# Patient Record
Sex: Female | Born: 1970 | Race: Black or African American | Hispanic: No | Marital: Married | Smoking: Never smoker
Health system: Southern US, Community
[De-identification: ages and names within clinical notes are randomized; demographics above are authoritative.]

## PROBLEM LIST (undated history)

## (undated) DIAGNOSIS — D259 Leiomyoma of uterus, unspecified: Secondary | ICD-10-CM

## (undated) DIAGNOSIS — F419 Anxiety disorder, unspecified: Secondary | ICD-10-CM

## (undated) DIAGNOSIS — F32A Depression, unspecified: Secondary | ICD-10-CM

## (undated) DIAGNOSIS — M7551 Bursitis of right shoulder: Secondary | ICD-10-CM

## (undated) DIAGNOSIS — G43909 Migraine, unspecified, not intractable, without status migrainosus: Secondary | ICD-10-CM

## (undated) DIAGNOSIS — I10 Essential (primary) hypertension: Secondary | ICD-10-CM

## (undated) DIAGNOSIS — R7989 Other specified abnormal findings of blood chemistry: Secondary | ICD-10-CM

## (undated) DIAGNOSIS — F329 Major depressive disorder, single episode, unspecified: Secondary | ICD-10-CM

## (undated) DIAGNOSIS — M539 Dorsopathy, unspecified: Secondary | ICD-10-CM

## (undated) DIAGNOSIS — R7309 Other abnormal glucose: Secondary | ICD-10-CM

## (undated) HISTORY — DX: Bursitis of right shoulder: M75.51

## (undated) HISTORY — DX: Essential (primary) hypertension: I10

## (undated) HISTORY — DX: Dorsopathy, unspecified: M53.9

## (undated) HISTORY — DX: Leiomyoma of uterus, unspecified: D25.9

## (undated) HISTORY — DX: Other specified abnormal findings of blood chemistry: R79.89

## (undated) HISTORY — DX: Depression, unspecified: F32.A

## (undated) HISTORY — DX: Anxiety disorder, unspecified: F41.9

## (undated) HISTORY — DX: Migraine, unspecified, not intractable, without status migrainosus: G43.909

## (undated) HISTORY — DX: Major depressive disorder, single episode, unspecified: F32.9

## (undated) HISTORY — DX: Other abnormal glucose: R73.09

---

## 2000-01-18 ENCOUNTER — Encounter: Payer: Self-pay | Admitting: Obstetrics and Gynecology

## 2000-01-18 ENCOUNTER — Ambulatory Visit (HOSPITAL_COMMUNITY): Admission: RE | Admit: 2000-01-18 | Discharge: 2000-01-18 | Payer: Self-pay | Admitting: Obstetrics and Gynecology

## 2000-04-25 ENCOUNTER — Other Ambulatory Visit: Admission: RE | Admit: 2000-04-25 | Discharge: 2000-04-25 | Payer: Self-pay | Admitting: Obstetrics and Gynecology

## 2000-04-25 ENCOUNTER — Encounter (INDEPENDENT_AMBULATORY_CARE_PROVIDER_SITE_OTHER): Payer: Self-pay | Admitting: Specialist

## 2000-05-13 HISTORY — PX: TUBAL LIGATION: SHX77

## 2001-01-13 ENCOUNTER — Ambulatory Visit (HOSPITAL_COMMUNITY): Admission: RE | Admit: 2001-01-13 | Discharge: 2001-01-13 | Payer: Self-pay | Admitting: Obstetrics and Gynecology

## 2001-01-13 ENCOUNTER — Encounter: Payer: Self-pay | Admitting: Obstetrics and Gynecology

## 2001-01-22 ENCOUNTER — Encounter: Payer: Self-pay | Admitting: Obstetrics and Gynecology

## 2001-01-22 ENCOUNTER — Ambulatory Visit (HOSPITAL_COMMUNITY): Admission: RE | Admit: 2001-01-22 | Discharge: 2001-01-22 | Payer: Self-pay | Admitting: Obstetrics and Gynecology

## 2001-02-16 ENCOUNTER — Other Ambulatory Visit: Admission: RE | Admit: 2001-02-16 | Discharge: 2001-02-16 | Payer: Self-pay | Admitting: Obstetrics and Gynecology

## 2001-03-04 ENCOUNTER — Encounter (INDEPENDENT_AMBULATORY_CARE_PROVIDER_SITE_OTHER): Payer: Self-pay

## 2001-03-04 ENCOUNTER — Inpatient Hospital Stay (HOSPITAL_COMMUNITY): Admission: RE | Admit: 2001-03-04 | Discharge: 2001-03-06 | Payer: Self-pay | Admitting: Obstetrics and Gynecology

## 2001-11-06 ENCOUNTER — Other Ambulatory Visit: Admission: RE | Admit: 2001-11-06 | Discharge: 2001-11-06 | Payer: Self-pay | Admitting: Obstetrics and Gynecology

## 2002-03-22 ENCOUNTER — Other Ambulatory Visit: Admission: RE | Admit: 2002-03-22 | Discharge: 2002-03-22 | Payer: Self-pay | Admitting: Obstetrics and Gynecology

## 2002-05-24 ENCOUNTER — Ambulatory Visit (HOSPITAL_COMMUNITY): Admission: RE | Admit: 2002-05-24 | Discharge: 2002-05-24 | Payer: Self-pay | Admitting: Obstetrics and Gynecology

## 2002-05-24 ENCOUNTER — Encounter (INDEPENDENT_AMBULATORY_CARE_PROVIDER_SITE_OTHER): Payer: Self-pay | Admitting: Specialist

## 2002-10-29 ENCOUNTER — Other Ambulatory Visit: Admission: RE | Admit: 2002-10-29 | Discharge: 2002-10-29 | Payer: Self-pay | Admitting: Obstetrics and Gynecology

## 2003-07-27 ENCOUNTER — Other Ambulatory Visit: Admission: RE | Admit: 2003-07-27 | Discharge: 2003-07-27 | Payer: Self-pay | Admitting: Obstetrics and Gynecology

## 2003-09-12 ENCOUNTER — Emergency Department (HOSPITAL_COMMUNITY): Admission: EM | Admit: 2003-09-12 | Discharge: 2003-09-12 | Payer: Self-pay

## 2004-05-13 HISTORY — PX: MYOMECTOMY: SHX85

## 2004-08-09 ENCOUNTER — Other Ambulatory Visit: Admission: RE | Admit: 2004-08-09 | Discharge: 2004-08-09 | Payer: Self-pay | Admitting: Obstetrics and Gynecology

## 2004-09-27 ENCOUNTER — Ambulatory Visit (HOSPITAL_COMMUNITY): Admission: RE | Admit: 2004-09-27 | Discharge: 2004-09-27 | Payer: Self-pay | Admitting: Family Medicine

## 2005-08-07 ENCOUNTER — Encounter (INDEPENDENT_AMBULATORY_CARE_PROVIDER_SITE_OTHER): Payer: Self-pay | Admitting: Specialist

## 2005-08-07 ENCOUNTER — Inpatient Hospital Stay (HOSPITAL_COMMUNITY): Admission: RE | Admit: 2005-08-07 | Discharge: 2005-08-10 | Payer: Self-pay | Admitting: Obstetrics and Gynecology

## 2005-09-16 ENCOUNTER — Ambulatory Visit (HOSPITAL_COMMUNITY): Admission: RE | Admit: 2005-09-16 | Discharge: 2005-09-16 | Payer: Self-pay | Admitting: Urology

## 2008-02-09 ENCOUNTER — Encounter: Admission: RE | Admit: 2008-02-09 | Discharge: 2008-02-09 | Payer: Self-pay | Admitting: Obstetrics and Gynecology

## 2008-05-13 HISTORY — PX: ABDOMINAL HYSTERECTOMY: SHX81

## 2009-04-19 ENCOUNTER — Encounter: Admission: RE | Admit: 2009-04-19 | Discharge: 2009-04-19 | Payer: Self-pay | Admitting: Obstetrics and Gynecology

## 2009-06-30 ENCOUNTER — Ambulatory Visit (HOSPITAL_COMMUNITY): Admission: RE | Admit: 2009-06-30 | Discharge: 2009-06-30 | Payer: Self-pay | Admitting: Obstetrics and Gynecology

## 2010-08-01 LAB — CBC
HCT: 41.3 % (ref 36.0–46.0)
Hemoglobin: 13.9 g/dL (ref 12.0–15.0)
MCHC: 33.6 g/dL (ref 30.0–36.0)
MCV: 93.7 fL (ref 78.0–100.0)
Platelets: 213 10*3/uL (ref 150–400)
RBC: 4.41 MIL/uL (ref 3.87–5.11)
RDW: 12.7 % (ref 11.5–15.5)
WBC: 8 10*3/uL (ref 4.0–10.5)

## 2010-08-01 LAB — COMPREHENSIVE METABOLIC PANEL
ALT: 36 U/L — ABNORMAL HIGH (ref 0–35)
AST: 31 U/L (ref 0–37)
Albumin: 4 g/dL (ref 3.5–5.2)
Alkaline Phosphatase: 78 U/L (ref 39–117)
BUN: 7 mg/dL (ref 6–23)
CO2: 25 mEq/L (ref 19–32)
Calcium: 9.2 mg/dL (ref 8.4–10.5)
Chloride: 108 mEq/L (ref 96–112)
Creatinine, Ser: 0.76 mg/dL (ref 0.4–1.2)
GFR calc Af Amer: >60 mL/min (ref >60)
GFR calc non Af Amer: >60 mL/min (ref >60)
Glucose, Bld: 96 mg/dL (ref 70–99)
Potassium: 4.2 mEq/L (ref 3.5–5.1)
Sodium: 140 mEq/L (ref 135–145)
Total Bilirubin: 1.6 mg/dL — ABNORMAL HIGH (ref 0.3–1.2)
Total Protein: 7.8 g/dL (ref 6.0–8.3)

## 2010-09-28 NOTE — H&P (Signed)
[facility] [place]  Patient:    Carrie Gordon, Carrie Gordon. Visit Number: [id number] MRN: [id number]          Service Type: Attending:  Debbe Bales A. Edward Jolly, M.D. Dictated by:   Devoria Albe Edward Jolly, M.D. Adm. Date:  03/04/01                           History and Physical  CHIEF COMPLAINT:              Dysmenorrhea and menorrhagia.  HISTORY OF PRESENT ILLNESS:   The patient is a 40 year old gravida 1, para 1 African-American female who has a two year history of symptomatic uterine fibroids unresponsive to medical therapy.  The patient reports symptoms of significant dysmenorrhea and menorrhagia which have not ameliorated with use of oral contraceptive therapy and nonsteroidal anti-inflammatories.  An MRI on January 13, 2001 showed the uterus to be diffusely involved with multiple fibroids and evidence of cystic degeneration.  There is a large pedunculated fibroid along the posterior uterine fundus measuring 5.5 cm in its largest diameter.  There are also several other broad based subserosal and intramural fibroids, the largest subserosal fibroid of which measures up to 4 cm.  There is also a small submucosal fibroid which is less than 1 cm in diameter in the anterior uterine body.  The ovaries are noted to be normal.  A hemoglobin on December 23, 2000 measured 12.4.  The patient desires surgical treatment of the uterine fibroids and she declines hysterectomy.  PAST OBSTETRICAL HISTORY:     Remarkable for a prior spontaneous vaginal delivery 12 years ago.  PAST GYNECOLOGICAL HISTORY:   The patient does have a history of abnormal Pap smears with low grade dysplasia noted in November 2001.  Colposcopy on April 15, 2001 was negative.  A followup Pap smear on February 16, 2001 was within normal limits.  The patient denies a history of sexually transmitted infection.  She has had no prior pelvic surgery.  PAST MEDICAL HISTORY:         Negative.  PAST SURGICAL HISTORY:         Negative.  MEDICATIONS:                  1. Ortho Tri-Cyclen.                               2. Ponstel 250 mg p.o. q.i.d. p.r.n.  ALLERGIES:                    No known drug allergies.  SOCIAL HISTORY:               The patient is a widow.  She denies the use of tobacco, alcohol, IV drugs, or cocaine.  FAMILY HISTORY:               Noncontributory.  REVIEW OF SYSTEMS:            Please refer to history of present illness.  PHYSICAL EXAMINATION  VITAL SIGNS:                  Blood pressure 130/70.  GENERAL:                      She is a young, healthy appearing African-American female in no acute distress.  HEENT:  Normocephalic, atraumatic.  NECK:                         Negative for adenopathy or thyromegaly.  LUNGS:                        Clear to auscultation bilaterally.  HEART:                        S1, S2 with a regular rate and rhythm and no murmur, rub, or gallop.  BREASTS:                      No palpable masses, skin retractions, nipple discharge, or axillary adenopathy.  ABDOMEN:                      Pelvic mass which palpates to be approximately 14 weeks size.  It is nontender.  There is no evidence of hepatosplenomegaly or organomegaly.  PELVIC:                       Normal external genitalia.  The cervix and vagina demonstrate no lesions.  The uterus is noted to be globular and 12-14 weeks size, nontender, and mobile.  No adnexal masses nor tenderness were appreciated.  ASSESSMENT:                   The patient is a 40 year old gravida 1, para 1-0-0-1 African-American female with symptomatic uterine leiomyomata who wishes for surgical evaluation and treatment of the fibroids.  The patient wishes to avoid hysterectomy.  PLAN:                         The patient will undergo diagnostic hysteroscopy with possible hysteroscopic myomectomy and abdominal myomectomy on March 04, 2001 at the [facility].  Risks, benefits, and  alternatives have been discussed with the patient who wishes to proceed.  The patient understands that with myomectomy there may be a small risk of hysterectomy during the procedure.  The patient will be treated with IV cefazolin for antibiotic prophylaxis and she will receive ted hose and PAF stockings for DVT prophylaxis. Dictated by:   Devoria Albe Edward Jolly, M.D. Attending:  Devoria Albe. Edward Jolly, M.D. DD:  03/03/01 TD:  03/03/01 Job: [id number]

## 2010-09-28 NOTE — Consult Note (Signed)
NAMEVIENNE, CORCORAN NO.:  [id number]   MEDICAL RECORD NO.:  [id number]          PATIENT TYPE:  INP   LOCATION:  9313                          FACILITY:  [facility]   PHYSICIAN:  Heloise Purpura, MD      DATE OF BIRTH:  10/29/1970   DATE OF CONSULTATION:  08/08/2005  DATE OF DISCHARGE:                                   CONSULTATION   REFERRING PHYSICIAN:  Randye Lobo, M.D.   REASON FOR CONSULTATION:  Right ureteral obstruction.   HISTORY OF PRESENT ILLNESS:  Ms. Carrie Gordon is a 40 year old female who is  postop day #1, status post a hysterectomy for fibroid disease.  She was  noted to have an increasing serum creatinine from 0.8 up to 1.2 on postop  day #1.  An IVP was performed which demonstrated a significantly delayed  nephrogram on the right side consistent with likely right ureteral  obstruction.  Subsequent delayed films did indicate findings consistent with  a high-grade ureteral obstruction down to the level of the ureteral vesicle  junction.   PAST MEDICAL HISTORY:  1.  Anxiety.  2.  Depression.   PAST SURGICAL HISTORY:  1.  Myomectomy.  2.  Exploratory laparoscopy with lysis of adhesions and bilateral tubal      ligation.   MEDICATIONS:  Xanax 0.5 mg p.r.n.   ALLERGIES:  No known drug allergies.   SOCIAL HISTORY:  The patient denies tobacco use.   FAMILY HISTORY:  No GU malignancy.   PHYSICAL EXAMINATION:  VITAL SIGNS:  The patient is currently afebrile with  stable vital signs.  HEENT:  Normocephalic, atraumatic, oropharynx clear.  CARDIAC:  Regular rate and rhythm without obvious murmurs.  LUNGS:  Clear bilaterally.  ABDOMEN:  Soft, nontender, nondistended.  The patient has dressing over her  Pfannenstiel incision.  BACK:  No CVA tenderness, although the patient has recently been  administered intravenous pain medication.  GENITALIA:  Normal female genitalia.  EXTREMITIES:  No edema.  NEUROLOGIC:  Grossly intact.  SKIN:  Warm and dry.   REVIEW OF SYSTEMS:  All systems were reviewed and were negative except as in  the history of present illness.   IMPRESSION:  Right ureteral obstruction, status post abdominal hysterectomy.   RECOMMENDATIONS:  I recommend that Ms. Carrie Gordon undergo a cystoscopy,  retrograde pyelography with possible ureteral stent placement.  I have  discussed the risks and benefits of this procedure with the patient and her  family.  I told them that if we cannot place a stent that she may require  percutaneous nephrostomy tube tomorrow.  She will be taken to the operating  room at [facility] for the previously mentioned procedures this afternoon.           ______________________________  Heloise Purpura, MD  Electronically Signed     LB/MEDQ  D:  08/08/2005  T:  08/09/2005  Job:  [id number]

## 2010-09-28 NOTE — Op Note (Signed)
Carrie Gordon, Carrie Gordon              ACCOUNT NO.:  [id number]   MEDICAL RECORD NO.:  [id number]          PATIENT TYPE:  INP   LOCATION:  9313                          FACILITY:  [facility]   PHYSICIAN:  Randye Lobo, M.D.   DATE OF BIRTH:  [birth year]   DATE OF PROCEDURE:  DATE OF DISCHARGE:                                 OPERATIVE REPORT   PREOPERATIVE DIAGNOSIS:  Symptomatic uterine fibroids.   POSTOPERATIVE DIAGNOSIS:  Symptomatic uterine fibroids.   SURGEON:  Conley Simmonds, M.D.   ASSISTANT:  Annamaria Helling M.D.   PROCEDURE:  Total abdominal hysterectomy.   IV FLUIDS:  3150 mL Ringer's lactate.   ESTIMATED BLOOD LOSS:  500 mL   URINE OUTPUT:  150 mL.   COMPLICATIONS:  None.   INDICATIONS FOR PROCEDURE:  The patient is a 40 year old para 1 African-  American female status post abdominal myomectomy in 2002 and status post  bilateral tubal ligation with lysis of adhesions and LEEP procedure for an  unsatisfactory colposcopy for low grade dysplasia in 2004, who now presents  with heavy menses and painful periods which have not responded to oral  contraceptive therapy and nonsteroidal anti-inflammatory medication.  On  ultrasound, the patient has multiple fibroids, one of which is partially  submucosal. The ovaries were normal.  The patient declines any future  childbearing and desires to have definitive surgical treatment for her  fibroids.  A plan is made to proceed with a total abdominal hysterectomy  after risks, benefits, and alternatives are discussed with her.   FINDINGS:  Laparotomy demonstrates a 12 weeks' size uterus with intramural  and subserosal fibroids.  The fallopian tubes are consistent with tubal  ligation.  The ovaries were adherent to the uterine fundus bilaterally but  more so on the patient's left-hand side.  There was no evidence of any  endometriosis in the pelvis.   SPECIMEN:  The uterus was sent to pathology.  The uterine fundus and the  cervix were  removed separately and sent together.   PROCEDURE:  The patient was reidentified in the preoperative hold area.  She  received Ancef 1 gram IV for antibiotic prophylaxis and she received both  PAS stockings and TED hose for DVT prophylaxis.   In the operating room, general endotracheal anesthesia was induced.  The  abdomen and vagina were sterilely prepped and a Foley catheter was placed in  the bladder.  She was then sterilely draped.   A Pfannenstiel incision was created sharply with the scalpel.  This was  carried down to the fascia using a scalpel and monopolar cautery.  The  fascia was then incised in the midline and the incision was extended  bilaterally with the Mayo scissors.  The rectus muscles were dissected off  of the overlying fascia superiorly and inferiorly and monopolar cautery was  used to create hemostasis during this dissection.  Hemostat clamps, elevated  the parietoperitoneum which was entered sharply with the Metzenbaum  scissors.  The incision was then extended cranially and caudally using the  same.   A self-retaining retractor was placed inside the peritoneal cavity  and the  bowel was packed into the upper abdomen.  Long Kelly clamps were placed  across the adnexal structures and the round ligaments bilaterally.  The  right round ligament was secured with a transfixing suture of 0 Vicryl.  The  ligament was then divided using monopolar cautery.  The broad ligament was  opened anteriorly and posteriorly.  A window was created through the  posterior leaf of the broad ligament and a Heaney clamp was placed across  the utero-ovarian ligament.  The pedicle was sharply divided and it was then  secured with a free tie of 0 Vicryl followed by suture ligature of the same.  The same procedure that was performed on the right round, broad, and  uterovarian ligaments was then repeated on the left side.  The bladder flap  was then sharply created with the Metzenbaum  scissors.  The uterine arteries  were clamped with Heaney clamps, sharply divided, and suture ligated with 0  Vicryl bilaterally.  The uterine fundus was amputated from the surrounding  cervix in order to improve visualization in the pelvis.   The inferior aspects of the cardinal ligaments were then clamped, sharply  divided, and suture ligated with 0 Vicryl.  The uterosacral ligaments were  then grasped with curved Heaney clamps, sharply divided, and again suture  ligated with 0 Vicryl.  The vagina was entered at this time and the cervix  was circumscribed from the surrounding vagina.  The specimens were then sent  to pathology.  The vaginal cuff was then grasped with Kocher clamps and  angle sutures were placed using 0 Vicryl.  The vagina was then closed with a  running locked suture of 0 Vicryl.   The pelvis was then irrigated and suctioned at this time.  There was some  bleeding noted along the vaginal apices bilaterally.  On the left-hand side  there were a figure-of-eight sutures of 3-0 Vicryl which were placed which  did not create good hemostasis and finally a figure-of-eight suture of 0  Vicryl was placed to create good the hemostasis.  A figure-of-eight suture  of 0 Vicryl was similarly placed close to the right angle and again inside  the uterine artery pedicle.  An additional figure-of-eight suture was placed  in the midline of the vagina which was oozing as well.  At termination of  placement of the sutures hemostasis was noted to be good.  The left ovary  was then tacked to the left round ligament using a figure-of-eight suture of  2-0 Vicryl.  There was a small amount of bleeding noted along the  mesosalpinx which responded to a figure-of-eight suture of the 2-0 Vicryl.   The pelvis was irrigated one final time and reexamined.  There was some  bleeding noted along the right mesosalpinx and this too was treated with a figure-of-eight suture of 2-0 Vicryl.  Hemostasis in the  pelvis was then  noted to be good.  The abdominal retractor and the lap pads were removed  from the abdomen at this time and the abdomen was closed.   The peritoneum was closed with a running suture of 2-0 Vicryl.  The rectus  muscles were reapproximated with figure-of-eight sutures of 0 Vicryl.  The  fascia was closed with a running suture of 0 Vicryl.  The subcutaneous  tissue was undermined so that there would not be any puckering along the  patient's Pfannenstiel incision.  The subcutaneous layer was then irrigated  and suctioned and made hemostatic  with monopolar cautery.  The skin was  closed with staples.   A pressure dressing was placed over the patient's incision.  This concluded  her surgery.  All needle, instrument, sponge counts were correct.  The  patient is awakened and extubated and escorted to the recovery room in  stable and awake condition.      Randye Lobo, M.D.  Electronically Signed     BES/MEDQ  D:  08/07/2005  T:  08/09/2005  Job:  [id number]

## 2010-09-28 NOTE — Discharge Summary (Signed)
NAMERANDOLPH, SANDOVAL              ACCOUNT NO.:  [id number]   MEDICAL RECORD NO.:  [id number]          PATIENT TYPE:  INP   LOCATION:  9313                          FACILITY:  [facility]   PHYSICIAN:  Randye Lobo, M.D.   DATE OF BIRTH:  Apr 05, 1971   DATE OF ADMISSION:  08/07/2005  DATE OF DISCHARGE:  08/10/2005                                 DISCHARGE SUMMARY   ADMISSION DIAGNOSES:  Symptomatic uterine leiomyomata.   DISCHARGE DIAGNOSES:  1.  Symptomatic uterine leiomyomata.  2.  Status post total abdominal hysterectomy.  3.  Right ureteral obstruction.  4.  Status post cystoscopy with right ureteral stent placement and right      retrograde pyelogram.   PROCEDURES:  1.  The patient underwent a total abdominal hysterectomy at the [facility] [place] on August 07, 2005, under the direction of Dr.      Conley Simmonds with the assistance of Dr. Annamaria Helling.  The patient's      surgery was complicated by postoperative diagnosis of right ureteral      obstruction.  2.  The patient underwent a cystoscopy with right ureteral stent placement      and right retrograde pyelogram on August 08, 2005, under the direction of      Dr. Crecencio Mc at [facility].   HISTORY OF PRESENT ILLNESS:  The patient is a 40 year old, P74, African-  American female, status post abdominal myomectomy in 2002, and status post  bilateral tubal ligation with lysis of adhesions and LEEP procedure for an  unsatisfactory colposcopy with low-grade dysplasia in 2004, who presented  with heavy menstruation and painful periods which was not responsive to oral  contraceptive therapy and nonsteroidal anti-inflammatory medicines.  The  patient on ultrasound had multiple fibroids, one of which was partially  submucosal.  The patient wished for definitive surgical treatment of her  fibroids and she declined future childbearing.  A plan was made for  admission for total abdominal hysterectomy after risks,  benefits and  alternatives were discussed with her.   HOSPITAL COURSE:  The patient was admitted on August 07, 2005, at which time  she underwent a total abdominal hysterectomy under the direction of Dr.  Conley Simmonds with the assistance of Dr. Annamaria Helling.  Estimated blood loss  from surgery was 500 mL.  The uterus was noted to be approximately 12 weeks  size and with intramural and subserosal fibroids.  The fallopian tubes were  consistent with bilateral tubal ligation and the ovaries were adherent to  the uterine fundus bilaterally, but more so on the patient's left side.  There was no evidence of any endometriosis and pelvis.   Postoperatively, the patient did well overnight following her surgery.  She  was comfortable with a morphine PCA.  She had stable vital signs and was  producing good amounts of clear yellow urine.  A basic metabolic profile was  ordered for postop day #1.  Her creatinine measured 1.2.  Her preoperative  creatinine was found to be 0.8.  Although the patient was essentially  asymptomatic, there was some concern over the rise in her creatinine and an  IVP was ordered to rule out any ureteral obstruction due to intraoperative  bleeding that the patient had along the vaginal cuff requiring some suturing  near the apices.  The patient underwent her IVP study and she was noted to  have a right ureteral obstruction.  Urologic consultation was performed with  Dr. Crecencio Mc and the patient was transferred to [facility] where  she underwent a cystoscopy with a right retrograde pyelogram and a right  ureteral stent placement.  The obstruction was relieved rapidly and easily  suggesting a possible kinking of the right ureter.   Following the patient's stent placement, she had a low-grade temperature to  100.8.  This was attributed to atelectasis which was noted in her left lower  lobe.  The patient had not done any significant ambulation at this point and  she  was encouraged to begin walking and to use her incentive spirometer.  Her fever did defervesce.  The patient had no other fevers throughout her  hospitalization.  The patient did receive Ancef prophylaxis for both her  hysterectomy and for her cystoscopy and stent placement..  The patient's  incision remained clean, dry and intact throughout the hospitalization.  Her  white blood cell count at discharge was noted to be 7.7.   The patient's diet was slowly advanced to normal.  She was converted over to  Percocet and ibuprofen which she is tolerating well at the time of her  discharge. The patient was tolerating a regular diet at the time of her  discharge.  The patient is voiding without difficulty after Foley catheter  was removed.   The patient's discharge hemoglobin is 9.6 and she is tolerating this well.   Her final pathology report is pending at the time for discharge.   DISPOSITION:  Discharged to home.   DISCHARGE MEDICATIONS:  1.  Percocet 5 mg/325 mg one to two p.o. q.4-6 hours p.r.n. pain.  2.  Ibuprofen 600 mg p.o. q.6h. p.r.n. pain.  3.  Multivitamin with iron one p.o. q.d.  4.  The patient has also received a prescription for ciprofloxacin, per Dr.      Laverle Patter, which she is to begin taking 1 day prior to her return      appointment with him in 6 weeks.   DIET:  The patient will follow a regular diet.   ACTIVITY:  The patient will have decreased activity.   FOLLOW UP:  The patient will follow up in the office in 10 days.   SPECIAL INSTRUCTIONS:  The patient will call if she experiences fever  greater than or equal to 100.5 degrees Fahrenheit, nausea and vomiting,  increased pain, increased bleeding, right back pain, pain or bleeding or  difficulty with urination or any other concern.      Randye Lobo, M.D.  Electronically Signed     BES/MEDQ  D:  08/10/2005  T:  08/10/2005  Job:  [id number]

## 2010-09-28 NOTE — Op Note (Signed)
[facility]  Patient:    Carrie Gordon, Carrie Gordon Visit Number: [id number] MRN: [id number]          Service Type: GYN Location: [id number] Attending Physician:  Melony Overly Dictated by:   Devoria Albe Edward Jolly, M.D. Proc. Date: 03/04/01 Admit Date:  03/04/2001                             Operative Report  PREOPERATIVE DIAGNOSES:       Symptomatic uterine leiomyomata.  POSTOPERATIVE DIAGNOSES:      Symptomatic uterine leiomyomata.  PROCEDURE:                    Diagnostic hysteroscopy, abdominal myomectomy.  SURGEON:                      Brook A. Edward Jolly, M.D.  ASSISTANT:                    Gerrit Friends. Aldona Bar, M.D.  ANESTHESIA:                   General endotracheal.  INTRAVENOUS FLUIDS:           2400 cc Ringers lactate.  ESTIMATED BLOOD LOSS:         400 cc.  URINE OUTPUT:                 800 cc.  COMPLICATIONS:                None.  INDICATIONS FOR PROCEDURE:    The patient was a 40 year old gravida 1, para 1 African-American female with a two year history of dysmenorrhea and menorrhagia and known uterine fibroids.  An MRI performed on January 13, 2001 showed the uterus to be diffusely involved with multiple fibroids along with evidence of cystic degeneration.  There were pedunculated, subserosal, myometrial, as well as one small submucosal fibroid which was less than 1 cm in diameter.  The patient did not respond to medical therapy and wished for surgical evaluation and treatment of the fibroids.  The patient agreed to proceed with her surgery after the risks, benefits, and alternatives were discussed with her.  FINDINGS:                     Examination under anesthesia revealed a 12-14 week sized globular uterus.  No adnexal masses were appreciated.  Hysteroscopy demonstrated no evidence of any intracavitary fibroids or polyps.  Laparotomy documented multiple uterine fibroids.  There was a left superior fundal 5 cm pedunculated and calcified  fibroid.  There was a left superior fundal 3 cm subserosal fibroid.  There were multiple fundal subserosal fibroids which measured 0.75 cm in diameter.  There were multiple fundal myometrial fibroids which measured 0.75-3 cm in diameter.  The fallopian tubes and ovaries were noted to be normal.  SPECIMEN:                     18 fibroids were sent to pathology together.  PROCEDURE:                    With an IV in place the patient was taken to the operating room after she was properly identified.  She received cefazolin 1 g intravenously for antibiotic prophylaxis and she wore both thigh high ted hose and PAF stockings for DVT prophylaxis.  The patient was placed in the supine position on the operating room table and general endotracheal anesthesia was induced.  She was then placed in the dorsal lithotomy position and the abdomen and vagina were sterilely prepped and draped.  A Foley catheter was sterilely placed inside the bladder.  An examination under anesthesia was performed.  The speculum was then placed inside the vagina.  The single tooth tenaculum was placed on the anterior cervical lip.  The uterus was sounded and the cervix was then dilated with Shawnie Pons dilators up to a #19.  The diagnostic hysteroscopy was performed under continuous fusion of 3% Sorbitol and the findings are as noted above.  The fluid deficit from the hysteroscopy was 10 cc total.  All of the instruments had been removed from the vagina and the abdominal myomectomies were performed.  A Pfannenstiel incision was created sharply with a scalpel.  This was carried down to the fascia using monopolar cautery.  The fascia was then scored with the scalpel.  The incision was carried out bilaterally using Mayo scissors. The rectus muscles were dissected off of the fascia and the parietoperitoneum was exposed.  It was grasped with two snap clamps and entered sharply with the Metzenbaum scissors.  The incision was  extended cranially and caudally.  The uterus was elevated through the abdominal incision.  The findings are as noted above.  The procedure began with injection of vasopressin 10 units and 50 cc of normal saline.  The serosa and subserosal tissue were injected at this time in a vertical fashion along the level of the uterine fundus and posteriorly.  Monopolar cautery was then used to create a vertical incision which extended from the top of the fundus posteriorly on the fundus.  This was carried down to the level of the large myometrial fibroid which was grasped with a towel clip and then dissected sharply with Metzenbaum scissors. Several other small myometrial fibroids were removed through this incision with a similar technique.  It was possible to grasp the left fundal subserosal fibroid through this incision as well and the myoma was similarly dissected and removed.  The pedunculated fibroid was then grasped at its base with a Kelly clamp and was sharply excised with scissors.  A transfixing suture of 0 Vicryl was placed at the base of this fibroid for good hemostasis.  The anterior fundus was then injected with, again, diluted vasopressin and a small vertical incision was created along the anterior fundus for removal of additional fibroids in this region using a similar technique described.  The small subserosal fibroids which were scattered across the uterine fundus were next excised by either sharp dissection or by using monopolar cautery.  There was a suggestion of a fibroid appreciated in the mid portion of the myometrium in between the fallopian tubes bilaterally.  This was small measuring approximately 0.75 cm and there was concern over injury to fallopian tubes for its removal.  It was therefore left alone.  At this time the uterine incisions were all closed.  There was evidence of possible entry into the endometrial cavity through the posterior fundal incision.  This was  closed with a running suture of 3-0 Vicryl.  The remainder of the myometrium of all of the incisions were then closed with  figure-of-eight sutures of 0 Vicryl in layers.  The serosal incisions were closed with subcuticular sutures of 3-0 Vicryl.  As there was oozing along several of the uterine incisions, figure-of-eight sutures of 0 Vicryl were placed  which provided good hemostasis.  The peritoneal cavity was then irrigated and suctioned.  Hemostasis at this time was noted to be adequate; however, it was difficult to create a completely dry surgical field for adequate placement of intercede.  Therefore, intercede was ultimately not placed.  The abdomen was closed.  A running suture of 3-0 Vicryl was placed to close the peritoneum.  The rectus muscles were brought together in the midline with a figure-of-eight suture of 0 Vicryl.  The fascia was closed with a running suture of 0 Vicryl and the skin was closed with a subcuticular suture of 3-0 Vicryl followed by Steri-Strips and benzoin.  A pressure dressing was placed over this.  The patient was then cleansed of any remaining Betadine.  She was taken out of the dorsal lithotomy position, awakened, and escorted to recovery in stable and awake condition.  All needle, instrument, and lap counts were correct. Dictated by:   Devoria Albe Edward Jolly, M.D. Attending Physician:  Melony Overly DD:  03/04/01 TD:  03/05/01 Job: [id number]

## 2010-09-28 NOTE — H&P (Signed)
Carrie Gordon, Carrie Gordon              ACCOUNT NO.:  [id number]   MEDICAL RECORD NO.:  [id number]          PATIENT TYPE:  INP   LOCATION:  NA                            FACILITY:  [facility]   PHYSICIAN:  Randye Lobo, M.D.   DATE OF BIRTH:  Jan 15, 1971   DATE OF ADMISSION:  DATE OF DISCHARGE:                                HISTORY & PHYSICAL   DATE OF PREOPERATIVE HISTORY AND PHYSICAL EXAMINATION:  July 22, 2005.   CHIEF COMPLAINT:  Heavy and painful menstrual periods.   HISTORY OF PRESENT ILLNESS:  The patient is a 40 year old, para 1, African  American female, status post bilateral tubal ligation in 2004, and status  post abdominal myomectomy, in 2002, who presents with heavy and painful  menstrual cycles.  The patient requires both the use of a pad and tampon to  avoid staining through her clothing due to the heaviness of her cycles.  The  patient reports that she does miss work due to her periods at least one day  per month.  She can use an entire bottle of Advil with each menstrual cycle  to control cramping.  The patient has been placed on oral contraceptive  pills and she continues to have irregular bleeding.  Her last hemoglobin was  11.3, on June 06, 2005, and she has been treated with a multivitamin with  iron.  Most recent ultrasound, on July 03, 2005, documented multiple  uterine fibroids ranging in size from 1.09 to 3.9-cm.  There was one fibroid  that was noted to be partially submucosal.  The endometrial stripe was 1.70-  cm.  The ovaries were normal.  At this point, the patient wishes for a  definitive surgical treatment of her fibroids and she declines any future  childbearing.   PAST OBSTETRIC/GYNECOLOGIC HISTORY:  1.  Status post vaginal delivery x1.  2.  She is status post abdominal myomectomy in 2002.  3.  Status post open laparoscopy with lysis of adhesions, bilateral tubal      ligation, and a LEEP procedure in 2004.  4.  History of low grade dysplasia and  underwent an unsatisfactory      colposcopy which prompted the LEEP procedure.  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  5.  Her last Pap smear was performed, on August 09, 2004, and showed atypical      cells of undetermined significance, and she had negative high risk HPV      testing.  6.  The patient also has a history of [employer] syndrome which was      diagnosed at the time of her laparoscopy in 2004.   PAST MEDICAL HISTORY:  The patient suffers from anxiety and depression and  takes Xanax on a p.r.n. basis.   PAST SURGICAL HISTORY:  1.  Status post abdominal myomectomy, 2002.  2.  Status post open laparoscopy with lysis of adhesions, bilateral tubal      ligation, and LEEP procedure in 2004.   MEDICATIONS:  Xanax 0.5 mg p.r.n.   ALLERGIES:  No known  drug allergies.   SOCIAL HISTORY:  The patient is a widow.  She lost her husband to suicide.  The patient is currently in a stable and longstanding relationship.  She  works for [employer].  She denies the use of tobacco.   FAMILY HISTORY:  Noncontributory.   PHYSICAL EXAMINATION:  VITAL SIGNS:  Height 5 feet 4 inches, weight 152  pounds.  HEENT:  Normocephalic atraumatic.  LUNGS:  Clear to auscultation bilaterally.  HEART:  S1 S2 with a regular rate and rhythm.  No evidence of a murmur, rub,  or gallop.  ABDOMEN:  There is a well healed Pfannenstiel incision without evidence of  herniation.  The abdomen is soft and nontender without evidence of  hepatosplenomegaly or organomegaly.  PELVIC:  Documents a globular and enlarged uterus which is tender to  palpation.  Uterine size is approximately seven weeks' size.  No adnexal  masses are appreciated.   IMPRESSION:  The patient is a 40 year old para 1 female status post  bilateral tubal ligation and status post abdominal myomectomy who has  symptomatic uterine fibroids.   PLAN:  The patient will undergo a total abdominal hysterectomy at the   [facility] [place] on August 07, 2005.  Risks, benefits, and  alternatives have been discussed with the patient who wishes to proceed.      Randye Lobo, M.D.  Electronically Signed     BES/MEDQ  D:  08/06/2005  T:  08/06/2005  Job:  [id number]

## 2010-09-28 NOTE — Op Note (Signed)
Carrie Gordon, Carrie Gordon              ACCOUNT NO.:  [id number]   MEDICAL RECORD NO.:  [id number]          PATIENT TYPE:  AMB   LOCATION:  DAY                          FACILITY:  [facility]   PHYSICIAN:  Heloise Purpura, MD      DATE OF BIRTH:  12/24/1970   DATE OF PROCEDURE:  08/08/2005  DATE OF DISCHARGE:  08/08/2005                                 OPERATIVE REPORT   PREOPERATIVE DIAGNOSIS:  Right ureteral obstruction.   POSTOPERATIVE DIAGNOSIS:  Right ureteral obstruction.   PROCEDURE:  1.  Cystoscopy.  2.  Right retrograde pyelography.  3.  Right ureteral stent placement (6 x 26).   SURGEON:  Leeroy Bock, M.D.   ANESTHESIA:  General.   COMPLICATIONS:  None.   RADIOLOGICAL FINDINGS:  A retrograde pyelogram was performed.  This  demonstrates a dilated ureter down to the level of the ureterovesical  junction.  However, no extravasation or fixed filling defects were  identified.   INDICATIONS:  Ms. [name] is a 40 year old female who is postoperative day #1  after a hysterectomy.  On postoperative day #1, her creatinine was noted to  be increased from the 0.8 to 1.2.  An IVP was performed to assess for ureteral obstruction.  There is noted to  be a delayed nephrogram on the right side.  Subsequent films demonstrated  findings consistent with a high-grade obstruction on the right side down to  the level of the ureterovesical junction.  A urology consultation was  therefore obtained.  After discussion with the patient, it was decided to  proceed with the above procedures both for diagnostic and potential  therapeutic purposes.  Potential risks and benefits of this procedure were  discussed with the patient and she consented.   DESCRIPTION OF PROCEDURE:  The patient was taken to the operating room and a  general anesthetic was administered.  She was placed in the dorsal lithotomy  position, prepped and draped in the usual sterile fashion.  Preoperative  antibiotics were administered.   Next, cystourethroscopy was performed.  This  demonstrated a normal urethra.  Examination of the bladder demonstrated the  ureteral orifices to be in the normal anatomic position.  The right ureteral  orifice did appear somewhat edematous.  The remainder of the bladder other  than a little erythema likely secondary to the patient's indwelling Foley  catheter did not demonstrate any mucosal abnormalities.  A 0.38 guidewire  was then inserted into the right ureteral orifice and a 6-French ureteral  catheter  was advanced over the wire into the distal right ureter.  The wire  was then removed, and a retrograde pyelogram was performed.  Findings  demonstrated a dilated ureter down to the level of the ureterovesical  junction but no fixed filling defects or extravasation.  A 0.038 guidewire  was then inserted up in the renal pelvis under fluoroscopic guidance which  passed without difficulty.  A 6 x 26 double-J ureteral stent was then  advanced over the wire and was appropriately positioned under fluoroscopic  and cystoscopic guidance.  The wire was then removed with a good curl noted  in the  renal pelvis as well as the bladder.  A 16-French Foley catheter was then  inserted into the bladder.  The patient appeared to tolerate procedure well  without complications.  She was able to be extubated and transferred to the  recovery in satisfactory condition.           ______________________________  Heloise Purpura, MD  Electronically Signed     LB/MEDQ  D:  08/08/2005  T:  08/10/2005  Job:  [id number]

## 2010-09-28 NOTE — Op Note (Signed)
NAME:  Carrie Gordon, Carrie Gordon                        ACCOUNT NO.:  [id number]   MEDICAL RECORD NO.:  [id number]                   PATIENT TYPE:  AMB   LOCATION:  SDC                                  FACILITY:  [facility]   PHYSICIAN:  Randye Lobo, M.D.                DATE OF BIRTH:  05/07/1971   DATE OF PROCEDURE:  05/24/2002  DATE OF DISCHARGE:                                 OPERATIVE REPORT   PREOPERATIVE DIAGNOSES:  1. Desire for permanent sterilization.  2. Abnormal Pap smear with unsatisfactory colposcopy.   POSTOPERATIVE DIAGNOSES:  1. Desire for permanent sterilization.  2. Abnormal Pap smear, unsatisfactory colposcopy.  3. Pelvic adhesions.  4. Fitz-Hugh-Curtis syndrome.   PROCEDURE:  Open laparoscopy with lysis of adhesions and bilateral tubal  ligation via bipolar cautery, LEEP procedure.   SURGEON:  Randye Lobo, M.D.   ANESTHESIA:  General endotracheal, local cervical injection.   IV FLUID TOTAL:  2,100 cc Ringer's lactate.   ESTIMATED BLOOD LOSS:  Minimal.   URINE OUTPUT:  150 cc.   COMPLICATIONS:  None.   INDICATIONS FOR PROCEDURE:  The patient is a 40 year old gravida 1, para 1,  African-American female, who presented to the office requesting permanent  sterilization.  The patient was unsatisfied with reversible contraception  and chose to proceed with a tubal ligation.  On the patient's most recent  Pap smear, she was noted to have low-grade SIL.  She did undergo a  colposcopy, which was unsatisfactory.  Biopsies of the exocervix and the  endocervix were negative.   The patient's past gynecologic history was remarkable for an abdominal  myomectomy in 2002.  The patient wished to proceed with surgical treatment  of the abnormal Pap smear and with permanent sterilization after the risks,  benefits, and alternatives were discussed with her.  The patient was  counseled about a failure rate of the procedure in approximately 1 in 250 to  1 in 300, which may  result in either an intrauterine or ectopic pregnancy.   FINDINGS:  At the time of the LEEP procedure, there were no gross lesions  appreciated on the cervix.  Laparoscopy demonstrated adhesions between the anterior uterine fundus and  the bladder, between the posterior fundus and the omentum.  There were also  adhesions of the left fallopian tube to the left ovary and the right  fallopian tube to the broad ligament on that same side.  There was a 1 cm  right cornual posterior subserosal fibroid.  There were no other visible  fibroids.  The ovaries had a normal appearance bilaterally.   In the upper abdomen, there was evidence of Fitz-Hugh-Curtis syndrome as  adhesions between the liver and the anterior abdominal wall.   SPECIMEN:  The LEEP cone specimen was sent to pathology.   DESCRIPTION OF PROCEDURE:  With an IV in place, the patient was taken to the  operating room after  she was properly identified.  The patient did receive  Ancef 1 gm intravenously for antibiotic prophylaxis.  In the operating room,  general endotracheal anesthesia was induced, and the patient was then placed  in the dorsal lithotomy position. The abdomen was sterilely prepped, and a  Foley catheter was sterilely placed inside the urinary bladder.  The patient  was sterilely draped.   A speculum was placed inside the vagina, and acetic acid was applied to the  cervix.  There were no gross lesions appreciated at this time.  Lugol's was  then applied to the cervix.  A small transformation zone was appreciated.  The cervix was then circumferentially injected with 1% lidocaine with  1:200,000 of epinephrine.  The exocervix was removed as two specimens.  The  first specimen included the majority of the  exocervix.  The second specimen  was the specimen from 3 o'clock to 6 o'clock, which had not been adequately  removed with the first pass of the loop.  The specimens were marked on a  cork board.  A cowboy hat was  performed with a square-shaped loop, such that  the endocervical specimen was removed.  This was sent to pathology with the  other specimen.  A cautery ball was then used to coagulate the base of the  surgical site and along the edges.  Hemostasis was noted to be excellent.  The endocervix was not coagulated.  These instruments were then removed from  the vagina, and the vagina and cervix were then sterilely prepped with  Betadine, and a Hulka tenaculum was placed inside the uterus.   The laparoscopy was performed next.  Allis clamps were used to elevate the  infraumbilical skin, which was incised with a scalpel.  This was carried  down to the fascia using blunt dissection with a Kelly clamp.  The fascia  was then grasped with two Kocher clamps, and a transverse incision was  created along the abdominal wall fascia. The parietal peritoneum was then  grasped and elevated with two snap clamps and was entered sharply.  A suture  of 0 Vicryl was then used to anchor the fascia to the Hasson cannula, which  was placed inside the peritoneal cavity.   The patient was placed in Trendelenburg after a pneumoperitoneum was  achieved with CO2 gas.  An inspection of the pelvic and abdominal organs was  performed, and the findings were as noted above.  A 5 mm left lower quadrant  incision was created just lateral to the patient's previous Pfannenstiel  incision.  A 5 mm trocar was placed under direct visualization of the  laparoscope.  A blunt-tipped probe was then placed for further evaluation of  the patient's anatomy.  The adhesions between the uterine fundus and the  bladder and the posterior uterine fundus and the omentum were then sharply  lysed after coagulation with bipolar cautery.  This provided 100% mobility  of the uterus.  The left fallopian tube was then identified and followed all the way to its fimbriated end.  The isthmic portion of the left fallopian  tube was then grasped and was  coagulated using bipolar cautery.  A 2-3 cm  segment of the fallopian tube was sequentially clamped and cauterized with  the bipolar cautery.  Attention was then turned to the right fallopian tube  which was also grabbed along its isthmic portion with the Kleppinger  forceps.  Again, bipolar cautery was applied, and a 2 cm length of the  fallopian  tube on this side was then sequentially cauterized for excellent  blanching of the tissue.   The pneumoperitoneum was partially released, and all of the operative sites  were re-examined.  Hemostasis was excellent.  The left lower quadrant trocar  was removed under visualization of the laparoscope.  The pneumoperitoneum  was then released, and the Hasson cannula and the 10 mm laparoscope were  removed simultaneously.   The left lower quadrant incision was closed with subcuticular sutures of 3-0  plain.  The umbilical incision was closed with additional figure-of-eight  sutures of 0 Vicryl for excellent closure of the fascia.  There was some  puckering noted of the skin in this region, and the subcutaneous tissue was  released from the overlying skin and then cauterized at the base for  excellent hemostasis.  The skin was then closed with a subcuticular suture  again of 3-0 plain.  Steri-Strips were placed, followed by bandages.  The  patient was cleansed of any remaining Betadine.  The Foley catheter and the  Hulka tenaculum were removed as well.   The patient was awakened and escorted to the recovery room in a stable and  awake condition.  There were no complications to the procedure.  All needle,  instrument, and sponge counts were correct.                                               Randye Lobo, M.D.    BES/MEDQ  D:  05/24/2002  T:  05/24/2002  Job:  [id number]

## 2010-09-28 NOTE — Discharge Summary (Signed)
[facility]  Patient:    Carrie Gordon, Carrie Gordon Visit Number: [id number] MRN: [id number]          Service Type: GYN Location: [id number] Attending Physician:  Melony Overly Dictated by:   Devoria Albe Edward Jolly, M.D. Admit Date:  03/04/2001 Discharge Date: 03/06/2001                             Discharge Summary  ADMISSION DIAGNOSIS:          Symptomatic uterine leiomyomata.  DISCHARGE DIAGNOSES:          1. Symptomatic uterine leiomyomata.                               2. Status post diagnostic hysteroscopy and                                  abdominal myomectomy.  SIGNIFICANT OPERATIONS AND PROCEDURES:               The patient underwent a diagnostic hysteroscopy with abdominal myomectomies on March 04, 2001 under the direction of Dr. Conley Simmonds and with the assistance of Dr. Annamaria Helling at the [facility].  PERTINENT HISTORY AND PHYSICAL EXAMINATION:         The patient was a 40 year old, gravida 1, para 1, African-American female with a two-year history of symptomatic uterine leiomyomata which had been unresponsive to oral contraceptive therapy and nonsteroidal anti-inflammatories. An MRI preoperatively documented multiple uterine leiomyomata including pedunculated, subserosal, intramural, and a submucosal fibroid (less than 1 cm in diameter). The patient wished for surgical evaluation and treatment of the fibroids. She declined hysterectomy.  PHYSICAL EXAMINATION:         Physical examination did document, on pelvic exam, a 12 to 15-week globular, nontender mobile uterus. No adnexal masses were appreciated.  HOSPITAL COURSE:              The patient was admitted on March 04, 2001 at which time she underwent a diagnostic hysteroscopy with abdominal myomectomies. The hysteroscopy documented no intracavitary fibroids or polyps. Operative findings documented a large pedunculated fibroid in addition to multiple subserosal and intramural  fibroids. The estimated blood loss from surgery was 400 cc, and there were no complications.  Postoperatively, the patient had an uneventful recovery. In the first 24 hours postoperatively, the patient was noted to have a low grade temperature to 100.1. This did defervesce at the time of her discharge. The patient was treated with Ancef 1 g intravenously q.8h. during her postoperative course. The patients postoperative day #1 CBC (hemoglobin) was 9.3.  The patient initially was treated with a morphine PCA which was converted to oral Percocet and Motrin which adequately controlled her pain.  The patients diet was slowly advanced to regular with flatus, and she will take a regular diet for breakfast just prior to her discharge.  The patient was able to ambulate independently. She did receive TED hose and PAS compression stockings for DVT prophylaxis while she was hospitalized.  FINAL PATHOLOGY:              The final pathology report is pending at the patients discharge. Her postoperative day #1 hematocrit was 27.9%, she was tolerating this well.  Her incision remained clean, dry, and intact. The patient was found  to be in good condition and ready for discharge on postoperative day #2.  DISCHARGE INSTRUCTIONS:       1. Discharge to home.                               2. Regular diet.                               3. Iron sulfate 325 mg p.o. b.i.d.                               4, The patient received a prescription for                                  both Percocet one to two p.o. q.4-6h. prn                                  and for Motrin 600 mg p.o. q.h. p.r.n.                               5. The patient will have decreased activity for                                  six weeks.                               6. The patient will follow up in four weeks                                  time                               7. The patient understands the surgical findings                                   and procedure, and she understands the need                                  for future cesarean section based on the                                  nature of the myomectomy.                               8. The patient will call if she experiences                                  fever, increased pain or bleeding, nausea and  vomiting, drainage or redness from her                                  incision or any other concern.   Dictated by: Devoria Albe Edward Jolly, M.D. Attending Physician:  Melony Overly DD:  03/06/01 TD:  03/07/01 Job: [id number]

## 2012-07-19 ENCOUNTER — Encounter (HOSPITAL_COMMUNITY): Payer: Self-pay | Admitting: Emergency Medicine

## 2012-07-19 ENCOUNTER — Emergency Department (HOSPITAL_COMMUNITY)
Admission: EM | Admit: 2012-07-19 | Discharge: 2012-07-19 | Disposition: A | Discharge status: Home / Self Care | Payer: BC Managed Care – PPO | Attending: Emergency Medicine | Admitting: Emergency Medicine

## 2012-07-19 DIAGNOSIS — L0390 Cellulitis, unspecified: Secondary | ICD-10-CM

## 2012-07-19 DIAGNOSIS — L02419 Cutaneous abscess of limb, unspecified: Secondary | ICD-10-CM | POA: Insufficient documentation

## 2012-07-19 DIAGNOSIS — L0291 Cutaneous abscess, unspecified: Secondary | ICD-10-CM

## 2012-07-19 MED ORDER — OXYCODONE-ACETAMINOPHEN 5-325 MG PO TABS
1.0000 | ORAL_TABLET | Freq: Once | ORAL | Status: AC
  Administered 2012-07-19: 1 via ORAL
  Filled 2012-07-19: qty 1

## 2012-07-19 MED ORDER — SULFAMETHOXAZOLE-TRIMETHOPRIM 800-160 MG PO TABS: 1.0000 | ORAL_TABLET | Freq: Two times a day (BID) | ORAL | Status: DC

## 2012-07-19 MED ORDER — LORAZEPAM 2 MG/ML IJ SOLN
1.0000 mg | Freq: Once | INTRAMUSCULAR | Status: AC
  Administered 2012-07-19: 1 mg via INTRAMUSCULAR
  Filled 2012-07-19: qty 1

## 2012-07-19 NOTE — ED Provider Notes (Signed)
Medical screening examination/treatment/procedure(s) were performed by non-physician practitioner and as supervising physician I was immediately available for consultation/collaboration.  Toy Baker, MD 07/19/12 [phone]

## 2012-07-19 NOTE — ED Notes (Signed)
Pt reports she first noted leg swelling Monday at work, thought it was an ingrown hair and did pick at it. Site now a pustule and tender to touch.

## 2012-07-19 NOTE — ED Provider Notes (Signed)
INCISION AND DRAINAGE Performed by: Junious Silk Consent: Verbal consent obtained. Risks and benefits: risks, benefits and alternatives were discussed Type: abscess  Body area: left lower leg  Anesthesia: local infiltration  Incision was made with a scalpel.  Local anesthetic: lidocaine 2%   Anesthetic total: 4 ml  Complexity: complex Blunt dissection to break up loculations  Drainage: purulent  Drainage amount: 3 mL  Packing material: none  Patient tolerance: Patient tolerated the procedure well with no immediate complications.     Mora Bellman, PA-C 07/19/12 1354

## 2012-08-03 NOTE — ED Provider Notes (Signed)
History     CSN: [id number]  Arrival date & time 07/19/12  1225   First MD Initiated Contact with Patient 07/19/12 1259      Chief Complaint  Patient presents with  . Abscess    (Consider location/radiation/quality/duration/timing/severity/associated sxs/prior treatment) HPI  Carrie Gordon is a 42 y.o. female complaining of left leg swelling worsening over the course of 6 days. Patient thought was an ingrown hair and she picked at it is now draining purulent discharge and is rated at moderately tender. Patient denies fever, nausea vomiting.  History reviewed. No pertinent past medical history.  History reviewed. No pertinent past surgical history.  No family history on file.  History  Substance Use Topics  . Smoking status: Never Smoker   . Smokeless tobacco: Never Used  . Alcohol Use: No    OB History   Grav Para Term Preterm Abortions TAB SAB Ect Mult Living                  Review of Systems  Constitutional: Negative for fever.  Respiratory: Negative for shortness of breath.   Cardiovascular: Negative for chest pain.  Gastrointestinal: Negative for nausea, vomiting, abdominal pain and diarrhea.  Skin: Positive for rash.  All other systems reviewed and are negative.    Allergies  Review of patient's allergies indicates no known allergies.  Home Medications   Current Outpatient Rx  Name  Route  Sig  Dispense  Refill  . ALPRAZolam (XANAX) 1 MG tablet   Oral   Take 1 mg by mouth at bedtime as needed for sleep.         . citalopram (CELEXA) 20 MG tablet   Oral   Take 20 mg by mouth daily.         . SUMAtriptan (IMITREX) 100 MG tablet   Oral   Take 100 mg by mouth every 2 (two) hours as needed for migraine.         . sulfamethoxazole-trimethoprim (SEPTRA DS) 800-160 MG per tablet   Oral   Take 1 tablet by mouth every 12 (twelve) hours.   14 tablet   0     BP 143/98  Pulse 76  Temp(Src) 98.1 F (36.7 C) (Oral)  Resp 22  SpO2  98%  Physical Exam  Nursing note and vitals reviewed. Constitutional: She is oriented to person, place, and time. She appears well-developed and well-nourished. No distress.  HENT:  Head: Normocephalic.  Eyes: Conjunctivae and EOM are normal.  Cardiovascular: Normal rate.   Pulmonary/Chest: Effort normal. No stridor.  Musculoskeletal: Normal range of motion.  Neurological: She is alert and oriented to person, place, and time.  Skin:  1.5 cm fluctuant abscess to left lower extremity lateral side. Mild surrounding cellulitis approximately half centimeter  Psychiatric: She has a normal mood and affect.    ED Course  Procedures (including critical care time)  Labs Reviewed - No data to display No results found.   1. Abscess and cellulitis       MDM   Carrie Gordon is a 42 y.o. female with abscess to left lower extremity. Incision and drainage performed by training PA Junious Silk.   Filed Vitals:   07/19/12 1252 07/19/12 1407  BP: 143/98   Pulse: 94 76  Temp: 97.7 F (36.5 C) 98.1 F (36.7 C)  TempSrc: Oral Oral  Resp:  22  SpO2: 98% 98%     Pt verbalized understanding and agrees with care plan. Outpatient follow-up and  return precautions given.    Discharge Medication List as of 07/19/2012  1:58 PM    START taking these medications   Details  sulfamethoxazole-trimethoprim (SEPTRA DS) 800-160 MG per tablet Take 1 tablet by mouth every 12 (twelve) hours., Starting 07/19/2012, Until Discontinued, [employer], PA-C 08/03/12 1400

## 2012-08-04 NOTE — ED Provider Notes (Signed)
Medical screening examination/treatment/procedure(s) were performed by non-physician practitioner and as supervising physician I was immediately available for consultation/collaboration.  Leone Putman T Damyon Mullane, MD 08/04/12 1544 

## 2012-10-09 ENCOUNTER — Other Ambulatory Visit: Payer: Self-pay | Admitting: Family Medicine

## 2012-10-14 NOTE — Telephone Encounter (Signed)
Refill times one 

## 2012-10-19 ENCOUNTER — Other Ambulatory Visit: Payer: Self-pay | Admitting: Family Medicine

## 2012-10-19 NOTE — Telephone Encounter (Signed)
Ok times one plus needs office visit before more.

## 2012-10-19 NOTE — Telephone Encounter (Signed)
Last office visit was 01/08/2012

## 2012-10-21 ENCOUNTER — Encounter: Payer: Self-pay | Admitting: *Deleted

## 2012-11-19 ENCOUNTER — Encounter: Payer: Self-pay | Admitting: Family Medicine

## 2012-11-19 ENCOUNTER — Ambulatory Visit (INDEPENDENT_AMBULATORY_CARE_PROVIDER_SITE_OTHER): Payer: BC Managed Care – PPO | Admitting: Family Medicine

## 2012-11-19 VITALS — BP 130/80 | Temp 97.9°F | Wt 177.1 lb

## 2012-11-19 DIAGNOSIS — J310 Chronic rhinitis: Secondary | ICD-10-CM

## 2012-11-19 DIAGNOSIS — J329 Chronic sinusitis, unspecified: Secondary | ICD-10-CM

## 2012-11-19 MED ORDER — CEFPROZIL 500 MG PO TABS: 500.0000 mg | ORAL_TABLET | Freq: Two times a day (BID) | ORAL | Status: DC

## 2012-11-19 NOTE — Progress Notes (Signed)
Subjective:    Patient ID: Carrie Gordon, female    DOB: May 14, 1970, 42 y.o.   MRN: [id number]  Sinusitis This is a new problem. The current episode started in the past 7 days. The problem has been gradually worsening since onset. There has been no fever. Her pain is at a severity of 3/10. Associated symptoms include congestion, coughing and a hoarse voice. Pertinent negatives include no headaches.   The patient has history of migraine headaches but experienced no migraines.   Review of Systems  HENT: Positive for congestion and hoarse voice.   Respiratory: Positive for cough.   Neurological: Negative for headaches.   ROS otherwise negative     Objective:   Physical Exam Alert mild malaise. Lungs clear. Heart regular rate and rhythm. HEENT normal. Except for frontal tenderness pharynx erythematous. Neck supple.       Assessment & Plan:  Impression sinusitis with secondary pharyngitis discussed. Plan Cefzil 500 twice a day 10 days. Of note on the way out the door patient stated she needed a refill on her anxiety medicine. Has not been seen for nearly one year for this. Given one month's worth and advised to followup. WSL

## 2013-01-25 ENCOUNTER — Other Ambulatory Visit: Payer: Self-pay | Admitting: Family Medicine

## 2013-01-29 ENCOUNTER — Ambulatory Visit (INDEPENDENT_AMBULATORY_CARE_PROVIDER_SITE_OTHER): Payer: BC Managed Care – PPO | Admitting: Family Medicine

## 2013-01-29 ENCOUNTER — Encounter: Payer: Self-pay | Admitting: Family Medicine

## 2013-01-29 VITALS — BP 132/86 | Temp 97.5°F | Ht 65.5 in | Wt 176.0 lb

## 2013-01-29 DIAGNOSIS — L0291 Cutaneous abscess, unspecified: Secondary | ICD-10-CM

## 2013-01-29 MED ORDER — DOXYCYCLINE HYCLATE 100 MG PO CAPS: 100.0000 mg | ORAL_CAPSULE | Freq: Two times a day (BID) | ORAL | Status: AC

## 2013-01-29 MED ORDER — MUPIROCIN 2 % EX OINT: TOPICAL_OINTMENT | CUTANEOUS | Status: DC

## 2013-01-29 MED ORDER — ALPRAZOLAM 1 MG PO TABS: ORAL_TABLET | ORAL | Status: DC

## 2013-01-29 NOTE — Patient Instructions (Addendum)
Doxycycline 100 one 2 times a day for 7 to 14 days

## 2013-01-29 NOTE — Progress Notes (Signed)
Subjective:    Patient ID: Carrie Gordon, female    DOB: 1971-03-10, 42 y.o.   MRN: [id number]  HPIHere for an ingrown hair on left forearm. Pt states she had an ingrown hair on her leg last may and she went to er and they cut it out. Pt states spot on her arm looks the same way. This area is painful and she used a heating pad on it.    Fevers. No vomiting diarrhea headache or muscle aches. Had problems similar to this [facility] earlier this year. Was placed on Bactrim had an abscess drained from the knee.  Review of Systems     Objective:   Physical Exam F on exam she does in fact have a small abscess in the left arm with folliculitis and also some moderate cellulitis with it. She the procedure was explained to her. It was numbed up with 1% lidocaine with epinephrine #11 blade was used pus was obtained. Culture was obtained and was sent.      Assessment & Plan:  Cellulitis most likely due to data set up on the computer desk in their room to come in and get him on the staph infection. We'll go with Doxil twice a day for the next 10 days warm compresses multiple times per day culture to be taken. Plus also Bactroban for the nasal area once each evening each nostril for the next 30 days, Clorox bath also recommended, warning signs discussed, if ongoing troubles or other problems let us know.  Anxiety/stress-continue Celexa may reduce it and a half a tablet a leave that up to the patient. Xanax refilled x2. Followup 6 months in regards to this issue. Suctioning

## 2013-02-01 LAB — WOUND CULTURE
Gram Stain: NONE SEEN
Gram Stain: NONE SEEN
Gram Stain: NONE SEEN

## 2013-05-26 ENCOUNTER — Other Ambulatory Visit: Payer: Self-pay | Admitting: Family Medicine

## 2013-07-03 ENCOUNTER — Other Ambulatory Visit: Payer: Self-pay | Admitting: Family Medicine

## 2013-07-05 ENCOUNTER — Ambulatory Visit: Payer: BC Managed Care – PPO | Admitting: Family Medicine

## 2013-07-05 ENCOUNTER — Telehealth: Payer: Self-pay

## 2013-07-05 MED ORDER — TRAMADOL HCL 50 MG PO TABS: 50.0000 mg | ORAL_TABLET | Freq: Four times a day (QID) | ORAL | Status: DC | PRN

## 2013-07-05 NOTE — Telephone Encounter (Signed)
Medication sent to pharmacy. Patient was notified.  

## 2013-07-05 NOTE — Telephone Encounter (Signed)
Tramadol 50 mg, #14 , 1 q6 hours prn migraine , not for frequent use- if ongoing Ha or worse then NTBS

## 2013-07-05 NOTE — Telephone Encounter (Signed)
Patient is currently having a bad migraine. Imitrex is not working. Can something else be called in? [facility].

## 2013-07-07 ENCOUNTER — Other Ambulatory Visit: Payer: Self-pay | Admitting: *Deleted

## 2013-07-07 MED ORDER — SUMATRIPTAN SUCCINATE 50 MG PO TABS: ORAL_TABLET | ORAL | Status: DC

## 2013-07-15 ENCOUNTER — Encounter: Payer: Self-pay | Admitting: Obstetrics and Gynecology

## 2013-07-15 ENCOUNTER — Ambulatory Visit (INDEPENDENT_AMBULATORY_CARE_PROVIDER_SITE_OTHER): Payer: BC Managed Care – PPO | Admitting: Obstetrics and Gynecology

## 2013-07-15 VITALS — BP 110/70 | HR 88 | Resp 16 | Ht 65.0 in | Wt 173.5 lb

## 2013-07-15 DIAGNOSIS — Z23 Encounter for immunization: Secondary | ICD-10-CM

## 2013-07-15 DIAGNOSIS — Z01419 Encounter for gynecological examination (general) (routine) without abnormal findings: Secondary | ICD-10-CM

## 2013-07-15 DIAGNOSIS — Z0000 Encounter for general adult medical examination without abnormal findings: Secondary | ICD-10-CM

## 2013-07-15 DIAGNOSIS — R319 Hematuria, unspecified: Secondary | ICD-10-CM

## 2013-07-15 LAB — COMPREHENSIVE METABOLIC PANEL
ALT: 20 U/L (ref 0–35)
AST: 23 U/L (ref 0–37)
Albumin: 4.1 g/dL (ref 3.5–5.2)
Alkaline Phosphatase: 100 U/L (ref 39–117)
BUN: 10 mg/dL (ref 6–23)
CO2: 25 mEq/L (ref 19–32)
Calcium: 8.6 mg/dL (ref 8.4–10.5)
Chloride: 103 mEq/L (ref 96–112)
Creat: 0.79 mg/dL (ref 0.50–1.10)
Glucose, Bld: 88 mg/dL (ref 70–99)
Potassium: 4.3 mEq/L (ref 3.5–5.3)
Sodium: 136 mEq/L (ref 135–145)
Total Bilirubin: 0.8 mg/dL (ref 0.2–1.2)
Total Protein: 7.3 g/dL (ref 6.0–8.3)

## 2013-07-15 LAB — CBC
HCT: 39.3 % (ref 36.0–46.0)
Hemoglobin: 13.9 g/dL (ref 12.0–15.0)
MCH: 31.5 pg (ref 26.0–34.0)
MCHC: 35.4 g/dL (ref 30.0–36.0)
MCV: 89.1 fL (ref 78.0–100.0)
Platelets: 237 10*3/uL (ref 150–400)
RBC: 4.41 MIL/uL (ref 3.87–5.11)
RDW: 14.2 % (ref 11.5–15.5)
WBC: 6.5 10*3/uL (ref 4.0–10.5)

## 2013-07-15 LAB — POCT URINALYSIS DIPSTICK
Bilirubin, UA: NEGATIVE
Glucose, UA: NEGATIVE
Ketones, UA: NEGATIVE
Leukocytes, UA: NEGATIVE
Nitrite, UA: NEGATIVE
Protein, UA: NEGATIVE
Urobilinogen, UA: NEGATIVE
pH, UA: 5

## 2013-07-15 NOTE — Patient Instructions (Signed)

## 2013-07-15 NOTE — Progress Notes (Signed)
Patient ID: Carrie Gordon, female   DOB: [birth year], 43 y.o.   MRN: [id number] GYNECOLOGY VISIT  PCP:   Lilyan Punt, MD  This is my former patient Carrie Gordon, who has newly married and changed her name.   HPI: 43 y.o.   Single  Caucasian  female   G1P1001 with No LMP recorded. Patient has had a hysterectomy.  Ovaries remain. here for  AEX. C/o of pelvic pressure. Some hot flashes - a little bit.  Occurs at night.  Sometime vaginal dryness.  3 grandchildren.  History of MRSA infection in September 2014.   History of bilateral nipple discharge (not spontaneous) and negative evaluation.  Attributed to antidepressant therapy.  Hgb:     Urine:  1+ RBC's.  Some pressure symptoms.   GYNECOLOGIC HISTORY: No LMP recorded. Patient has had a hysterectomy. Sexually active:  yes Partner preference: female Contraception:   Hysterectomy Menopausal hormone therapy: no DES exposure: no   Blood transfusions:   no Sexually transmitted diseases:  no  GYN procedures and prior surgeries:  Total abdominal hysterectomy, tubal ligation and myomectomy. Last mammogram:  05/2012 wnl:[facility]               Last pap and high risk HPV testing:  05/2012 UXL:[id number] of HPV testing  History of abnormal pap smear:  Remote past had abnormal pap.  No treatment.    OB History   Grav Para Term Preterm Abortions TAB SAB Ect Mult Living   1 1 1       1        LIFESTYLE: Exercise:    no          Tobacco:   no Alcohol:     Occ. wine Drug use:  no  OTHER HEALTH MAINTENANCE: Tetanus/TDap:  Unsure of last Gardisil:             n/a Influenza:           never Zostavax:            n/a  Bone density:      n/a Colonoscopy:      n/a  Cholesterol check:  06-26-13 wnl - [facility]  Family History  Problem Relation Age of Onset  . Adopted: Yes  . Family history unknown: Yes    There are no active problems to display for this patient.  Past Medical History  Diagnosis Date  . Migraines    . Back problem   . Uterine fibroid   . Bursitis of shoulder, right   . Anxiety   . Depression     Past Surgical History  Procedure Laterality Date  . Abdominal hysterectomy  2010    [facility]--Dr. Edward Jolly  . Myomectomy  2006    -Dr. Edward Jolly  . Tubal ligation  2002    -Dr. Edward Jolly    ALLERGIES: Review of patient's allergies indicates no known allergies.  Current Outpatient Prescriptions  Medication Sig Dispense Refill  . ALPRAZolam (XANAX) 1 MG tablet TAKE 1/2 TO 1 TABLET BY MOUTH TWICE DAILY AS NEEDED  40 tablet  2  . citalopram (CELEXA) 20 MG tablet TAKE ONE TABLET BY MOUTH DAILY  30 tablet  2  . SUMAtriptan (IMITREX) 50 MG tablet Take 1 tablet by mouth at start of migraine then 1 tablet by mouth 2 hours later if needed.  8 tablet  2  . traMADol (ULTRAM) 50 MG tablet Take 1 tablet (50 mg total) by mouth every 6 (six) hours as  needed.  14 tablet  0   No current facility-administered medications for this visit.     ROS:  Pertinent items are noted in HPI.  SOCIAL HISTORY:  Married one year ago.   PHYSICAL EXAMINATION:    BP 110/70  Pulse 88  Resp 16  Ht 5\' 5"  (1.651 m)  Wt 173 lb 8 oz (78.699 kg)  BMI 28.87 kg/m2   Wt Readings from Last 3 Encounters:  07/15/13 173 lb 8 oz (78.699 kg)  01/29/13 176 lb (79.833 kg)  11/19/12 177 lb 2 oz (80.343 kg)     Ht Readings from Last 3 Encounters:  07/15/13 5\' 5"  (1.651 m)  01/29/13 5' 5.5" (1.664 m)    General appearance: alert, cooperative and appears stated age Head: Normocephalic, without obvious abnormality, atraumatic Neck: no adenopathy, supple, symmetrical, trachea midline and thyroid not enlarged, symmetric, no tenderness/mass/nodules Lungs: clear to auscultation bilaterally Breasts: Inspection negative, No nipple retraction or dimpling, No nipple discharge or bleeding, No axillary or supraclavicular adenopathy, Normal to palpation without dominant masses Heart: regular rate and rhythm Abdomen: soft, non-tender; no masses,   no organomegaly Extremities: extremities normal, atraumatic, no cyanosis or edema Skin: Skin color, texture, turgor normal. No rashes or lesions Lymph nodes: Cervical, supraclavicular, and axillary nodes normal. No abnormal inguinal nodes palpated Neurologic: Grossly normal  Pelvic: External genitalia:  no lesions              Urethra:  normal appearing urethra with no masses, tenderness or lesions              Bartholins and Skenes: normal                 Vagina: normal appearing vagina with normal color and discharge, no lesions              Cervix: absent              Pap and high risk HPV testing done: no.            Bimanual Exam:  Uterus:  uterus is  absent                                      Adnexa: normal adnexa in size, nontender and no masses                                      Rectovaginal: Confirms                                      Anus:  normal sphincter tone, no lesions  ASSESSMENT  Normal gynecologic exam. Status post TAH.  Ovaries remain.  Perimenopausal female. Microscopic hematuria.   PLAN  Mammogram recommended yearly.  Pt will schedule at [facility].  Pap smear and high risk HPV testing not indicated. CBC, CMP. Urine micro and culture.  Counseled on self breast exam,  exercise. Return annually or prn   An After Visit Summary was printed and given to the patient.

## 2013-07-16 LAB — URINE CULTURE
Colony Count: NO GROWTH
Organism ID, Bacteria: NO GROWTH

## 2013-07-16 LAB — URINALYSIS, MICROSCOPIC ONLY
Bacteria, UA: NONE SEEN
Casts: NONE SEEN

## 2013-07-21 ENCOUNTER — Telehealth: Payer: Self-pay

## 2013-07-21 NOTE — Telephone Encounter (Signed)
Message copied by Lowella Fairy on Wed Jul 21, 2013  1:01 PM ------      Message from: [place]: Mon Jul 19, 2013  5:59 PM       Results of urine culture and micro to patient. ------

## 2013-07-21 NOTE — Telephone Encounter (Signed)
LMOVM to call for lab result.

## 2013-07-21 NOTE — Telephone Encounter (Signed)
Returning a call to Amanda.

## 2013-08-05 NOTE — Telephone Encounter (Signed)
May refill x4 but this medication can interact with her depression medicine and sometimes causes excessive sweating nausea vomiting if so she would need to stop those medicines. Make sure patient was informed of this.

## 2013-08-11 ENCOUNTER — Ambulatory Visit: Payer: BC Managed Care – PPO

## 2013-08-11 ENCOUNTER — Other Ambulatory Visit: Payer: Self-pay | Admitting: Obstetrics and Gynecology

## 2013-08-11 ENCOUNTER — Ambulatory Visit
Admission: RE | Admit: 2013-08-11 | Discharge: 2013-08-11 | Disposition: A | Discharge status: Home / Self Care | Payer: BC Managed Care – PPO | Source: Ambulatory Visit | Attending: Obstetrics and Gynecology | Admitting: Obstetrics and Gynecology

## 2013-08-11 DIAGNOSIS — Z1231 Encounter for screening mammogram for malignant neoplasm of breast: Secondary | ICD-10-CM

## 2013-08-12 ENCOUNTER — Other Ambulatory Visit: Payer: Self-pay | Admitting: Family Medicine

## 2013-08-12 NOTE — Telephone Encounter (Signed)
Ok plus three monthly ref 

## 2013-08-29 ENCOUNTER — Other Ambulatory Visit: Payer: Self-pay | Admitting: Family Medicine

## 2013-11-19 ENCOUNTER — Other Ambulatory Visit: Payer: Self-pay | Admitting: Family Medicine

## 2013-12-26 ENCOUNTER — Other Ambulatory Visit: Payer: Self-pay | Admitting: Family Medicine

## 2013-12-27 ENCOUNTER — Other Ambulatory Visit: Payer: Self-pay | Admitting: *Deleted

## 2014-03-14 ENCOUNTER — Encounter: Payer: Self-pay | Admitting: Obstetrics and Gynecology

## 2014-04-18 ENCOUNTER — Other Ambulatory Visit: Payer: Self-pay | Admitting: Family Medicine

## 2014-04-28 ENCOUNTER — Other Ambulatory Visit: Payer: Self-pay | Admitting: Family Medicine

## 2014-04-29 ENCOUNTER — Telehealth: Payer: Self-pay | Admitting: Family Medicine

## 2014-04-29 ENCOUNTER — Other Ambulatory Visit: Payer: Self-pay | Admitting: Family Medicine

## 2014-04-29 MED ORDER — ALPRAZOLAM 1 MG PO TABS: ORAL_TABLET | ORAL | Status: DC

## 2014-04-29 NOTE — Telephone Encounter (Signed)
1 refill, will need follow up ov before further

## 2014-04-29 NOTE — Telephone Encounter (Signed)
Notified patient script will be faxed to pharmacy today.

## 2014-04-29 NOTE — Telephone Encounter (Signed)
Patient had 2 refills of alprazolam, but they have expired.  She says that she is not going to be able to get in the office before the end of the year and wants to know if we can send in a refill?  [facility] on [address]

## 2014-06-13 ENCOUNTER — Other Ambulatory Visit: Payer: Self-pay | Admitting: Family Medicine

## 2014-07-22 ENCOUNTER — Ambulatory Visit: Payer: BC Managed Care – PPO | Admitting: Obstetrics and Gynecology

## 2014-07-27 ENCOUNTER — Encounter: Payer: Self-pay | Admitting: Obstetrics and Gynecology

## 2014-07-27 ENCOUNTER — Ambulatory Visit (INDEPENDENT_AMBULATORY_CARE_PROVIDER_SITE_OTHER): Payer: BLUE CROSS/BLUE SHIELD | Admitting: Obstetrics and Gynecology

## 2014-07-27 VITALS — BP 128/84 | HR 84 | Resp 16 | Ht 65.0 in | Wt 186.8 lb

## 2014-07-27 DIAGNOSIS — Z0000 Encounter for general adult medical examination without abnormal findings: Secondary | ICD-10-CM | POA: Diagnosis not present

## 2014-07-27 DIAGNOSIS — N393 Stress incontinence (female) (male): Secondary | ICD-10-CM

## 2014-07-27 DIAGNOSIS — R319 Hematuria, unspecified: Secondary | ICD-10-CM | POA: Diagnosis not present

## 2014-07-27 DIAGNOSIS — N951 Menopausal and female climacteric states: Secondary | ICD-10-CM | POA: Diagnosis not present

## 2014-07-27 DIAGNOSIS — Z01419 Encounter for gynecological examination (general) (routine) without abnormal findings: Secondary | ICD-10-CM | POA: Diagnosis not present

## 2014-07-27 LAB — POCT URINALYSIS DIPSTICK
Bilirubin, UA: NEGATIVE
Glucose, UA: NEGATIVE
Ketones, UA: NEGATIVE
Leukocytes, UA: NEGATIVE
Nitrite, UA: NEGATIVE
Protein, UA: NEGATIVE
Urobilinogen, UA: NEGATIVE
pH, UA: 5

## 2014-07-27 LAB — CBC
HCT: 40.6 % (ref 36.0–46.0)
Hemoglobin: 13.6 g/dL (ref 12.0–15.0)
MCH: 30.5 pg (ref 26.0–34.0)
MCHC: 33.5 g/dL (ref 30.0–36.0)
MCV: 91 fL (ref 78.0–100.0)
MPV: 10.7 fL (ref 8.6–12.4)
Platelets: 245 10*3/uL (ref 150–400)
RBC: 4.46 MIL/uL (ref 3.87–5.11)
RDW: 14 % (ref 11.5–15.5)
WBC: 8.2 10*3/uL (ref 4.0–10.5)

## 2014-07-27 LAB — LIPID PANEL
Cholesterol: 177 mg/dL (ref 0–200)
HDL: 56 mg/dL (ref >=46)
LDL Cholesterol: 110 mg/dL — ABNORMAL HIGH (ref 0–99)
Total CHOL/HDL Ratio: 3.2 Ratio
Triglycerides: 54 mg/dL (ref <150)
VLDL: 11 mg/dL (ref 0–40)

## 2014-07-27 LAB — BASIC METABOLIC PANEL
BUN: 8 mg/dL (ref 6–23)
CO2: 23 mEq/L (ref 19–32)
Calcium: 8.9 mg/dL (ref 8.4–10.5)
Chloride: 104 mEq/L (ref 96–112)
Creat: 0.8 mg/dL (ref 0.50–1.10)
Glucose, Bld: 93 mg/dL (ref 70–99)
Potassium: 3.8 mEq/L (ref 3.5–5.3)
Sodium: 139 mEq/L (ref 135–145)

## 2014-07-27 LAB — TSH: TSH: 0.917 u[IU]/mL (ref 0.350–4.500)

## 2014-07-27 LAB — HEMOGLOBIN, FINGERSTICK: Hemoglobin, fingerstick: 13.9 g/dL (ref 12.0–16.0)

## 2014-07-27 NOTE — Progress Notes (Signed)
Patient ID: Carrie Gordon, female   DOB: [birth year], 44 y.o.   MRN: [id number] 44 y.o. G1P1001 SingleAfrican AmericanF here for annual exam.    Having night sweats.  No hot flashes during the day.  Increased weight.  No real exercise.   Some urinary leakage if coughs or sneezes.  No pad use.   History of bilateral nipple discharge (not spontaneous) and negative evaluation. Attributed to antidepressant therapy. Has bilateral nipple discharge with stimulation.   PCP:  Lilyan Punt, MD  No LMP recorded. Patient has had a hysterectomy.          Sexually active: Yes.  female partner  The current method of family planning is status post hysterectomy.    Exercising: No.  none. Smoker:  yes  Health Maintenance: Pap:  05/2012 PXT:GGYIRS of HPV testing History of abnormal Pap:  Yes, remote past had abnormal pap.  No treatment MMG: 08-12-13 heterogeneously dense/nl:[facility] Colonoscopy:  n/a BMD:   n/a TDaP:  07-15-13 Screening Labs  Urine today: Mod RBC's - asymptomatic.    reports that she has never smoked. She has never used smokeless tobacco. She reports that she drinks alcohol. She reports that she does not use illicit drugs.  Past Medical History  Diagnosis Date  . Migraines   . Back problem   . Uterine fibroid   . Bursitis of shoulder, right   . Anxiety   . Depression     Past Surgical History  Procedure Laterality Date  . Abdominal hysterectomy  2010    [facility]--Dr. Edward Jolly  . Myomectomy  2006    -Dr. Edward Jolly  . Tubal ligation  2002    -Dr. Edward Jolly    Current Outpatient Prescriptions  Medication Sig Dispense Refill  . ALPRAZolam (XANAX) 1 MG tablet TAKE 1/2 TO 1 TABLET BY MOUTH TWICE DAILY AS NEEDED 40 tablet 0  . citalopram (CELEXA) 20 MG tablet TAKE 1 TABLET BY MOUTH DAILY 15 tablet 0  . omeprazole (PRILOSEC) 20 MG capsule Take 20 mg by mouth daily.    . SUMAtriptan (IMITREX) 50 MG tablet TAKE 1 TABLET BY MOUTH AT ONSET OF MIGRAINE. MAY REPEAT DOSE IN 2  HOURS IF NEEDED 8 tablet 0  . traMADol (ULTRAM) 50 MG tablet Take 1 tablet (50 mg total) by mouth every 6 (six) hours as needed. 14 tablet 0   No current facility-administered medications for this visit.    Family History  Problem Relation Age of Onset  . Adopted: Yes  . Family history unknown: Yes    ROS:  Pertinent items are noted in HPI.  Otherwise, a comprehensive ROS was negative.  Exam:   BP 128/84 mmHg  Pulse 84  Resp 16  Ht 5\' 5"  (1.651 m)  Wt 186 lb 12.8 oz (84.732 kg)  BMI 31.09 kg/m2     Height: 5\' 5"  (165.1 cm)  Ht Readings from Last 3 Encounters:  07/27/14 5\' 5"  (1.651 m)  07/15/13 5\' 5"  (1.651 m)  01/29/13 5' 5.5" (1.664 m)    General appearance: alert, cooperative and appears stated age Head: Normocephalic, without obvious abnormality, atraumatic Neck: no adenopathy, supple, symmetrical, trachea midline and thyroid normal to inspection and palpation Lungs: clear to auscultation bilaterally Breasts: normal appearance, no masses or tenderness, Inspection negative, No nipple retraction or dimpling, No nipple discharge or bleeding, No axillary or supraclavicular adenopathy Heart: regular rate and rhythm Abdomen: soft, non-tender; bowel sounds normal; no masses,  no organomegaly Extremities: extremities normal, atraumatic, no cyanosis or  edema Skin: Skin color, texture, turgor normal. No rashes or lesions Lymph nodes: Cervical, supraclavicular, and axillary nodes normal. No abnormal inguinal nodes palpated Neurologic: Grossly normal   Pelvic: External genitalia:  no lesions              Urethra:  normal appearing urethra with no masses, tenderness or lesions              Bartholins and Skenes: normal                 Vagina: normal appearing vagina with normal color and discharge, no lesions.  Good Kegel.              Cervix: absent              Pap taken: No. Bimanual Exam:  Uterus:  uterus absent              Adnexa: normal adnexa and no mass, fullness,  tenderness               Rectovaginal: Confirms               Anus:  normal sphincter tone, no lesions  Chaperone was present for exam.  A:  Well Woman with normal exam Menopausal symptoms. Status post TAH.  Mild stress incontinence.  Microscopic hematuria.   P:   Mammogram yearly.  Discussed 3D. Discussed ERT. Benefits and risks - DVT, PE, MI, stroke, breast CA.  Written info to patient.  She will consider. pap smear not indicated.  Kegel's recommended.  Discussed physical therapy and surgery are also options if needed.  Urine micro and culture.  Routine labs and TSH. return annually or prn

## 2014-07-27 NOTE — Patient Instructions (Signed)
Hormone Therapy At menopause, your body begins making less estrogen and progesterone hormones. This causes the body to stop having menstrual periods. This is because estrogen and progesterone hormones control your periods and menstrual cycle. A lack of estrogen may cause symptoms such as:  Hot flushes (or hot flashes).  Vaginal dryness.  Dry skin.  Loss of sex drive.  Risk of bone loss (osteoporosis). When this happens, you may choose to take hormone therapy to get back the estrogen lost during menopause. When the hormone estrogen is given alone, it is usually referred to as ET (Estrogen Therapy). When the hormone progestin is combined with estrogen, it is generally called HT (Hormone Therapy). This was formerly known as hormone replacement therapy (HRT). Your caregiver can help you make a decision on what will be best for you. The decision to use HT seems to change often as new studies are done. Many studies do not agree on the benefits of hormone replacement therapy. LIKELY BENEFITS OF HT INCLUDE PROTECTION FROM:  Hot Flushes (also called hot flashes) - A hot flush is a sudden feeling of heat that spreads over the face and body. The skin may redden like a blush. It is connected with sweats and sleep disturbance. Women going through menopause may have hot flushes a few times a month or several times per day depending on the woman.  Osteoporosis (bone loss)- Estrogen helps guard against bone loss. After menopause, a woman's bones slowly lose calcium and become weak and brittle. As a result, bones are more likely to break. The hip, wrist, and spine are affected most often. Hormone therapy can help slow bone loss after menopause. Weight bearing exercise and taking calcium with vitamin D also can help prevent bone loss. There are also medications that your caregiver can prescribe that can help prevent osteoporosis.  Vaginal Dryness - Loss of estrogen causes changes in the vagina. Its lining may  become thin and dry. These changes can cause pain and bleeding during sexual intercourse. Dryness can also lead to infections. This can cause burning and itching. (Vaginal estrogen treatment can help relieve pain, itching, and dryness.)  Urinary Tract Infections are more common after menopause because of lack of estrogen. Some women also develop urinary incontinence because of low estrogen levels in the vagina and bladder.  Possible other benefits of estrogen include a positive effect on mood and short-term memory in women. RISKS AND COMPLICATIONS  Using estrogen alone without progesterone causes the lining of the uterus to grow. This increases the risk of lining of the uterus (endometrial) cancer. Your caregiver should give another hormone called progestin if you have a uterus.  Women who take combined (estrogen and progestin) HT appear to have an increased risk of breast cancer. The risk appears to be small, but increases throughout the time that HT is taken.  Combined therapy also makes the breast tissue slightly denser which makes it harder to read mammograms (breast X-rays).  Combined, estrogen and progesterone therapy can be taken together every day, in which case there may be spotting of blood. HT therapy can be taken cyclically in which case you will have menstrual periods. Cyclically means HT is taken for a set amount of days, then not taken, then this process is repeated.  HT may increase the risk of stroke, heart attack, breast cancer and forming blood clots in your leg.  Transdermal estrogen (estrogen that is absorbed through the skin with a patch or a cream) may have more positive results with:    Cholesterol.  Blood pressure.  Blood clots. Having the following conditions may indicate you should not have HT:  Endometrial cancer.  Liver disease.  Breast cancer.  Heart disease.  History of blood clots.  Stroke. TREATMENT   If you choose to take HT and have a uterus,  usually estrogen and progestin are prescribed.  Your caregiver will help you decide the best way to take the medications.  Possible ways to take estrogen include:  Pills.  Patches.  Gels.  Sprays.  Vaginal estrogen cream, rings and tablets.  It is best to take the lowest dose possible that will help your symptoms and take them for the shortest period of time that you can.  Hormone therapy can help relieve some of the problems (symptoms) that affect women at menopause. Before making a decision about HT, talk to your caregiver about what is best for you. Be well informed and comfortable with your decisions. HOME CARE INSTRUCTIONS   Follow your caregivers advice when taking the medications.  A Pap test is done to screen for cervical cancer.  The first Pap test should be done at age 21.  Between ages 21 and 29, Pap tests are repeated every 2 years.  Beginning at age 30, you are advised to have a Pap test every 3 years as long as your past 3 Pap tests have been normal.  Some women have medical problems that increase the chance of getting cervical cancer. Talk to your caregiver about these problems. It is especially important to talk to your caregiver if a new problem develops soon after your last Pap test. In these cases, your caregiver may recommend more frequent screening and Pap tests.  The above recommendations are the same for women who have or have not gotten the vaccine for HPV (Human Papillomavirus).  If you had a hysterectomy for a problem that was not a cancer or a condition that could lead to cancer, then you no longer need Pap tests. However, even if you no longer need a Pap test, a regular exam is a good idea to make sure no other problems are starting.   If you are between ages 65 and 70, and you have had normal Pap tests going back 10 years, you no longer need Pap tests. However, even if you no longer need a Pap test, a regular exam is a good idea to make sure no  other problems are starting.   If you have had past treatment for cervical cancer or a condition that could lead to cancer, you need Pap tests and screening for cancer for at least 20 years after your treatment.  If Pap tests have been discontinued, risk factors (such as a new sexual partner) need to be re-assessed to determine if screening should be resumed.  Some women may need screenings more often if they are at high risk for cervical cancer.  Get mammograms done as per the advice of your caregiver. SEEK IMMEDIATE MEDICAL CARE IF:  You develop abnormal vaginal bleeding.  You have pain or swelling in your legs, shortness of breath, or chest pain.  You develop dizziness or headaches.  You have lumps or changes in your breasts or armpits.  You have slurred speech.  You develop weakness or numbness of your arms or legs.  You have pain, burning, or bleeding when urinating.  You develop abdominal pain. Document Released: 01/26/2003 Document Revised: 07/22/2011 Document Reviewed: 05/16/2010 [employer] Patient Information 2015 [employer]. This information is not intended to   replace advice given to you by your health care provider. Make sure you discuss any questions you have with your health care provider.  EXERCISE AND DIET:  We recommended that you start or continue a regular exercise program for good health. Regular exercise means any activity that makes your heart beat faster and makes you sweat.  We recommend exercising at least 30 minutes per day at least 3 days a week, preferably 4 or 5.  We also recommend a diet low in fat and sugar.  Inactivity, poor dietary choices and obesity can cause diabetes, heart attack, stroke, and kidney damage, among others.    ALCOHOL AND SMOKING:  Women should limit their alcohol intake to no more than 7 drinks/beers/glasses of wine (combined, not each!) per week. Moderation of alcohol intake to this level decreases your risk of breast cancer  and liver damage. And of course, no recreational drugs are part of a healthy lifestyle.  And absolutely no smoking or even second hand smoke. Most people know smoking can cause heart and lung diseases, but did you know it also contributes to weakening of your bones? Aging of your skin?  Yellowing of your teeth and nails?  CALCIUM AND VITAMIN D:  Adequate intake of calcium and Vitamin D are recommended.  The recommendations for exact amounts of these supplements seem to change often, but generally speaking 600 mg of calcium (either carbonate or citrate) and 800 units of Vitamin D per day seems prudent. Certain women may benefit from higher intake of Vitamin D.  If you are among these women, your doctor will have told you during your visit.    PAP SMEARS:  Pap smears, to check for cervical cancer or precancers,  have traditionally been done yearly, although recent scientific advances have shown that most women can have pap smears less often.  However, every woman still should have a physical exam from her gynecologist every year. It will include a breast check, inspection of the vulva and vagina to check for abnormal growths or skin changes, a visual exam of the cervix, and then an exam to evaluate the size and shape of the uterus and ovaries.  And after 46 years of age, a rectal exam is indicated to check for rectal cancers. We will also provide age appropriate advice regarding health maintenance, like when you should have certain vaccines, screening for sexually transmitted diseases, bone density testing, colonoscopy, mammograms, etc.   MAMMOGRAMS:  All women over 84 years old should have a yearly mammogram. Many facilities now offer a "3D" mammogram, which may cost around $50 extra out of pocket. If possible,  we recommend you accept the option to have the 3D mammogram performed.  It both reduces the number of women who will be called back for extra views which then turn out to be normal, and it is better  than the routine mammogram at detecting truly abnormal areas.    COLONOSCOPY:  Colonoscopy to screen for colon cancer is recommended for all women at age 87.  We know, you hate the idea of the prep.  We agree, BUT, having colon cancer and not knowing it is worse!!  Colon cancer so often starts as a polyp that can be seen and removed at colonscopy, which can quite literally save your life!  And if your first colonoscopy is normal and you have no family history of colon cancer, most women don't have to have it again for 10 years.  Once every ten years, you can  do something that may end up saving your life, right?  We will be happy to help you get it scheduled when you are ready.  Be sure to check your insurance coverage so you understand how much it will cost.  It may be covered as a preventative service at no cost, but you should check your particular policy.

## 2014-07-28 LAB — URINE CULTURE
Colony Count: NO GROWTH
Organism ID, Bacteria: NO GROWTH

## 2014-07-28 LAB — URINALYSIS, MICROSCOPIC ONLY
Bacteria, UA: NONE SEEN
Casts: NONE SEEN

## 2014-08-01 ENCOUNTER — Telehealth: Payer: Self-pay | Admitting: Family Medicine

## 2014-08-01 ENCOUNTER — Telehealth: Payer: Self-pay | Admitting: Obstetrics and Gynecology

## 2014-08-01 ENCOUNTER — Other Ambulatory Visit: Payer: Self-pay

## 2014-08-01 DIAGNOSIS — Z1239 Encounter for other screening for malignant neoplasm of breast: Secondary | ICD-10-CM

## 2014-08-01 MED ORDER — CITALOPRAM HYDROBROMIDE 20 MG PO TABS: 20.0000 mg | ORAL_TABLET | Freq: Every day | ORAL | Status: DC

## 2014-08-01 NOTE — Telephone Encounter (Signed)
Notified patient that refill of celexa has been sent to the pharmacy. Please keep appointment next month.

## 2014-08-01 NOTE — Telephone Encounter (Addendum)
Pt called stating that she is completely out of her antidepressants. Pt has an appt scheduled for 08/24/14.  [facility] [address] [employer]

## 2014-08-01 NOTE — Telephone Encounter (Signed)
Patient calling for recent results.

## 2014-08-01 NOTE — Telephone Encounter (Signed)
Spoke with patient. Results as seen below given. Patient is agreeable and verbalizes understanding.  Routing to provider for final review. Patient agreeable to disposition. Will close encounter

## 2014-08-01 NOTE — Telephone Encounter (Signed)
Entered by Nunzio Cobbs, MD at 07/31/2014 6:02 PM    Benton,   Your final urine testing shows no evidence of red blood cells on the microscopic view. It did show some calcium oxylate crystals, which can be associated with kidney stones. There was no sign of infection in the bladder.   Please monitor the pelvic pressure symptoms, and contact me if it persists. We will then likely do a pelvic ultrasound to evaluate your ovaries.   Have a happy spring!   Josefa Half, MD

## 2014-08-01 NOTE — Telephone Encounter (Signed)
Entered by Nunzio Cobbs, MD at 07/28/2014 6:40 AM    Good morning Carrie Gordon,   I have your blood tests back, and they look good! Your LDL cholesterol is slightly elevated, but the ratios of the cholesterol types are excellent! The blood chemistries, thyroid, and blood counts are normal.   Your urine test is still pending.    Great to see you!   Josefa Half, MD

## 2014-08-17 ENCOUNTER — Ambulatory Visit
Admission: RE | Admit: 2014-08-17 | Discharge: 2014-08-17 | Disposition: A | Discharge status: Home / Self Care | Payer: BLUE CROSS/BLUE SHIELD | Source: Ambulatory Visit

## 2014-08-17 DIAGNOSIS — Z1239 Encounter for other screening for malignant neoplasm of breast: Secondary | ICD-10-CM

## 2014-08-24 ENCOUNTER — Encounter: Payer: Self-pay | Admitting: Family Medicine

## 2014-08-24 ENCOUNTER — Ambulatory Visit (INDEPENDENT_AMBULATORY_CARE_PROVIDER_SITE_OTHER): Payer: BLUE CROSS/BLUE SHIELD | Admitting: Family Medicine

## 2014-08-24 VITALS — BP 138/86 | Ht 65.5 in | Wt 189.2 lb

## 2014-08-24 DIAGNOSIS — G43009 Migraine without aura, not intractable, without status migrainosus: Secondary | ICD-10-CM | POA: Diagnosis not present

## 2014-08-24 DIAGNOSIS — F32A Depression, unspecified: Secondary | ICD-10-CM

## 2014-08-24 DIAGNOSIS — F329 Major depressive disorder, single episode, unspecified: Secondary | ICD-10-CM

## 2014-08-24 DIAGNOSIS — J301 Allergic rhinitis due to pollen: Secondary | ICD-10-CM | POA: Diagnosis not present

## 2014-08-24 MED ORDER — CITALOPRAM HYDROBROMIDE 20 MG PO TABS: 20.0000 mg | ORAL_TABLET | Freq: Every day | ORAL | Status: DC

## 2014-08-24 MED ORDER — METHYLPREDNISOLONE ACETATE 40 MG/ML IJ SUSP
40.0000 mg | Freq: Once | INTRAMUSCULAR | Status: AC
Start: 2014-08-24 — End: 2014-08-24
  Administered 2014-08-24: 40 mg via INTRAMUSCULAR

## 2014-08-24 MED ORDER — ALPRAZOLAM 1 MG PO TABS: ORAL_TABLET | ORAL | Status: DC

## 2014-08-24 MED ORDER — SUMATRIPTAN SUCCINATE 50 MG PO TABS: ORAL_TABLET | ORAL | Status: DC

## 2014-08-24 NOTE — Progress Notes (Signed)
Subjective:    Patient ID: Carrie Gordon, female    DOB: 1971-02-13, 44 y.o.   MRN: [id number]  HPI Patient arrives for a follow up on depression meds and migraine meds. Patient also experiencing a flare of allergies. Patient denies any high fever chills sweats nausea vomiting diarrhea. She does relate a lot of head congestion sneezing coughing does not want to use nasal sprays is willing to use OTC allergy medicine Review of Systems  Constitutional: Negative for fever and activity change.  HENT: Positive for congestion and rhinorrhea. Negative for ear pain.   Eyes: Negative for discharge.  Respiratory: Positive for cough. Negative for shortness of breath and wheezing.   Cardiovascular: Negative for chest pain.   The benefits of risk of a single steroid shot was discussed patient states she consents to having steroid shot today    Objective:   Physical Exam  Constitutional: She appears well-developed and well-nourished. No distress.  HENT:  Head: Normocephalic.  Nose: Nose normal.  Mouth/Throat: Oropharynx is clear and moist. No oropharyngeal exudate.  Neck: Neck supple.  Cardiovascular: Normal rate, regular rhythm and normal heart sounds.   No murmur heard. Pulmonary/Chest: Effort normal and breath sounds normal. No respiratory distress. She has no wheezes.  Musculoskeletal: She exhibits no edema.  Lymphadenopathy:    She has no cervical adenopathy.  Neurological: She is alert. She exhibits normal muscle tone.  Skin: Skin is warm and dry.  Psychiatric: Her behavior is normal.  Nursing note and vitals reviewed.         Assessment & Plan:  Depression stable refills given Migraines intermittent only occasional refills given Allergies Depo-Medrol shot allergy medicine discussed follow-up if ongoing troubles

## 2014-09-08 ENCOUNTER — Telehealth: Payer: Self-pay | Admitting: Family Medicine

## 2014-09-08 NOTE — Telephone Encounter (Signed)
So typically with this type of symptoms and a combination of medications including generic Allegra 180 mg 1 daily this is available over-the-counter, Flonase generic prescription 2 sprays each nostril daily with 5 refills-and finally OTC allergy eyedrops but if she does not one to use OTC allergy eyedrops she may use [facility] prescription eyedrops with 5 refills one drop each eye daily

## 2014-09-08 NOTE — Telephone Encounter (Signed)
Left message to return call 

## 2014-09-08 NOTE — Telephone Encounter (Signed)
Discussed with patient. Patient verbalized understanding. Patient to try OTC meds and call back if no improvement.

## 2014-09-08 NOTE — Telephone Encounter (Signed)
Pt seen 4/13 given a shot, was better for a bit but the  Itchy watery eyes, sneezing, head congestion, clear/yellowish  Mucous   Can we call her in something for this? She has tried the OTC allergy meds but nothing has helped   [place] [address]

## 2014-09-20 ENCOUNTER — Encounter (HOSPITAL_COMMUNITY): Payer: Self-pay | Admitting: Emergency Medicine

## 2014-09-20 ENCOUNTER — Emergency Department (HOSPITAL_COMMUNITY)
Admission: EM | Admit: 2014-09-20 | Discharge: 2014-09-20 | Disposition: A | Discharge status: Home / Self Care | Payer: BLUE CROSS/BLUE SHIELD | Attending: Emergency Medicine | Admitting: Emergency Medicine

## 2014-09-20 DIAGNOSIS — M5441 Lumbago with sciatica, right side: Secondary | ICD-10-CM | POA: Diagnosis not present

## 2014-09-20 DIAGNOSIS — Z86018 Personal history of other benign neoplasm: Secondary | ICD-10-CM | POA: Insufficient documentation

## 2014-09-20 DIAGNOSIS — G43909 Migraine, unspecified, not intractable, without status migrainosus: Secondary | ICD-10-CM | POA: Insufficient documentation

## 2014-09-20 DIAGNOSIS — F329 Major depressive disorder, single episode, unspecified: Secondary | ICD-10-CM | POA: Diagnosis not present

## 2014-09-20 DIAGNOSIS — F419 Anxiety disorder, unspecified: Secondary | ICD-10-CM | POA: Insufficient documentation

## 2014-09-20 DIAGNOSIS — Z79899 Other long term (current) drug therapy: Secondary | ICD-10-CM | POA: Diagnosis not present

## 2014-09-20 DIAGNOSIS — M545 Low back pain: Secondary | ICD-10-CM | POA: Diagnosis present

## 2014-09-20 MED ORDER — DIAZEPAM 5 MG PO TABS
5.0000 mg | ORAL_TABLET | Freq: Once | ORAL | Status: AC
  Administered 2014-09-20: 5 mg via ORAL
  Filled 2014-09-20: qty 1

## 2014-09-20 MED ORDER — DIAZEPAM 5 MG PO TABS
5.0000 mg | ORAL_TABLET | Freq: Two times a day (BID) | ORAL | Status: DC | PRN
Start: 2014-09-20 — End: 2015-08-21

## 2014-09-20 MED ORDER — HYDROCODONE-ACETAMINOPHEN 5-325 MG PO TABS: 1.0000 | ORAL_TABLET | ORAL | Status: DC | PRN

## 2014-09-20 MED ORDER — OXYCODONE-ACETAMINOPHEN 5-325 MG PO TABS
1.0000 | ORAL_TABLET | Freq: Once | ORAL | Status: AC
Start: 2014-09-20 — End: 2014-09-20
  Administered 2014-09-20: 1 via ORAL
  Filled 2014-09-20: qty 1

## 2014-09-20 MED ORDER — NAPROXEN 500 MG PO TABS
500.0000 mg | ORAL_TABLET | Freq: Once | ORAL | Status: AC
  Administered 2014-09-20: 500 mg via ORAL
  Filled 2014-09-20: qty 1

## 2014-09-20 MED ORDER — NAPROXEN 500 MG PO TABS: 500.0000 mg | ORAL_TABLET | Freq: Two times a day (BID) | ORAL | Status: DC

## 2014-09-20 NOTE — ED Provider Notes (Signed)
CSN: [id number]     Arrival date & time 09/20/14  1258 History  This chart was scribed for non-physician practitioner, Lucien Mons, PA-C,working with Charlesetta Shanks, MD, by Marlowe Kays, ED Scribe. This patient was seen in room WTR8/WTR8 and the patient's care was started at 1:50 PM.  Chief Complaint  Patient presents with  . Back Pain   HPI  HPI Comments:  Carrie Gordon is a 44 y.o. female with PMHx of back problems, who presents to the Emergency Department complaining of moderate right-sided lower back pain that began approximately two weeks ago. Pt states this morning the pain became severe and radiates down into her right buttocks and right legs. She states she has taken Imitrex for pain because that is all she had with her that provided some relief of the pain. Movement makes the pain worse. Denies alleviating factors. Denies abdominal pain, nausea, vomiting, fever, chills, bowel or bladder incontinence, numbness, tingling or weakness of the lower extremities or saddle anesthesia. PMHx migraines, uterine fibroids, depression and anxiety. Denies trauma, injury or fall. PCP is Dr. Wolfgang Phoenix.  Past Medical History  Diagnosis Date  . Migraines   . Back problem   . Uterine fibroid   . Bursitis of shoulder, right   . Anxiety   . Depression    Past Surgical History  Procedure Laterality Date  . Abdominal hysterectomy  2010    [facility]--Dr. Quincy Simmonds  . Myomectomy  2006    -Dr. Quincy Simmonds  . Tubal ligation  2002    -Dr. Quincy Simmonds   Family History  Problem Relation Age of Onset  . Adopted: Yes  . Family history unknown: Yes   History  Substance Use Topics  . Smoking status: Never Smoker   . Smokeless tobacco: Never Used  . Alcohol Use: 0.0 oz/week    0 Standard drinks or equivalent per week     Comment: occ wine   OB History    Gravida Para Term Preterm AB TAB SAB Ectopic Multiple Living   1 1 1       1      Review of Systems  Constitutional: Negative for fever and chills.   Gastrointestinal: Negative for nausea, vomiting and abdominal pain.  Genitourinary:       No bowel or bladder incontinence. No saddle anesthesia.  Musculoskeletal: Positive for back pain.  Skin: Negative for color change and wound.  Neurological: Negative for weakness and numbness.   Allergies  Review of patient's allergies indicates no known allergies.  Home Medications   Prior to Admission medications   Medication Sig Start Date End Date Taking? Authorizing Provider  ALPRAZolam Duanne Moron) 1 MG tablet TAKE 1/2 TO 1 TABLET BY MOUTH TWICE DAILY AS NEEDED 08/24/14   Kathyrn Drown, MD  citalopram (CELEXA) 20 MG tablet Take 1 tablet (20 mg total) by mouth daily. 08/24/14   Kathyrn Drown, MD  diazepam (VALIUM) 5 MG tablet Take 1 tablet (5 mg total) by mouth every 12 (twelve) hours as needed for muscle spasms. 09/20/14   Brigett Estell M Arris Meyn, PA-C  esomeprazole (NEXIUM) 20 MG capsule Take 20 mg by mouth daily at 12 noon.    Historical Provider, MD  HYDROcodone-acetaminophen (NORCO/VICODIN) 5-325 MG per tablet Take 1-2 tablets by mouth every 4 (four) hours as needed. 09/20/14   Tonie Elsey M Maite Burlison, PA-C  naproxen (NAPROSYN) 500 MG tablet Take 1 tablet (500 mg total) by mouth 2 (two) times daily. 09/20/14   Carman Ching, PA-C  SUMAtriptan (IMITREX) 50  MG tablet TAKE 1 TABLET BY MOUTH AT ONSET OF MIGRAINE. MAY REPEAT DOSE IN 2 HOURS IF NEEDED 08/24/14   Kathyrn Drown, MD   Triage Vitals: BP 145/90 mmHg  Pulse 79  Temp(Src) 97.6 F (36.4 C) (Oral)  Resp 18  SpO2 98% Physical Exam  Constitutional: She is oriented to person, place, and time. She appears well-developed and well-nourished. No distress.  HENT:  Head: Normocephalic and atraumatic.  Mouth/Throat: Oropharynx is clear and moist.  Eyes: Conjunctivae are normal.  Neck: Normal range of motion. Neck supple. No spinous process tenderness and no muscular tenderness present.  Cardiovascular: Normal rate, regular rhythm and normal heart sounds.    Pulmonary/Chest: Effort normal and breath sounds normal. No respiratory distress.  Musculoskeletal: She exhibits no edema.  TTP over right SI joint and throughout right buttock. Positive straight leg raise on right, positive cross straight leg raise. No spinous process tenderness.  Neurological: She is alert and oriented to person, place, and time. She has normal strength.  Strength lower extremities 5/5 and equal bilateral. Sensation intact. Normal gait.  Skin: Skin is warm and dry. No rash noted. She is not diaphoretic.  Psychiatric: She has a normal mood and affect. Her behavior is normal.  Nursing note and vitals reviewed.   ED Course  Procedures (including critical care time) DIAGNOSTIC STUDIES: Oxygen Saturation is 98% on RA, normal by my interpretation.   COORDINATION OF CARE: 1:56 PM- Will prescribe NSAID, muscle relaxer and pain medicaiton and refer to PCP, may need to see ortho. Pt verbalizes understanding and agrees to plan.  Medications  oxyCODONE-acetaminophen (PERCOCET/ROXICET) 5-325 MG per tablet 1 tablet (1 tablet Oral Given 09/20/14 1406)  diazepam (VALIUM) tablet 5 mg (5 mg Oral Given 09/20/14 1406)  naproxen (NAPROSYN) tablet 500 mg (500 mg Oral Given 09/20/14 1406)    Labs Review Labs Reviewed - No data to display  Imaging Review No results found.   EKG Interpretation None      MDM   Final diagnoses:  Right-sided low back pain with right-sided sciatica   NAD. AFVSS. No red flags concerning patient's back pain. No s/s of central cord compression or cauda equina. Lower extremities are neurovascularly intact and patient is ambulating without difficulty. Symptoms and exam consistent with sciatica. Stable for discharge. Return precautions given. Patient states understanding of treatment care plan and is agreeable.  I personally performed the services described in this documentation, which was scribed in my presence. The recorded information has been reviewed  and is accurate.  Carman Ching, PA-C 09/20/14 1410  Charlesetta Shanks, MD 09/21/14 [phone]

## 2014-09-20 NOTE — Discharge Instructions (Signed)
Take naproxen as prescribed. Take Vicodin for severe pain only. No driving or operating heavy machinery while taking vicodin. This medication may cause drowsiness. Take Valium as needed as directed for muscle spasm. No driving or operating heavy machinery while taking valium. This medication may cause drowsiness. Rest, apply ice intermittently for the next 24 hours followed by heat. Avoid heavy lifting or hard physical activity.  Sciatica Sciatica is pain, weakness, numbness, or tingling along the path of the sciatic nerve. The nerve starts in the lower back and runs down the back of each leg. The nerve controls the muscles in the lower leg and in the back of the knee, while also providing sensation to the back of the thigh, lower leg, and the sole of your foot. Sciatica is a symptom of another medical condition. For instance, nerve damage or certain conditions, such as a herniated disk or bone spur on the spine, pinch or put pressure on the sciatic nerve. This causes the pain, weakness, or other sensations normally associated with sciatica. Generally, sciatica only affects one side of the body. CAUSES   Herniated or slipped disc.  Degenerative disk disease.  A pain disorder involving the narrow muscle in the buttocks (piriformis syndrome).  Pelvic injury or fracture.  Pregnancy.  Tumor (rare). SYMPTOMS  Symptoms can vary from mild to very severe. The symptoms usually travel from the low back to the buttocks and down the back of the leg. Symptoms can include:  Mild tingling or dull aches in the lower back, leg, or hip.  Numbness in the back of the calf or sole of the foot.  Burning sensations in the lower back, leg, or hip.  Sharp pains in the lower back, leg, or hip.  Leg weakness.  Severe back pain inhibiting movement. These symptoms may get worse with coughing, sneezing, laughing, or prolonged sitting or standing. Also, being overweight may worsen symptoms. DIAGNOSIS  Your  caregiver will perform a physical exam to look for common symptoms of sciatica. He or she may ask you to do certain movements or activities that would trigger sciatic nerve pain. Other tests may be performed to find the cause of the sciatica. These may include:  Blood tests.  X-rays.  Imaging tests, such as an MRI or CT scan. TREATMENT  Treatment is directed at the cause of the sciatic pain. Sometimes, treatment is not necessary and the pain and discomfort goes away on its own. If treatment is needed, your caregiver may suggest:  Over-the-counter medicines to relieve pain.  Prescription medicines, such as anti-inflammatory medicine, muscle relaxants, or narcotics.  Applying heat or ice to the painful area.  Steroid injections to lessen pain, irritation, and inflammation around the nerve.  Reducing activity during periods of pain.  Exercising and stretching to strengthen your abdomen and improve flexibility of your spine. Your caregiver may suggest losing weight if the extra weight makes the back pain worse.  Physical therapy.  Surgery to eliminate what is pressing or pinching the nerve, such as a bone spur or part of a herniated disk. HOME CARE INSTRUCTIONS   Only take over-the-counter or prescription medicines for pain or discomfort as directed by your caregiver.  Apply ice to the affected area for 20 minutes, 3-4 times a day for the first 48-72 hours. Then try heat in the same way.  Exercise, stretch, or perform your usual activities if these do not aggravate your pain.  Attend physical therapy sessions as directed by your caregiver.  Keep all follow-up  appointments as directed by your caregiver.  Do not wear high heels or shoes that do not provide proper support.  Check your mattress to see if it is too soft. A firm mattress may lessen your pain and discomfort. SEEK IMMEDIATE MEDICAL CARE IF:   You lose control of your bowel or bladder (incontinence).  You have  increasing weakness in the lower back, pelvis, buttocks, or legs.  You have redness or swelling of your back.  You have a burning sensation when you urinate.  You have pain that gets worse when you lie down or awakens you at night.  Your pain is worse than you have experienced in the past.  Your pain is lasting longer than 4 weeks.  You are suddenly losing weight without reason. MAKE SURE YOU:  Understand these instructions.  Will watch your condition.  Will get help right away if you are not doing well or get worse. Document Released: 04/23/2001 Document Revised: 10/29/2011 Document Reviewed: 09/08/2011 [facility] Patient Information 2015 [place], [place]. This information is not intended to replace advice given to you by your health care provider. Make sure you discuss any questions you have with your health care provider. Back Pain, Adult Low back pain is very common. About 1 in 5 people have back pain.The cause of low back pain is rarely dangerous. The pain often gets better over time.About half of people with a sudden onset of back pain feel better in just 2 weeks. About 8 in 10 people feel better by 6 weeks.  CAUSES Some common causes of back pain include:  Strain of the muscles or ligaments supporting the spine.  Wear and tear (degeneration) of the spinal discs.  Arthritis.  Direct injury to the back. DIAGNOSIS Most of the time, the direct cause of low back pain is not known.However, back pain can be treated effectively even when the exact cause of the pain is unknown.Answering your caregiver's questions about your overall health and symptoms is one of the most accurate ways to make sure the cause of your pain is not dangerous. If your caregiver needs more information, he or she may order lab work or imaging tests (X-rays or MRIs).However, even if imaging tests show changes in your back, this usually does not require surgery. HOME CARE INSTRUCTIONS For many people, back  pain returns.Since low back pain is rarely dangerous, it is often a condition that people can learn to [facility] their own.   Remain active. It is stressful on the back to sit or stand in one place. Do not sit, drive, or stand in one place for more than 30 minutes at a time. Take short walks on level surfaces as soon as pain allows.Try to increase the length of time you walk each day.  Do not stay in bed.Resting more than 1 or 2 days can delay your recovery.  Do not avoid exercise or work.Your body is made to move.It is not dangerous to be active, even though your back may hurt.Your back will likely heal faster if you return to being active before your pain is gone.  Pay attention to your body when you bend and lift. Many people have less discomfortwhen lifting if they bend their knees, keep the load close to their bodies,and avoid twisting. Often, the most comfortable positions are those that put less stress on your recovering back.  Find a comfortable position to sleep. Use a firm mattress and lie on your side with your knees slightly bent. If you lie  on your back, put a pillow under your knees.  Only take over-the-counter or prescription medicines as directed by your caregiver. Over-the-counter medicines to reduce pain and inflammation are often the most helpful.Your caregiver may prescribe muscle relaxant drugs.These medicines help dull your pain so you can more quickly return to your normal activities and healthy exercise.  Put ice on the injured area.  Put ice in a plastic bag.  Place a towel between your skin and the bag.  Leave the ice on for 15-20 minutes, 03-04 times a day for the first 2 to 3 days. After that, ice and heat may be alternated to reduce pain and spasms.  Ask your caregiver about trying back exercises and gentle massage. This may be of some benefit.  Avoid feeling anxious or stressed.Stress increases muscle tension and can worsen back pain.It is important  to recognize when you are anxious or stressed and learn ways to manage it.Exercise is a great option. SEEK MEDICAL CARE IF:  You have pain that is not relieved with rest or medicine.  You have pain that does not improve in 1 week.  You have new symptoms.  You are generally not feeling well. SEEK IMMEDIATE MEDICAL CARE IF:   You have pain that radiates from your back into your legs.  You develop new bowel or bladder control problems.  You have unusual weakness or numbness in your arms or legs.  You develop nausea or vomiting.  You develop abdominal pain.  You feel faint. Document Released: 04/29/2005 Document Revised: 10/29/2011 Document Reviewed: 08/31/2013 [facility] Patient Information 2015 [place], [place]. This information is not intended to replace advice given to you by your health care provider. Make sure you discuss any questions you have with your health care provider.

## 2014-09-20 NOTE — ED Notes (Signed)
Pt complaint of back pain x 2 weeks, increasing, shooting down right leg, no history of back problems. She thought it may have come from picking up her grandson, possibly their new chair. States she woke up this morning and the pain has become almost unbearable. Denies urinary/bowel problems.

## 2014-09-28 ENCOUNTER — Ambulatory Visit (INDEPENDENT_AMBULATORY_CARE_PROVIDER_SITE_OTHER): Payer: BLUE CROSS/BLUE SHIELD | Admitting: Family Medicine

## 2014-09-28 ENCOUNTER — Encounter: Payer: Self-pay | Admitting: Family Medicine

## 2014-09-28 VITALS — BP 126/88 | Ht 65.5 in | Wt 188.1 lb

## 2014-09-28 DIAGNOSIS — M5431 Sciatica, right side: Secondary | ICD-10-CM

## 2014-09-28 MED ORDER — GABAPENTIN 100 MG PO CAPS: 100.0000 mg | ORAL_CAPSULE | Freq: Three times a day (TID) | ORAL | Status: DC

## 2014-09-28 MED ORDER — OXYCODONE-ACETAMINOPHEN 5-325 MG PO TABS: 1.0000 | ORAL_TABLET | ORAL | Status: DC | PRN

## 2014-09-28 MED ORDER — PREDNISONE 20 MG PO TABS: ORAL_TABLET | ORAL | Status: DC

## 2014-09-28 NOTE — Progress Notes (Signed)
Subjective:    Patient ID: Carrie Gordon, female    DOB: 16-May-1970, 44 y.o.   MRN: [id number]  Back Pain This is a new problem. The current episode started 1 to 4 weeks ago. The problem occurs daily. The problem is unchanged. The pain is present in the gluteal, lumbar spine and sacro-iliac. The quality of the pain is described as shooting and burning. The pain radiates to the right thigh. The pain is at a severity of 10/10. The pain is severe. The pain is the same all the time. The symptoms are aggravated by position, bending, standing and twisting. Associated symptoms include leg pain and numbness. She has tried analgesics for the symptoms. The treatment provided no relief.   Patient is here today for a ER follow up visit. Patient was seen at [facility] ER on Tuesday for low back pain. Patient states that her symptoms have not improved. Pain is still noted. Pain scale 8/10.  This is now been going on for at least 3-1/2 weeks.  Review of Systems  Musculoskeletal: Positive for back pain.  Neurological: Positive for numbness.       Objective:   Physical Exam  Lungs clear heart regular low back moderate tenderness positive straight leg raise on the right muscle strength is good right and left. Reflexes good, walk on toes and heels.      Assessment & Plan:  Severe sciatica-anti-inflammatories pain medication muscle relaxers when necessary cautioned drowsiness if ongoing troubles pass the next several weeks consider MRI recheck patient in 4-6 weeks. Gabapentin discuss warnings regarding surgical issues discussed area

## 2014-10-21 ENCOUNTER — Ambulatory Visit: Payer: BLUE CROSS/BLUE SHIELD | Admitting: Family Medicine

## 2014-12-05 ENCOUNTER — Telehealth: Payer: Self-pay | Admitting: Family Medicine

## 2014-12-05 NOTE — Telephone Encounter (Signed)
She may have an appointment with Arbie Cookey and I am now until next week and tomorrow I am totally booked

## 2014-12-05 NOTE — Telephone Encounter (Signed)
Pt called stating that she wants to be seen for her migraine headaches. Pt states that her current meds are not working and she doesn't want to keep taking them and it not go anywhere. Pt wants to be seen Wednesday when she is off. Please advise.

## 2014-12-06 ENCOUNTER — Ambulatory Visit (INDEPENDENT_AMBULATORY_CARE_PROVIDER_SITE_OTHER): Payer: BLUE CROSS/BLUE SHIELD | Admitting: Nurse Practitioner

## 2014-12-06 ENCOUNTER — Encounter: Payer: Self-pay | Admitting: Nurse Practitioner

## 2014-12-06 ENCOUNTER — Other Ambulatory Visit: Payer: Self-pay | Admitting: *Deleted

## 2014-12-06 VITALS — BP 130/82 | Ht 65.5 in | Wt 186.0 lb

## 2014-12-06 DIAGNOSIS — M62838 Other muscle spasm: Secondary | ICD-10-CM

## 2014-12-06 DIAGNOSIS — M6248 Contracture of muscle, other site: Secondary | ICD-10-CM | POA: Diagnosis not present

## 2014-12-06 DIAGNOSIS — G43909 Migraine, unspecified, not intractable, without status migrainosus: Secondary | ICD-10-CM | POA: Insufficient documentation

## 2014-12-06 DIAGNOSIS — G43011 Migraine without aura, intractable, with status migrainosus: Secondary | ICD-10-CM | POA: Diagnosis not present

## 2014-12-06 MED ORDER — METHYLPREDNISOLONE ACETATE 40 MG/ML IJ SUSP
40.0000 mg | Freq: Once | INTRAMUSCULAR | Status: AC
  Administered 2014-12-06: 40 mg via INTRAMUSCULAR

## 2014-12-06 MED ORDER — NAPROXEN 500 MG PO TABS: 500.0000 mg | ORAL_TABLET | Freq: Two times a day (BID) | ORAL | Status: DC

## 2014-12-06 MED ORDER — METHOCARBAMOL 500 MG PO TABS: 500.0000 mg | ORAL_TABLET | Freq: Three times a day (TID) | ORAL | Status: DC | PRN

## 2014-12-06 NOTE — Patient Instructions (Signed)
Ice/heat application Massage (kneaded energy) Chiropractor OTC icy hot smart relief TENS unit

## 2014-12-06 NOTE — Progress Notes (Signed)
Subjective:  Presents for complaints of a flareup of her migraine headaches that began 2 days ago. Minimal relief with Imitrex. Nausea initially, this has resolved. No vomiting. Describes throbbing pain in the temporal area and top of the head. 8/10 on a pain scale today. Photosensitivity. Phonophobia. No visual changes. Slight dizziness. No numbness or weakness of the face arms or legs. Has not identified any specific trigger but thinks it may be related to stress. No change in migraine symptomatology. This is the first migraine she has had a long time.  Objective:   BP 130/82 mmHg  Ht 5' 5.5" (1.664 m)  Wt 186 lb (84.369 kg)  BMI 30.47 kg/m2 NAD. Alert, oriented. TMs normal limit. Pharynx clear. Neck supple with minimal adenopathy. Lungs clear. Heart regular rate rhythm. Extremely tight tender muscles noted along the upper back shoulder and right cervical area. Less tightness noted on the left side.  Assessment:  Problem List Items Addressed This Visit      Cardiovascular and Mediastinum   Migraines - Primary   Relevant Medications   methocarbamol (ROBAXIN) 500 MG tablet   naproxen (NAPROSYN) 500 MG tablet   methylPREDNISolone acetate (DEPO-MEDROL) injection 40 mg (Completed)    Other Visit Diagnoses    Muscle spasms of head and/or neck        Relevant Medications    methylPREDNISolone acetate (DEPO-MEDROL) injection 40 mg (Completed)      Plan:  Meds ordered this encounter  Medications  . methocarbamol (ROBAXIN) 500 MG tablet    Sig: Take 1 tablet (500 mg total) by mouth every 8 (eight) hours as needed for muscle spasms.    Dispense:  30 tablet    Refill:  0    Order Specific Question:  Supervising Provider    Answer:  Mikey Kirschner [[id number]]  . naproxen (NAPROSYN) 500 MG tablet    Sig: Take 1 tablet (500 mg total) by mouth 2 (two) times daily.    Dispense:  30 tablet    Refill:  0    Order Specific Question:  Supervising Provider    Answer:  Mikey Kirschner [[id number]]  .  methylPREDNISolone acetate (DEPO-MEDROL) injection 40 mg    Sig:    Patient has a few narcotic pain relievers at home. Use sparingly. Discussed importance of stress reduction and relief from muscle spasms. Ice/heat application Massage (kneaded energy) Chiropractor OTC icy hot smart relief TENS unit  Warning signs reviewed. Call back in 72 hours if no improvement, sooner if worse.

## 2015-01-02 ENCOUNTER — Telehealth: Payer: Self-pay | Admitting: Family Medicine

## 2015-01-02 MED ORDER — OXYCODONE-ACETAMINOPHEN 5-325 MG PO TABS: 1.0000 | ORAL_TABLET | ORAL | Status: DC | PRN

## 2015-01-02 NOTE — Telephone Encounter (Signed)
Advised the patient do not take muscle relaxers with pain medicine

## 2015-01-02 NOTE — Telephone Encounter (Signed)
Pt requesting refill on her oxyCODONE-acetaminophen (PERCOCET/ROXICET) 5-325 MG per tablet, states she's going out of town tomorrow and would like to pick it up today

## 2015-01-02 NOTE — Telephone Encounter (Signed)
Spoke with patient and informed her per Dr.Scott-that she could have a one month refill of the Percocet but she will need to follow up office visit for pain medication visit before next refill. Also informed the patient not to take with muscle relaxer. Patient verbalized understanding and stated that she is needing the percocet to help control migraines due to the imtrex not working.

## 2015-01-02 NOTE — Telephone Encounter (Signed)
She may have a refill of this but let the patient know that she will need to follow-up office visit for pain medication visit before next refill

## 2015-01-03 ENCOUNTER — Encounter: Payer: Self-pay | Admitting: Family Medicine

## 2015-01-03 NOTE — Telephone Encounter (Signed)
A letter was sent to the patient encouraging her to follow-up to discuss her headaches and possible daily preventative medicines. The goal is to minimize use of narcotics for her headaches.

## 2015-03-31 ENCOUNTER — Telehealth: Payer: Self-pay | Admitting: Family Medicine

## 2015-03-31 MED ORDER — ALPRAZOLAM 1 MG PO TABS: ORAL_TABLET | ORAL | Status: DC

## 2015-03-31 NOTE — Telephone Encounter (Signed)
Called patient and informed her per Dr.Scott Luking- Refill on Xanax will be sent to requested pharmacy. Patient verbalized understanding

## 2015-03-31 NOTE — Telephone Encounter (Signed)
Xanax refill ok times one

## 2015-03-31 NOTE — Telephone Encounter (Signed)
Pt is needing a refill sent in for her zanax.    [facility] [address]

## 2015-05-14 DIAGNOSIS — R7309 Other abnormal glucose: Secondary | ICD-10-CM

## 2015-05-14 HISTORY — DX: Other abnormal glucose: R73.09

## 2015-08-16 ENCOUNTER — Ambulatory Visit: Payer: BLUE CROSS/BLUE SHIELD | Admitting: Obstetrics and Gynecology

## 2015-08-21 ENCOUNTER — Ambulatory Visit (INDEPENDENT_AMBULATORY_CARE_PROVIDER_SITE_OTHER): Payer: BLUE CROSS/BLUE SHIELD | Admitting: Obstetrics and Gynecology

## 2015-08-21 ENCOUNTER — Other Ambulatory Visit: Payer: Self-pay | Admitting: Obstetrics and Gynecology

## 2015-08-21 ENCOUNTER — Encounter: Payer: Self-pay | Admitting: Obstetrics and Gynecology

## 2015-08-21 VITALS — BP 122/84 | HR 80 | Resp 18 | Ht 65.0 in | Wt 188.4 lb

## 2015-08-21 DIAGNOSIS — Z01419 Encounter for gynecological examination (general) (routine) without abnormal findings: Secondary | ICD-10-CM

## 2015-08-21 DIAGNOSIS — Z0000 Encounter for general adult medical examination without abnormal findings: Secondary | ICD-10-CM

## 2015-08-21 LAB — LIPID PANEL
Cholesterol: 174 mg/dL (ref 125–200)
HDL: 46 mg/dL (ref >=46)
LDL Cholesterol: 115 mg/dL (ref <130)
Total CHOL/HDL Ratio: 3.8 Ratio (ref <=5.0)
Triglycerides: 65 mg/dL (ref <150)
VLDL: 13 mg/dL (ref <30)

## 2015-08-21 LAB — COMPREHENSIVE METABOLIC PANEL
ALT: 20 U/L (ref 6–29)
AST: 21 U/L (ref 10–35)
Albumin: 4.2 g/dL (ref 3.6–5.1)
Alkaline Phosphatase: 93 U/L (ref 33–115)
BUN: 10 mg/dL (ref 7–25)
CO2: 26 mmol/L (ref 20–31)
Calcium: 9.2 mg/dL (ref 8.6–10.2)
Chloride: 103 mmol/L (ref 98–110)
Creat: 0.82 mg/dL (ref 0.50–1.10)
Glucose, Bld: 118 mg/dL — ABNORMAL HIGH (ref 65–99)
Potassium: 4 mmol/L (ref 3.5–5.3)
Sodium: 138 mmol/L (ref 135–146)
Total Bilirubin: 1.1 mg/dL (ref 0.2–1.2)
Total Protein: 7.3 g/dL (ref 6.1–8.1)

## 2015-08-21 LAB — CBC
HCT: 40.5 % (ref 35.0–45.0)
Hemoglobin: 14 g/dL (ref 11.7–15.5)
MCH: 31.3 pg (ref 27.0–33.0)
MCHC: 34.6 g/dL (ref 32.0–36.0)
MCV: 90.4 fL (ref 80.0–100.0)
MPV: 11.3 fL (ref 7.5–12.5)
Platelets: 227 10*3/uL (ref 140–400)
RBC: 4.48 MIL/uL (ref 3.80–5.10)
RDW: 14.1 % (ref 11.0–15.0)
WBC: 8.9 10*3/uL (ref 3.8–10.8)

## 2015-08-21 NOTE — Patient Instructions (Signed)

## 2015-08-21 NOTE — Progress Notes (Signed)
Patient ID: Carrie Gordon, female   DOB: March 16, 1971, 45 y.o.   MRN: [id number] 45 y.o. G1P1001 married [place] American female here for annual exam.    Husband is present for the entire visit.   Not happy about weight gain.  Lost 10 - 20 pounds but gained it back when she went on a cruise.  No hot flashes.  No vaginal dryness.   Some leakage with sneeze or cough.  Declines care for this.   PCP:   Lilyan Punt, MD  No LMP recorded. Patient has had a hysterectomy.           Sexually active: Yes.   husband The current method of family planning is status post hysterectomy.    Exercising: No.   Smoker:  no  Health Maintenance: Pap: 05/2012 VHQ:IONGEX of HPV testing History of abnormal Pap:  Yes,remote past had abnormal pap. No treatment. Mammogram April 2016 - BIRADS 1, cat C density.  Colonoscopy:  n/a BMD:   n/a  Result  n/a TDaP:  07-15-13 Gardasil:   N/A HIV:  Pregnancy - negative.  Hep C:  NA Screening Labs:  Hb today: PCP, Urine today: unable to void   reports that she has never smoked. She has never used smokeless tobacco. She reports that she drinks about 1.8 - 2.4 oz of alcohol per week. She reports that she does not use illicit drugs.  Past Medical History  Diagnosis Date  . Migraines   . Back problem   . Uterine fibroid   . Bursitis of shoulder, right   . Anxiety   . Depression     Past Surgical History  Procedure Laterality Date  . Abdominal hysterectomy  2010    [facility]--Dr. Edward Jolly  . Myomectomy  2006    -Dr. Edward Jolly  . Tubal ligation  2002    -Dr. Edward Jolly    Current Outpatient Prescriptions  Medication Sig Dispense Refill  . ALPRAZolam (XANAX) 1 MG tablet TAKE 1/2 TO 1 TABLET BY MOUTH TWICE DAILY AS NEEDED 40 tablet 0  . citalopram (CELEXA) 20 MG tablet Take 1 tablet (20 mg total) by mouth daily. 30 tablet 10  . HYDROcodone-acetaminophen (NORCO/VICODIN) 5-325 MG per tablet Take 1-2 tablets by mouth every 4 (four) hours as needed. 10 tablet 0  .  oxyCODONE-acetaminophen (PERCOCET/ROXICET) 5-325 MG per tablet Take 1 tablet by mouth every 4 (four) hours as needed. 30 tablet 0   No current facility-administered medications for this visit.    Family History  Problem Relation Age of Onset  . Adopted: Yes  . Family history unknown: Yes    ROS:  Pertinent items are noted in HPI.  Otherwise, a comprehensive ROS was negative.  Exam:   BP 122/84 mmHg  Pulse 80  Resp 18  Ht 5\' 5"  (1.651 m)  Wt 188 lb 6.4 oz (85.458 kg)  BMI 31.35 kg/m2    General appearance: alert, cooperative and appears stated age Head: Normocephalic, without obvious abnormality, atraumatic Neck: no adenopathy, supple, symmetrical, trachea midline and thyroid normal to inspection and palpation Lungs: clear to auscultation bilaterally Breasts: normal appearance, no masses or tenderness, Inspection negative, No nipple retraction or dimpling, No nipple discharge or bleeding, No axillary or supraclavicular adenopathy Heart: regular rate and rhythm Abdomen: incisions:  Yes.     Pfannensteil , soft, non-tender; no masses, no organomegaly Extremities: extremities normal, atraumatic, no cyanosis or edema Skin: Skin color, texture, turgor normal. No rashes or lesions Lymph nodes: Cervical, supraclavicular, and axillary nodes  normal. No abnormal inguinal nodes palpated Neurologic: Grossly normal  Pelvic: External genitalia:  no lesions              Urethra:  normal appearing urethra with no masses, tenderness or lesions              Bartholins and Skenes: normal                 Vagina: normal appearing vagina with normal color and discharge, no lesions              Cervix: absent              Pap taken: No. Bimanual Exam:  Uterus:  uterus absent              Adnexa: normal adnexa and no mass, fullness, tenderness              Rectal exam: Yes.  .  Confirms.              Anus:  normal sphincter tone, no lesions  Chaperone was present for exam.  Assessment:   Well  woman visit with normal exam. Status post TAH. Remote history of abnormal pap. Weight gain.   Plan: Yearly mammogram recommended after age 21.  Recommended self breast exam.  Pap and HR HPV as above. Recommendations for Calcium, Vitamin D, regular exercise program including cardiovascular and weight bearing exercise. Weight loss plan discussed. Labs performed.  Yes.  .   See orders.  Routine labs. Prescription medication(s) given.  No..    Follow up annually and prn.      After visit summary provided.

## 2015-08-22 LAB — HEMOGLOBIN, FINGERSTICK: Hemoglobin, fingerstick: 14.1 g/dL (ref 12.0–16.0)

## 2015-08-23 ENCOUNTER — Other Ambulatory Visit: Payer: Self-pay | Admitting: Obstetrics and Gynecology

## 2015-08-23 ENCOUNTER — Encounter: Payer: Self-pay | Admitting: Obstetrics and Gynecology

## 2015-08-23 DIAGNOSIS — R7309 Other abnormal glucose: Secondary | ICD-10-CM

## 2015-08-23 LAB — HEMOGLOBIN A1C
Hgb A1c MFr Bld: 5.9 % — ABNORMAL HIGH (ref <5.7)
Mean Plasma Glucose: 123 mg/dL

## 2015-09-13 ENCOUNTER — Other Ambulatory Visit: Payer: Self-pay | Admitting: Family Medicine

## 2015-10-20 ENCOUNTER — Other Ambulatory Visit: Payer: Self-pay | Admitting: Obstetrics and Gynecology

## 2015-10-20 DIAGNOSIS — Z1231 Encounter for screening mammogram for malignant neoplasm of breast: Secondary | ICD-10-CM

## 2015-11-01 ENCOUNTER — Ambulatory Visit
Admission: RE | Admit: 2015-11-01 | Discharge: 2015-11-01 | Disposition: A | Discharge status: Home / Self Care | Payer: BLUE CROSS/BLUE SHIELD | Source: Ambulatory Visit | Attending: Obstetrics and Gynecology | Admitting: Obstetrics and Gynecology

## 2015-11-01 DIAGNOSIS — Z1231 Encounter for screening mammogram for malignant neoplasm of breast: Secondary | ICD-10-CM

## 2015-11-15 ENCOUNTER — Ambulatory Visit (INDEPENDENT_AMBULATORY_CARE_PROVIDER_SITE_OTHER): Payer: BLUE CROSS/BLUE SHIELD | Admitting: Family Medicine

## 2015-11-15 ENCOUNTER — Encounter: Payer: Self-pay | Admitting: Family Medicine

## 2015-11-15 VITALS — BP 116/74 | Ht 65.5 in | Wt 186.0 lb

## 2015-11-15 DIAGNOSIS — R7303 Prediabetes: Secondary | ICD-10-CM | POA: Diagnosis not present

## 2015-11-15 MED ORDER — ALPRAZOLAM 1 MG PO TABS: ORAL_TABLET | ORAL | Status: DC

## 2015-11-15 NOTE — Patient Instructions (Signed)
Prediabetes Eating Plan Prediabetes--also called impaired glucose tolerance or impaired fasting glucose--is a condition that causes blood sugar (blood glucose) levels to be higher than normal. Following a healthy diet can help to keep prediabetes under control. It can also help to lower the risk of type 2 diabetes and heart disease, which are increased in people who have prediabetes. Along with regular exercise, a healthy diet:  Promotes weight loss.  Helps to control blood sugar levels.  Helps to improve the way that the body uses insulin. WHAT DO I NEED TO KNOW ABOUT THIS EATING PLAN?  Use the glycemic index (GI) to plan your meals. The index tells you how quickly a food will raise your blood sugar. Choose low-GI foods. These foods take a longer time to raise blood sugar.  Pay close attention to the amount of carbohydrates in the food that you eat. Carbohydrates increase blood sugar levels.  Keep track of how many calories you take in. Eating the right amount of calories will help you to achieve a healthy weight. Losing about 7 percent of your starting weight can help to prevent type 2 diabetes.  You may want to follow a Mediterranean diet. This diet includes a lot of vegetables, lean meats or fish, whole grains, fruits, and healthy oils and fats. WHAT FOODS CAN I EAT? Grains Whole grains, such as whole-wheat or whole-grain breads, crackers, cereals, and pasta. Unsweetened oatmeal. Bulgur. Barley. Quinoa. Brown rice. Corn or whole-wheat flour tortillas or taco shells. Vegetables Lettuce. Spinach. Peas. Beets. Cauliflower. Cabbage. Broccoli. Carrots. Tomatoes. Squash. Eggplant. Herbs. Peppers. Onions. Cucumbers. Brussels sprouts. Fruits Berries. Bananas. Apples. Oranges. Grapes. Papaya. Mango. Pomegranate. Kiwi. Grapefruit. Cherries. Meats and Other Protein Sources Seafood. Lean meats, such as chicken and turkey or lean cuts of pork and beef. Tofu. Eggs. Nuts. Beans. Dairy Low-fat or  fat-free dairy products, such as yogurt, cottage cheese, and cheese. Beverages Water. Tea. Coffee. Sugar-free or diet soda. Seltzer water. Milk. Milk alternatives, such as soy or almond milk. Condiments Mustard. Relish. Low-fat, low-sugar ketchup. Low-fat, low-sugar barbecue sauce. Low-fat or fat-free mayonnaise. Sweets and Desserts Sugar-free or low-fat pudding. Sugar-free or low-fat ice cream and other frozen treats. Fats and Oils Avocado. Walnuts. Olive oil. The items listed above may not be a complete list of recommended foods or beverages. Contact your dietitian for more options.  WHAT FOODS ARE NOT RECOMMENDED? Grains Refined white flour and flour products, such as bread, pasta, snack foods, and cereals. Beverages Sweetened drinks, such as sweet iced tea and soda. Sweets and Desserts Baked goods, such as cake, cupcakes, pastries, cookies, and cheesecake. The items listed above may not be a complete list of foods and beverages to avoid. Contact your dietitian for more information.   This information is not intended to replace advice given to you by your health care provider. Make sure you discuss any questions you have with your health care provider.   Document Released: 09/13/2014 Document Reviewed: 09/13/2014 Elsevier Interactive Patient Education 2016 [employer]  

## 2015-11-15 NOTE — Progress Notes (Signed)
Subjective:    Patient ID: Carrie Gordon, female    DOB: 1971-04-12, 45 y.o.   MRN: [id number]  Migraine  This is a chronic problem.   Patient in today for a medication check. Patient states no other concerns this visit. Patient denies being depressed at times feels slightly stressed denies abusing Xanax states it does help Gets occasional headaches. Medication uses Tylenol seems to help Recently diagnosis prediabetic patient is adopted. Denies being depressed Review of Systems Denies chest tightness pressure pain shortness breath excessive thirst denies headaches currently    Objective:   Physical Exam  Lungs are clear hearts regular pulse normal BP good      Assessment & Plan:  Prediabetes check A1c in approximately one month watch diet exercise on a regular basis  Migraines are stable states Tylenol does the job does not need any other medicines  Occasional stress-related issues Xanax on a when necessary basis does not use often refill requested and given

## 2015-12-27 LAB — HEMOGLOBIN A1C
Est. average glucose Bld gHb Est-mCnc: 117 mg/dL
Hgb A1c MFr Bld: 5.7 % — ABNORMAL HIGH (ref 4.8–5.6)

## 2016-01-05 ENCOUNTER — Telehealth: Payer: Self-pay | Admitting: Family Medicine

## 2016-01-05 NOTE — Telephone Encounter (Signed)
A1c several months ago was 5.9 now it is 5.7 this is a significant improvement. Healthy eating regular physical activity area

## 2016-01-05 NOTE — Telephone Encounter (Signed)
Spoke with patient and informed her per Dr.Scott Luking- A1c several months ago was 5.9 now it is 5.7 this is a significant improvement. Healthy eating regular physical activity. Patient verbalized understanding.

## 2016-01-05 NOTE — Telephone Encounter (Signed)
Pt called wanting to know the results to her recent A1C

## 2016-05-10 ENCOUNTER — Ambulatory Visit: Payer: BLUE CROSS/BLUE SHIELD | Admitting: Obstetrics and Gynecology

## 2016-05-17 ENCOUNTER — Ambulatory Visit: Payer: BLUE CROSS/BLUE SHIELD | Admitting: Family Medicine

## 2016-05-28 ENCOUNTER — Ambulatory Visit: Payer: BLUE CROSS/BLUE SHIELD | Admitting: Family Medicine

## 2016-06-04 ENCOUNTER — Encounter: Payer: Self-pay | Admitting: Nurse Practitioner

## 2016-06-04 ENCOUNTER — Ambulatory Visit: Payer: BLUE CROSS/BLUE SHIELD | Admitting: Nurse Practitioner

## 2016-06-04 VITALS — BP 116/68 | HR 64 | Ht 65.5 in | Wt 155.0 lb

## 2016-06-04 DIAGNOSIS — N760 Acute vaginitis: Secondary | ICD-10-CM

## 2016-06-04 MED ORDER — FLUCONAZOLE 150 MG PO TABS: 150.0000 mg | ORAL_TABLET | Freq: Once | ORAL | 0 refills | Status: AC

## 2016-06-04 NOTE — Patient Instructions (Signed)

## 2016-06-04 NOTE — Progress Notes (Signed)
Patient ID: Carrie Gordon, female   DOB: 08-11-1970, 46 y.o.   MRN: [id number]  46 y.o. [name] G1P1001 here with complaint of vaginal symptoms of itching, and increase discharge. Describes discharge as white and thick.   No recent antibiotics.    Onset of symptoms 7 days ago. Denies new personal products or vaginal dryness.    No STD concerns, no change on partner X 15 yrs.. Urinary symptoms none . Contraception is hysterectomy.  She has lost weight due to pre diabetic state and has been walking 3-4 miles per day. Does not always change clothes after walking.   O:  Healthy female WDWN Affect: normal, orientation x 3  Exam: no distress Abdomen: non tender Lymph node: no enlargement or tenderness Pelvic exam: External genital: normal female BUS: negative Vagina: white thick discharge noted.  Affirm taken.     A: Vaginitis - most likely yeast or combined with BV  S/P TAH   P: Discussed findings of vaginitis and etiology. Discussed Aveeno or baking soda sitz bath for comfort. Avoid moist clothes or pads for extended period of time. If working out in gym clothes or swim suits for long periods of time change underwear or bottoms of swimsuit if possible. Olive Oil/Coconut Oil use for skin protection prior to activity can be used to external skin.  Rx: went ahead and started Diflucan for symptoms relief  Follow with Affirm  RV prn

## 2016-06-05 ENCOUNTER — Other Ambulatory Visit: Payer: Self-pay | Admitting: Nurse Practitioner

## 2016-06-05 LAB — WET PREP BY MOLECULAR PROBE
Candida species: NEGATIVE
Gardnerella vaginalis: POSITIVE — AB
Trichomonas vaginosis: NEGATIVE

## 2016-06-05 MED ORDER — METRONIDAZOLE 0.75 % VA GEL: 1.0000 | Freq: Every day | VAGINAL | 0 refills | Status: DC

## 2016-06-06 ENCOUNTER — Ambulatory Visit: Payer: BLUE CROSS/BLUE SHIELD | Admitting: Obstetrics and Gynecology

## 2016-06-09 NOTE — Progress Notes (Signed)
Encounter reviewed by Dr. Brook Amundson C. Silva.  

## 2016-06-19 ENCOUNTER — Other Ambulatory Visit: Payer: Self-pay | Admitting: Family Medicine

## 2016-06-20 NOTE — Telephone Encounter (Signed)
May have this +3 additional refills 

## 2016-08-21 NOTE — Progress Notes (Signed)
46 y.o. G38P1001 [name] here for annual exam.    Lost 34 pounds since her visit last year.   Planning to sell her home and build a new home. Some stress with changes.  Not sleeping well.  Using Xanax nightly.  Some nipple soreness bilaterally.  This was accompanied by LLQ pain.  Has long standing nipple discharge with squeezing bilaterally.  Off antidepressants for one year. Has had diagnostic mammograms and normal thyroid testing and prolactin.  Had BV in Jan. 2018. Metrogel treated this.   Does not know much about family of origin other than a history of hepatitis of maternal grandfather.   PCP:  Sallee Lange, MD   No LMP recorded. Patient has had a hysterectomy.           Sexually active: Yes.   female The current method of family planning is status post hysterectomy.    Exercising: Yes.    walking Smoker:  no  Health Maintenance: Pap: 05/2012 QQI:WLNLGX of HPV testing History of abnormal Pap:  Yes, remote past had abnormal pap. No treatment. MMG: 11-01-15 Density C/Neg/BiRads1:TBC Colonoscopy: n/a BMD:   n/a  Result  n/a TDaP:  07-15-13 Gardasil:   n/a QJJ:HERDEY pregnancy--28 yrs. Ago--negative Hep C: NA  Screening Labs:  Hb today: drawn today, Urine today: not done   reports that she has never smoked. She has never used smokeless tobacco. She reports that she drinks about 1.8 - 2.4 oz of alcohol per week . She reports that she does not use drugs.  Past Medical History:  Diagnosis Date  . Anxiety   . Back problem   . Bursitis of shoulder, right   . Depression   . Elevated hemoglobin A1c 2017  . Migraines   . Uterine fibroid     Past Surgical History:  Procedure Laterality Date  . ABDOMINAL HYSTERECTOMY  2010   TAH--Dr. Quincy Simmonds  . MYOMECTOMY  2006   -Dr. Quincy Simmonds  . TUBAL LIGATION  2002   -Dr. Quincy Simmonds    Current Outpatient Prescriptions  Medication Sig Dispense Refill  . ALPRAZolam (XANAX) 1 MG tablet TAKE 1/2- 1 TABLET BY MOUTH TWICE  DAILY AS NEEDED 40 tablet 3   No current facility-administered medications for this visit.     Family History  Problem Relation Age of Onset  . Adopted: Yes  . Family history unknown: Yes    ROS:  Pertinent items are noted in HPI.  Otherwise, a comprehensive ROS was negative.  Exam:   BP 122/80 (BP Location: Right Arm, Patient Position: Sitting, Cuff Size: Normal)   Pulse 70   Resp 14   Ht 5' 4.5" (1.638 m)   Wt 154 lb 6.4 oz (70 kg)   BMI 26.09 kg/m     General appearance: alert, cooperative and appears stated age Head: Normocephalic, without obvious abnormality, atraumatic Neck: no adenopathy, supple, symmetrical, trachea midline and thyroid normal to inspection and palpation Lungs: clear to auscultation bilaterally Breasts: normal appearance, no masses or tenderness, No nipple retraction or dimpling, No nipple discharge or bleeding, No axillary or supraclavicular adenopathy Heart: regular rate and rhythm Abdomen: soft, non-tender; no masses, no organomegaly Extremities: extremities normal, atraumatic, no cyanosis or edema Skin: Skin color, texture, turgor normal. No rashes or lesions Lymph nodes: Cervical, supraclavicular, and axillary nodes normal. No abnormal inguinal nodes palpated Neurologic: Grossly normal  Pelvic: External genitalia:  no lesions              Urethra:  normal  appearing urethra with no masses, tenderness or lesions              Bartholins and Skenes: normal                 Vagina: normal appearing vagina with normal color and discharge, no lesions              Cervix:  absent              Pap taken: No. Bimanual Exam:  Uterus:   Absent.              Adnexa: no mass, fullness, tenderness              Rectal exam: Yes.  .  Confirms.              Anus:  normal sphincter tone, no lesions  Chaperone was present for exam.  Assessment:   Well woman visit with normal exam. Status post TAH. Remote history of abnormal pap. Chronic bilateral nipple  discharge.  May be due to fibrocystic change. Situational stress and difficulty sleeping.  Plan: Mammogram screening discussed. Recommended self breast awareness. Pap and HR HPV as above. Guidelines for Calcium, Vitamin D, regular exercise program including cardiovascular and weight bearing exercise. Routine labs and Hgb A1C.  Congratulations on weight loss.  Try Melatonin for sleeping aide. Try to reduce use of Xanax.  Discussed relaxation techniques. Follow up annually and prn.    After visit summary provided.

## 2016-08-22 ENCOUNTER — Encounter: Payer: Self-pay | Admitting: Obstetrics and Gynecology

## 2016-08-22 ENCOUNTER — Ambulatory Visit (INDEPENDENT_AMBULATORY_CARE_PROVIDER_SITE_OTHER): Payer: BLUE CROSS/BLUE SHIELD | Admitting: Obstetrics and Gynecology

## 2016-08-22 VITALS — BP 122/80 | HR 70 | Resp 14 | Ht 64.5 in | Wt 154.4 lb

## 2016-08-22 DIAGNOSIS — R7309 Other abnormal glucose: Secondary | ICD-10-CM

## 2016-08-22 DIAGNOSIS — Z01419 Encounter for gynecological examination (general) (routine) without abnormal findings: Secondary | ICD-10-CM

## 2016-08-22 LAB — CBC
HCT: 39.6 % (ref 35.0–45.0)
Hemoglobin: 13.4 g/dL (ref 11.7–15.5)
MCH: 30.5 pg (ref 27.0–33.0)
MCHC: 33.8 g/dL (ref 32.0–36.0)
MCV: 90 fL (ref 80.0–100.0)
MPV: 10.7 fL (ref 7.5–12.5)
Platelets: 224 10*3/uL (ref 140–400)
RBC: 4.4 MIL/uL (ref 3.80–5.10)
RDW: 14.2 % (ref 11.0–15.0)
WBC: 8.4 10*3/uL (ref 3.8–10.8)

## 2016-08-22 LAB — TSH: TSH: 1.29 mIU/L

## 2016-08-22 NOTE — Patient Instructions (Signed)

## 2016-08-23 LAB — COMPREHENSIVE METABOLIC PANEL
ALT: 27 U/L (ref 6–29)
AST: 23 U/L (ref 10–35)
Albumin: 4.1 g/dL (ref 3.6–5.1)
Alkaline Phosphatase: 93 U/L (ref 33–115)
BUN: 10 mg/dL (ref 7–25)
CO2: 25 mmol/L (ref 20–31)
Calcium: 8.9 mg/dL (ref 8.6–10.2)
Chloride: 101 mmol/L (ref 98–110)
Creat: 0.82 mg/dL (ref 0.50–1.10)
Glucose, Bld: 83 mg/dL (ref 65–99)
Potassium: 3.6 mmol/L (ref 3.5–5.3)
Sodium: 136 mmol/L (ref 135–146)
Total Bilirubin: 0.9 mg/dL (ref 0.2–1.2)
Total Protein: 7 g/dL (ref 6.1–8.1)

## 2016-08-23 LAB — LIPID PANEL
Cholesterol: 158 mg/dL (ref <200)
HDL: 58 mg/dL (ref >50)
LDL Cholesterol: 89 mg/dL (ref <100)
Total CHOL/HDL Ratio: 2.7 Ratio (ref <5.0)
Triglycerides: 54 mg/dL (ref <150)
VLDL: 11 mg/dL (ref <30)

## 2016-08-23 LAB — HEMOGLOBIN A1C
Hgb A1c MFr Bld: 5.3 % (ref <5.7)
Mean Plasma Glucose: 105 mg/dL

## 2016-09-20 ENCOUNTER — Other Ambulatory Visit: Payer: Self-pay | Admitting: Obstetrics and Gynecology

## 2016-09-20 DIAGNOSIS — Z1231 Encounter for screening mammogram for malignant neoplasm of breast: Secondary | ICD-10-CM

## 2016-12-06 ENCOUNTER — Telehealth: Payer: Self-pay | Admitting: Family Medicine

## 2016-12-06 ENCOUNTER — Other Ambulatory Visit: Payer: Self-pay | Admitting: *Deleted

## 2016-12-06 MED ORDER — ALPRAZOLAM 1 MG PO TABS: ORAL_TABLET | ORAL | 0 refills | Status: DC

## 2016-12-06 NOTE — Telephone Encounter (Signed)
Pt notified script faxed to [facility] in [place] on [address].

## 2016-12-06 NOTE — Telephone Encounter (Signed)
Requesting Rx for Xanax to [facility].  She has a followup appointment on 12/19/16 with Dr. Nicki Reaper.

## 2016-12-06 NOTE — Telephone Encounter (Signed)
1 refill on Xanax permitted-keep follow-up visit

## 2016-12-18 ENCOUNTER — Ambulatory Visit
Admission: RE | Admit: 2016-12-18 | Discharge: 2016-12-18 | Disposition: A | Discharge status: Home / Self Care | Payer: BLUE CROSS/BLUE SHIELD | Source: Ambulatory Visit | Attending: Obstetrics and Gynecology | Admitting: Obstetrics and Gynecology

## 2016-12-18 DIAGNOSIS — Z1231 Encounter for screening mammogram for malignant neoplasm of breast: Secondary | ICD-10-CM

## 2016-12-19 ENCOUNTER — Encounter: Payer: Self-pay | Admitting: Family Medicine

## 2016-12-19 ENCOUNTER — Ambulatory Visit (INDEPENDENT_AMBULATORY_CARE_PROVIDER_SITE_OTHER): Payer: BLUE CROSS/BLUE SHIELD | Admitting: Family Medicine

## 2016-12-19 ENCOUNTER — Telehealth: Payer: Self-pay | Admitting: Family Medicine

## 2016-12-19 VITALS — BP 126/82 | Ht 64.5 in | Wt 156.8 lb

## 2016-12-19 DIAGNOSIS — G4700 Insomnia, unspecified: Secondary | ICD-10-CM | POA: Diagnosis not present

## 2016-12-19 DIAGNOSIS — Z1211 Encounter for screening for malignant neoplasm of colon: Secondary | ICD-10-CM

## 2016-12-19 MED ORDER — TRAZODONE HCL 50 MG PO TABS: 25.0000 mg | ORAL_TABLET | Freq: Every evening | ORAL | 5 refills | Status: DC | PRN

## 2016-12-19 MED ORDER — ALPRAZOLAM 1 MG PO TABS: ORAL_TABLET | ORAL | 5 refills | Status: DC

## 2016-12-19 NOTE — Telephone Encounter (Signed)
Referral to gastroenterology for colonoscopy  

## 2016-12-19 NOTE — Progress Notes (Signed)
Subjective:    Patient ID: Carrie Gordon, female    DOB: 1971-01-28, 46 y.o.   MRN: [id number]  HPI Patient arrives for a follow up on anxiety. Patient states she has been having trouble sleeping lately- has tried melatonin but not really helping.  Patient relates some anxiousness but not bad but she also states she typically takes Xanax at night help her sleep [place] she states she's had a hard time going to sleep hard time staying asleep finds herself stressed out about behind the house denies being depressed Review of Systems Negative chest tightness pressure pain shortness breath    Objective:   Physical Exam  Lungs clear heart regular pulse normal BP good      Assessment & Plan:  Insomnia I've encouraged her not use Xanax at bedtime She may use it during the day as needed for anxiety Try trazodone at nighttime Sleep hygiene handout given to the patient as well If progressive troubles follow-up Otherwise recheck in one year

## 2016-12-19 NOTE — Telephone Encounter (Signed)
Referral ordered in EPIC. 

## 2016-12-19 NOTE — Telephone Encounter (Signed)
Patient said that she checked with her insurance company as instructed by Dr. Nicki Reaper and was told that they will cover a colonoscopy at 100% as long as its coded as a preventative.  Also, if any polyps are found as long as medically necessary.

## 2016-12-24 ENCOUNTER — Encounter: Payer: Self-pay | Admitting: Family Medicine

## 2016-12-25 ENCOUNTER — Encounter: Payer: Self-pay | Admitting: Gastroenterology

## 2017-01-08 ENCOUNTER — Encounter: Payer: Self-pay | Admitting: Family Medicine

## 2017-01-08 ENCOUNTER — Ambulatory Visit (INDEPENDENT_AMBULATORY_CARE_PROVIDER_SITE_OTHER): Payer: BLUE CROSS/BLUE SHIELD | Admitting: Family Medicine

## 2017-01-08 VITALS — BP 130/80 | Ht 64.5 in | Wt 154.1 lb

## 2017-01-08 DIAGNOSIS — G542 Cervical root disorders, not elsewhere classified: Secondary | ICD-10-CM | POA: Diagnosis not present

## 2017-01-08 MED ORDER — DICLOFENAC SODIUM 75 MG PO TBEC: 75.0000 mg | DELAYED_RELEASE_TABLET | Freq: Two times a day (BID) | ORAL | 0 refills | Status: DC

## 2017-01-08 NOTE — Progress Notes (Signed)
Subjective:    Patient ID: Carrie Gordon, female    DOB: 05-23-1970, 46 y.o.   MRN: [id number]  Neck Pain   This is a new problem. The current episode started in the past 7 days. The pain is present in the right side. Associated symptoms comments: Shoulder pain . She has tried acetaminophen and NSAIDs for the symptoms.   Patient states no other concerns this visit.  Review of Systems  Musculoskeletal: Positive for neck pain.  Denies radiation down the arm denies weakness in the arm no chest tightness pressure pain no fever chills cough vomiting or diarrhea     Objective:   Physical Exam Lungs clear heart regular tenderness in the right side neck no weakness into the arms good range of motion reflexes good pain with motion  15 minutes spent with patient greater than half in discussion of various issues/questions     Assessment & Plan:  Impingement of nerve in the neck should gradually get better patient does not want to be on steroids recommend diclofenac twice a day over the course of next 2 weeks if ongoing troubles or if worse notify us. No need for x-rays or MRI currently less kits worse weakness not detected warning signs was discussed

## 2017-02-18 ENCOUNTER — Ambulatory Visit: Payer: BLUE CROSS/BLUE SHIELD | Admitting: Gastroenterology

## 2017-03-31 ENCOUNTER — Encounter: Payer: Self-pay | Admitting: Gastroenterology

## 2017-04-15 ENCOUNTER — Ambulatory Visit: Payer: BLUE CROSS/BLUE SHIELD | Admitting: Gastroenterology

## 2017-04-15 ENCOUNTER — Telehealth: Payer: Self-pay | Admitting: Gastroenterology

## 2017-04-15 ENCOUNTER — Encounter: Payer: Self-pay | Admitting: Gastroenterology

## 2017-04-15 VITALS — BP 120/80 | HR 72 | Ht 64.5 in | Wt 159.4 lb

## 2017-04-15 DIAGNOSIS — K5909 Other constipation: Secondary | ICD-10-CM

## 2017-04-15 DIAGNOSIS — Z1211 Encounter for screening for malignant neoplasm of colon: Secondary | ICD-10-CM

## 2017-04-15 MED ORDER — PEG-KCL-NACL-NASULF-NA ASC-C 140 G PO SOLR
140.0000 g | ORAL | 0 refills | Status: DC
Start: 2017-04-15 — End: 2017-04-28

## 2017-04-15 NOTE — Progress Notes (Signed)
[facility] Consult Note:  History: Carrie Gordon 04/15/2017  Referring physician: Kathyrn Drown, MD  Reason for consult/chief complaint: Colon Cancer Screening (1st colon, patient has no compliants)   Subjective  HPI:  This is a 46 year old woman referred by primary care for consideration of colon cancer screening.  She is African-American, and does not know her family history because she was adopted.  She has tended toward constipation for many years, for which she takes an herbal laxative with reasonably good results.  There is been no change in the symptoms recently, and she denies rectal bleeding. We discussed the current guidelines for colorectal cancer screening.  In general, the average risk screening age is 9.  Some data supports starting at age 34 with African-Americans, and this has now been endorsed by the ACG.  However, there has not been a Scientist, physiological coverage for that earlier screening for African-Americans.  Carrie Gordon reports that she and her husband called her insurance company, and were told a screening colonoscopy would be covered.  ROS:  Review of Systems  Constitutional: Negative for appetite change and unexpected weight change.  HENT: Negative for mouth sores and voice change.   Eyes: Negative for pain and redness.  Respiratory: Negative for cough and shortness of breath.   Cardiovascular: Negative for chest pain and palpitations.  Genitourinary: Negative for dysuria and hematuria.  Musculoskeletal: Negative for arthralgias and myalgias.  Skin: Negative for pallor and rash.  Neurological: Negative for weakness and headaches.  Hematological: Negative for adenopathy.     Past Medical History: Past Medical History:  Diagnosis Date  . Anxiety   . Back problem   . Bursitis of shoulder, right   . Depression   . Elevated hemoglobin A1c 2017  . Migraines   . Uterine fibroid      Past Surgical History: Past  Surgical History:  Procedure Laterality Date  . ABDOMINAL HYSTERECTOMY  2010   TAH--Dr. Quincy Simmonds  . MYOMECTOMY  2006   -Dr. Quincy Simmonds  . TUBAL LIGATION  2002   -Dr. Quincy Simmonds     Family History: Family History  Adopted: Yes  Problem Relation Age of Onset  . Breast cancer Neg Hx     Social History: Social History   Socioeconomic History  . Marital status: Married    Spouse name: None  . Number of children: 1  . Years of education: None  . Highest education level: None  Social Needs  . Financial resource strain: None  . Food insecurity - worry: None  . Food insecurity - inability: None  . Transportation needs - medical: None  . Transportation needs - non-medical: None  Occupational History  . None  Tobacco Use  . Smoking status: Never Smoker  . Smokeless tobacco: Never Used  Substance and Sexual Activity  . Alcohol use: Yes    Alcohol/week: 1.8 - 2.4 oz    Types: 3 - 4 Standard drinks or equivalent per week    Comment: 3-4 glasses of wine/week  . Drug use: No  . Sexual activity: Yes    Partners: Male    Birth control/protection: Surgical    Comment: TAH  Other Topics Concern  . None  Social History Narrative  . None   She is a Forensic scientist.  Allergies: No Known Allergies  Outpatient Meds: Current Outpatient Medications  Medication Sig Dispense Refill  . ALPRAZolam (XANAX) 1 MG tablet TAKE 1/2- 1 TABLET BY MOUTH TWICE DAILY AS NEEDED 40 tablet 5  .  diclofenac (VOLTAREN) 75 MG EC tablet Take 1 tablet (75 mg total) by mouth 2 (two) times daily. 30 tablet 0  . traZODone (DESYREL) 50 MG tablet Take 0.5-1 tablets (25-50 mg total) by mouth at bedtime as needed for sleep. 30 tablet 5  . PEG-KCl-NaCl-NaSulf-Na Asc-C (PLENVU) 140 g SOLR Take 140 g by mouth as directed. 1 each 0   No current facility-administered medications for this visit.       ___________________________________________________________________ Objective   Exam:  BP 120/80   Pulse 72    Ht 5' 4.5" (1.638 m)   Wt 159 lb 6.4 oz (72.3 kg)   BMI 26.94 kg/m  Husband is present for the entire encounter  General: this is a(n) well-appearing woman  Eyes: sclera anicteric, no redness  ENT: oral mucosa moist without lesions, no cervical or supraclavicular lymphadenopathy, good dentition  CV: RRR without murmur, S1/S2, no JVD, no peripheral edema  Resp: clear to auscultation bilaterally, normal RR and effort noted  GI: soft, mild midline tenderness, with active bowel sounds. No guarding or palpable organomegaly noted.  Skin; warm and dry, no rash or jaundice noted  Neuro: awake, alert and oriented x 3. Normal gross motor function and fluent speech  Labs:  CBC Latest Ref Rng & Units 08/22/2016 08/21/2015 08/21/2015  WBC 3.8 - 10.8 K/uL 8.4 8.9 -  Hemoglobin 11.7 - 15.5 g/dL 13.4 14.0 14.1  Hematocrit 35.0 - 45.0 % 39.6 40.5 -  Platelets 140 - 400 K/uL 224 227 -     Assessment: Encounter Diagnoses  Name Primary?  . Special screening for malignant neoplasms, colon Yes  . Other constipation     Constipation is long-standing and probably not obstructive.  She meets criteria for earlier screening and believes her insurance would cover it.  Plan:  Colonoscopy  The benefits and risks of the planned procedure were described in detail with the patient or (when appropriate) their health care proxy.  Risks were outlined as including, but not limited to, bleeding, infection, perforation, adverse medication reaction leading to cardiac or pulmonary decompensation, or pancreatitis (if ERCP).  The limitation of incomplete mucosal visualization was also discussed.  No guarantees or warranties were given.   Thank you for the courtesy of this consult.  Please call me with any questions or concerns.  Nelida Meuse III  CC: Kathyrn Drown, MD

## 2017-04-15 NOTE — Telephone Encounter (Signed)
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 Pt requesting note faxed to [phone]

## 2017-04-15 NOTE — Patient Instructions (Signed)
If you are age 48 or older, your body mass index should be between 23-30. Your Body mass index is 26.94 kg/m. If this is out of the aforementioned range listed, please consider follow up with your Primary Care Provider.  If you are age 15 or younger, your body mass index should be between 19-25. Your Body mass index is 26.94 kg/m. If this is out of the aformentioned range listed, please consider follow up with your Primary Care Provider.   You have been scheduled for a colonoscopy. Please follow written instructions given to you at your visit today.  Please pick up your prep supplies at the pharmacy within the next 1-3 days. If you use inhalers (even only as needed), please bring them with you on the day of your procedure. Your physician has requested that you go to www.startemmi.com and enter the access code given to you at your visit today. This web site gives a general overview about your procedure. However, you should still follow specific instructions given to you by our office regarding your preparation for the procedure.  Thank you for choosing [facility]  Dr Wilfrid Lund III

## 2017-04-16 ENCOUNTER — Other Ambulatory Visit: Payer: Self-pay

## 2017-04-16 NOTE — Telephone Encounter (Signed)
Work note faxed as requested. 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

## 2017-04-16 NOTE — Telephone Encounter (Signed)
Can we give a work note?

## 2017-04-16 NOTE — Telephone Encounter (Signed)
Yes, that's fine. Just to be clear, she could work if she wants to on prep day, as oral prep does not start until late in the day.

## 2017-04-22 ENCOUNTER — Encounter: Payer: BLUE CROSS/BLUE SHIELD | Admitting: Gastroenterology

## 2017-04-28 ENCOUNTER — Other Ambulatory Visit: Payer: Self-pay

## 2017-04-28 ENCOUNTER — Encounter: Payer: Self-pay | Admitting: Gastroenterology

## 2017-04-28 ENCOUNTER — Ambulatory Visit (AMBULATORY_SURGERY_CENTER): Payer: BLUE CROSS/BLUE SHIELD | Admitting: Gastroenterology

## 2017-04-28 VITALS — BP 141/67 | HR 73 | Temp 97.5°F | Resp 25 | Ht 64.5 in | Wt 159.0 lb

## 2017-04-28 DIAGNOSIS — Z1211 Encounter for screening for malignant neoplasm of colon: Secondary | ICD-10-CM | POA: Diagnosis not present

## 2017-04-28 DIAGNOSIS — Z1212 Encounter for screening for malignant neoplasm of rectum: Secondary | ICD-10-CM | POA: Diagnosis not present

## 2017-04-28 MED ORDER — SODIUM CHLORIDE 0.9 % IV SOLN: 500.0000 mL | Freq: Once | INTRAVENOUS | Status: AC

## 2017-04-28 NOTE — Op Note (Signed)
[facility] Patient Name: Carrie Gordon Procedure Date: 04/28/2017 7:29 AM MRN: [id number] Endoscopist: Sherilyn Cooter L. Myrtie Neither , MD Age: 46 Referring MD:  Date of Birth: [birth year] Gender: Female Account #: [id number] Procedure:                Colonoscopy Indications:              Screening for colorectal malignant neoplasm, This                            is the patient's first colonoscopy                            (African-American > 45) Medicines:                Monitored Anesthesia Care Procedure:                Pre-Anesthesia Assessment:                           - Prior to the procedure, a History and Physical                            was performed, and patient medications and                            allergies were reviewed. The patient's tolerance of                            previous anesthesia was also reviewed. The risks                            and benefits of the procedure and the sedation                            options and risks were discussed with the patient.                            All questions were answered, and informed consent                            was obtained. Prior Anticoagulants: The patient has                            taken no previous anticoagulant or antiplatelet                            agents. ASA Grade Assessment: I - A normal, healthy                            patient. After reviewing the risks and benefits,                            the patient was deemed in satisfactory condition to  undergo the procedure.                           After obtaining informed consent, the colonoscope                            was passed under direct vision. Throughout the                            procedure, the patient's blood pressure, pulse, and                            oxygen saturations were monitored continuously. The                            Model PCF-H190DL [id number]) scope was introduced                            through the anus and advanced to the the cecum,                            identified by appendiceal orifice and ileocecal                            valve. The colonoscopy was performed without                            difficulty. The patient tolerated the procedure                            well. The quality of the bowel preparation was                            excellent. The ileocecal valve, appendiceal                            orifice, and rectum were photographed. The quality                            of the bowel preparation was evaluated using the                            BBPS [facility] Bowel Preparation Scale) with scores                            of: Right Colon = 3, Transverse Colon = 3 and Left                            Colon = 3 (entire mucosa seen well with no residual                            staining, small fragments of stool or opaque  liquid). The total BBPS score equals 9. The bowel                            preparation used was SUPREP. Scope In: 7:35:06 AM Scope Out: 7:50:35 AM Scope Withdrawal Time: 0 hours 11 minutes 26 seconds  Total Procedure Duration: 0 hours 15 minutes 29 seconds  Findings:                 The perianal and digital rectal examinations were                            normal.                           The entire examined colon appeared normal on direct                            and retroflexion views. Complications:            No immediate complications. Estimated Blood Loss:     Estimated blood loss: none. Impression:               - The entire examined colon is normal on direct and                            retroflexion views.                           - No specimens collected. Recommendation:           - Patient has a contact number available for                            emergencies. The signs and symptoms of potential                            delayed complications were  discussed with the                            patient. Return to normal activities tomorrow.                            Written discharge instructions were provided to the                            patient.                           - Resume previous diet.                           - Continue present medications.                           - Repeat colonoscopy in 10 years for screening                            purposes.  L.  Myrtie Neither, MD 04/28/2017 7:53:09 AM This report has been signed electronically.

## 2017-04-28 NOTE — Progress Notes (Signed)
Pt's states no medical or surgical changes since previsit or office visit. 

## 2017-04-28 NOTE — Patient Instructions (Signed)
YOU HAD AN ENDOSCOPIC PROCEDURE TODAY AT THE [facility]:   Refer to the procedure report that was given to you for any specific questions about what was found during the examination.  If the procedure report does not answer your questions, please call your gastroenterologist to clarify.  If you requested that your care partner not be given the details of your procedure findings, then the procedure report has been included in a sealed envelope for you to review at your convenience later.  YOU SHOULD EXPECT: Some feelings of bloating in the abdomen. Passage of more gas than usual.  Walking can help get rid of the air that was put into your GI tract during the procedure and reduce the bloating. If you had a lower endoscopy (such as a colonoscopy or flexible sigmoidoscopy) you may notice spotting of blood in your stool or on the toilet paper. If you underwent a bowel prep for your procedure, you may not have a normal bowel movement for a few days.  Please Note:  You might notice some irritation and congestion in your nose or some drainage.  This is from the oxygen used during your procedure.  There is no need for concern and it should clear up in a day or so.  SYMPTOMS TO REPORT IMMEDIATELY:   Following lower endoscopy (colonoscopy or flexible sigmoidoscopy):  Excessive amounts of blood in the stool  Significant tenderness or worsening of abdominal pains  Swelling of the abdomen that is new, acute  Fever of 100F or higher   For urgent or emergent issues, a gastroenterologist can be reached at any hour by calling [phone].   DIET:  We do recommend a small meal at first, but then you may proceed to your regular diet.  Drink plenty of fluids but you should avoid alcoholic beverages for 24 hours.  ACTIVITY:  You should plan to take it easy for the rest of today and you should NOT DRIVE or use heavy machinery until tomorrow (because of the sedation medicines used during the test).     FOLLOW UP: Our staff will call the number listed on your records the next business day following your procedure to check on you and address any questions or concerns that you may have regarding the information given to you following your procedure. If we do not reach you, we will leave a message.  However, if you are feeling well and you are not experiencing any problems, there is no need to return our call.  We will assume that you have returned to your regular daily activities without incident.  If any biopsies were taken you will be contacted by phone or by letter within the next 1-3 weeks.  Please call us at [phone] if you have not heard about the biopsies in 3 weeks.    SIGNATURES/CONFIDENTIALITY: You and/or your care partner have signed paperwork which will be entered into your electronic medical record.  These signatures attest to the fact that that the information above on your After Visit Summary has been reviewed and is understood.  Full responsibility of the confidentiality of this discharge information lies with you and/or your care-partner.  Thank you for letting us take care of your healthcare needs today. 

## 2017-04-28 NOTE — Progress Notes (Signed)
Report given to PACU, vss 

## 2017-04-29 ENCOUNTER — Telehealth: Payer: Self-pay

## 2017-04-29 NOTE — Telephone Encounter (Signed)
  Follow up Call-  Call back number 04/28/2017  Post procedure Call Back phone  # [phone]  Permission to leave phone message Yes  Some recent data might be hidden     Patient questions:  Do you have a fever, pain , or abdominal swelling? No. Pain Score  0 *  Have you tolerated food without any problems? Yes.    Have you been able to return to your normal activities? Yes.    Do you have any questions about your discharge instructions: Diet   No. Medications  No. Follow up visit  No.  Do you have questions or concerns about your Care? No.  Actions: * If pain score is 4 or above: No action needed, pain <4.

## 2017-06-27 ENCOUNTER — Telehealth: Payer: Self-pay | Admitting: Family Medicine

## 2017-06-27 ENCOUNTER — Other Ambulatory Visit: Payer: Self-pay | Admitting: Family Medicine

## 2017-06-27 MED ORDER — ALPRAZOLAM 1 MG PO TABS: ORAL_TABLET | ORAL | 0 refills | Status: DC

## 2017-06-27 NOTE — Telephone Encounter (Signed)
Pt is requesting refills on ALPRAZolam (XANAX) 1 MG tablet    [facility] - [place], [place]

## 2017-06-27 NOTE — Telephone Encounter (Signed)
Left message to return call 

## 2017-06-27 NOTE — Telephone Encounter (Signed)
This was refilled via phone message today

## 2017-06-27 NOTE — Telephone Encounter (Addendum)
Prescription faxed to pharmacy. Patient notified and scheduled follow up office visit. 

## 2017-06-27 NOTE — Telephone Encounter (Signed)
The patient may have 1 refill but she does need to be seen this is a controlled medication and federal law that requires we see the patient periodically at least every 6 months

## 2017-07-29 ENCOUNTER — Ambulatory Visit: Payer: BLUE CROSS/BLUE SHIELD | Admitting: Family Medicine

## 2017-08-15 ENCOUNTER — Ambulatory Visit: Payer: BLUE CROSS/BLUE SHIELD | Admitting: Family Medicine

## 2017-08-15 ENCOUNTER — Encounter: Payer: Self-pay | Admitting: Family Medicine

## 2017-08-25 ENCOUNTER — Ambulatory Visit: Payer: BLUE CROSS/BLUE SHIELD | Admitting: Obstetrics and Gynecology

## 2017-08-26 ENCOUNTER — Encounter: Payer: Self-pay | Admitting: Family Medicine

## 2017-08-26 ENCOUNTER — Ambulatory Visit: Payer: BLUE CROSS/BLUE SHIELD | Admitting: Family Medicine

## 2017-08-26 VITALS — BP 130/84 | Ht 64.5 in | Wt 159.2 lb

## 2017-08-26 DIAGNOSIS — F419 Anxiety disorder, unspecified: Secondary | ICD-10-CM | POA: Insufficient documentation

## 2017-08-26 DIAGNOSIS — F418 Other specified anxiety disorders: Secondary | ICD-10-CM | POA: Insufficient documentation

## 2017-08-26 MED ORDER — ALPRAZOLAM 1 MG PO TABS: ORAL_TABLET | ORAL | 5 refills | Status: DC

## 2017-08-26 NOTE — Progress Notes (Signed)
Subjective:    Patient ID: Carrie Gordon, female    DOB: [birth year], 47 y.o.   MRN: [id number]  HPI Pt here today for medication check. Patient states she takes Xanax half a tablet or whole tablet every single day rarely goes above than denies any abuse of medicine denies chest tightness pressure pain denies any significant sickness illnesses but she does state that she feels stressed building a new house facing some uncertainty with her job Review of Systems  Constitutional: Negative for activity change, fatigue and fever.  HENT: Negative for congestion.   Respiratory: Negative for cough, chest tightness and shortness of breath.   Cardiovascular: Negative for chest pain and leg swelling.  Gastrointestinal: Negative for abdominal pain.  Skin: Negative for color change.  Neurological: Negative for headaches.  Psychiatric/Behavioral: Negative for behavioral problems.       Objective:   Physical Exam  Constitutional: She appears well-developed and well-nourished. No distress.  HENT:  Head: Normocephalic and atraumatic.  Eyes: Right eye exhibits no discharge. Left eye exhibits no discharge.  Neck: No tracheal deviation present.  Cardiovascular: Normal rate, regular rhythm and normal heart sounds.  No murmur heard. Pulmonary/Chest: Effort normal and breath sounds normal. No respiratory distress. She has no wheezes. She has no rales.  Musculoskeletal: She exhibits no edema.  Lymphadenopathy:    She has no cervical adenopathy.  Neurological: She is alert. She exhibits normal muscle tone.  Skin: Skin is warm and dry. No erythema.  Psychiatric: Her behavior is normal.  Vitals reviewed.         Assessment & Plan:  Patient overall doing well Under a lot of stress Some symptoms of depression but denies being depressed Does not want any type of counseling or antidepressant Uses her Xanax as needed to help with anxiety related symptoms Does not abuse medication Refills  given Patient will follow-up in 6 months Patient states she sees her gynecologist for preventive health physical and will let us know the results

## 2017-09-01 ENCOUNTER — Other Ambulatory Visit: Payer: Self-pay

## 2017-09-01 ENCOUNTER — Ambulatory Visit: Payer: BLUE CROSS/BLUE SHIELD | Admitting: Obstetrics and Gynecology

## 2017-09-01 ENCOUNTER — Encounter: Payer: Self-pay | Admitting: Obstetrics and Gynecology

## 2017-09-01 VITALS — BP 140/82 | HR 68 | Resp 16 | Ht 64.75 in | Wt 160.0 lb

## 2017-09-01 DIAGNOSIS — F439 Reaction to severe stress, unspecified: Secondary | ICD-10-CM | POA: Diagnosis not present

## 2017-09-01 DIAGNOSIS — Z01419 Encounter for gynecological examination (general) (routine) without abnormal findings: Secondary | ICD-10-CM

## 2017-09-01 DIAGNOSIS — R7989 Other specified abnormal findings of blood chemistry: Secondary | ICD-10-CM

## 2017-09-01 MED ORDER — FLUOXETINE HCL 10 MG PO TABS: 10.0000 mg | ORAL_TABLET | Freq: Every day | ORAL | 1 refills | Status: DC

## 2017-09-01 NOTE — Progress Notes (Signed)
47 y.o. G15P1001 Married [place] American female here for annual exam.    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 Changes at work which are stressful. Feels anxious going to work.  Denies depression. Taking Xanax prior to going to work.  Not taking Trazodone regularly. Used Lexapro in the past and felt it stopped working.  Has also used Celexa for long periods of time.  Zoloft was too strong for her.  Used Wellbutrin a long time ago.   Saw her PCP for med check recently.  We will do labs here.   PCP: Dr. Sallee Lange    No LMP recorded (lmp unknown). Patient has had a hysterectomy.           Sexually active: Yes.    The current method of family planning is status post hysterectomy.    Exercising: Yes.    walking Smoker:  no  Health Maintenance: Pap:  2014 Negative History of abnormal Pap:  No MMG:  12/18/16 BIRADS 1 negative/density c Colonoscopy:  04/28/17 normal f/u 10 years BMD:   n/a  Result  n/a TDaP:  07/15/13 Gardasil:   no HIV: during pregnancy--29 yrs. Ago--negative Hep C: n/a Screening Labs: discuss today   reports that she has never smoked. She has never used smokeless tobacco. She reports that she drinks about 1.8 - 2.4 oz of alcohol per week. She reports that she does not use drugs.  Past Medical History:  Diagnosis Date  . Anxiety   . Back problem   . Bursitis of shoulder, right   . Depression   . Elevated hemoglobin A1c 2017  . Migraines   . Uterine fibroid     Past Surgical History:  Procedure Laterality Date  . ABDOMINAL HYSTERECTOMY  2010   TAH--Dr. Quincy Simmonds  . MYOMECTOMY  2006   -Dr. Quincy Simmonds  . TUBAL LIGATION  2002   -Dr. Quincy Simmonds    Current Outpatient Medications  Medication Sig Dispense Refill  . ALPRAZolam (XANAX) 1 MG tablet TAKE 1/2- 1 TABLET BY MOUTH TWICE DAILY AS NEEDED 40 tablet 5  . traZODone (DESYREL) 50 MG tablet Take 0.5-1 tablets (25-50 mg total) by mouth at bedtime as needed for sleep. 30 tablet 5   Current Facility-Administered Medications   Medication Dose Route Frequency Provider Last Rate Last Dose  . 0.9 %  sodium chloride infusion  500 mL Intravenous Once Doran Stabler, MD        Family History  Adopted: Yes  Problem Relation Age of Onset  . Breast cancer Neg Hx     Review of Systems  Constitutional: Negative.   HENT: Negative.   Eyes: Negative.   Respiratory: Negative.   Cardiovascular: Negative.   Gastrointestinal: Negative.   Endocrine: Negative.   Genitourinary: Negative.   Musculoskeletal: Negative.   Skin: Negative.   Allergic/Immunologic: Negative.   Neurological: Negative.   Hematological: Negative.   Psychiatric/Behavioral: Negative.     Exam:   BP 140/82 (BP Location: Right Arm, Patient Position: Sitting, Cuff Size: Normal)   Pulse 68   Resp 16   Ht 5' 4.75" (1.645 m)   Wt 160 lb (72.6 kg)   LMP  (LMP Unknown)   BMI 26.83 kg/m     General appearance: alert, cooperative and appears stated age Head: Normocephalic, without obvious abnormality, atraumatic Neck: no adenopathy, supple, symmetrical, trachea midline and thyroid normal to inspection and palpation Lungs: clear to auscultation bilaterally Breasts: normal appearance, no masses or tenderness, No nipple retraction or dimpling, No nipple  discharge or bleeding, No axillary or supraclavicular adenopathy Heart: regular rate and rhythm Abdomen: soft, non-tender; no masses, no organomegaly Extremities: extremities normal, atraumatic, no cyanosis or edema Skin: Skin color, texture, turgor normal. No rashes or lesions Lymph nodes: Cervical, supraclavicular, and axillary nodes normal. No abnormal inguinal nodes palpated Neurologic: Grossly normal  Pelvic: External genitalia:  no lesions              Urethra:  normal appearing urethra with no masses, tenderness or lesions              Bartholins and Skenes: normal                 Vagina: normal appearing vagina with normal color and discharge, no lesions              Cervix:  Absent.               Pap taken: No. Bimanual Exam:  Uterus:  Absent.              Adnexa: no mass, fullness, tenderness              Rectal exam: Yes.  .  Confirms.              Anus:  normal sphincter tone, no lesions  Chaperone was present for exam.  Assessment:   Well woman visit with normal exam. Status post TAH. Remote history of abnormal pap. Chronic bilateral nipple discharge.  May be due to fibrocystic change. Situational stress.  Plan: Mammogram screening. Recommended self breast awareness. Pap and HR HPV as above. Guidelines for Calcium, Vitamin D, regular exercise program including cardiovascular and weight bearing exercise. Start Prozac 10 mg daily.   Discussed side effects and serotonin syndrome.  Routine labs with cc to PCP.  Follow up annually and prn.  Fu in 6 weeks.   After visit summary provided.

## 2017-09-01 NOTE — Patient Instructions (Signed)
EXERCISE AND DIET:  We recommended that you start or continue a regular exercise program for good health. Regular exercise means any activity that makes your heart beat faster and makes you sweat.  We recommend exercising at least 30 minutes per day at least 3 days a week, preferably 4 or 5.  We also recommend a diet low in fat and sugar.  Inactivity, poor dietary choices and obesity can cause diabetes, heart attack, stroke, and kidney damage, among others.    ALCOHOL AND SMOKING:  Women should limit their alcohol intake to no more than 7 drinks/beers/glasses of wine (combined, not each!) per week. Moderation of alcohol intake to this level decreases your risk of breast cancer and liver damage. And of course, no recreational drugs are part of a healthy lifestyle.  And absolutely no smoking or even second hand smoke. Most people know smoking can cause heart and lung diseases, but did you know it also contributes to weakening of your bones? Aging of your skin?  Yellowing of your teeth and nails?  CALCIUM AND VITAMIN D:  Adequate intake of calcium and Vitamin D are recommended.  The recommendations for exact amounts of these supplements seem to change often, but generally speaking 600 mg of calcium (either carbonate or citrate) and 800 units of Vitamin D per day seems prudent. Certain women may benefit from higher intake of Vitamin D.  If you are among these women, your doctor will have told you during your visit.    PAP SMEARS:  Pap smears, to check for cervical cancer or precancers,  have traditionally been done yearly, although recent scientific advances have shown that most women can have pap smears less often.  However, every woman still should have a physical exam from her gynecologist every year. It will include a breast check, inspection of the vulva and vagina to check for abnormal growths or skin changes, a visual exam of the cervix, and then an exam to evaluate the size and shape of the uterus and  ovaries.  And after 49 years of age, a rectal exam is indicated to check for rectal cancers. We will also provide age appropriate advice regarding health maintenance, like when you should have certain vaccines, screening for sexually transmitted diseases, bone density testing, colonoscopy, mammograms, etc.   MAMMOGRAMS:  All women over 49 years old should have a yearly mammogram. Many facilities now offer a "3D" mammogram, which may cost around $50 extra out of pocket. If possible,  we recommend you accept the option to have the 3D mammogram performed.  It both reduces the number of women who will be called back for extra views which then turn out to be normal, and it is better than the routine mammogram at detecting truly abnormal areas.    COLONOSCOPY:  Colonoscopy to screen for colon cancer is recommended for all women at age 49.  We know, you hate the idea of the prep.  We agree, BUT, having colon cancer and not knowing it is worse!!  Colon cancer so often starts as a polyp that can be seen and removed at colonscopy, which can quite literally save your life!  And if your first colonoscopy is normal and you have no family history of colon cancer, most women don't have to have it again for 10 years.  Once every ten years, you can do something that may end up saving your life, right?  We will be happy to help you get it scheduled when you are ready.    Be sure to check your insurance coverage so you understand how much it will cost.  It may be covered as a preventative service at no cost, but you should check your particular policy.     Fluoxetine capsules or tablets (Depression/Mood Disorders) What is this medicine? FLUOXETINE (floo OX e teen) belongs to a class of drugs known as selective serotonin reuptake inhibitors (SSRIs). It helps to treat mood problems such as depression, obsessive compulsive disorder, and panic attacks. It can also treat certain eating disorders. This medicine may be used for other  purposes; ask your health care provider or pharmacist if you have questions. COMMON BRAND NAME(S): Prozac What should I tell my health care provider before I take this medicine? They need to know if you have any of these conditions: -bipolar disorder or a family history of bipolar disorder -bleeding disorders -glaucoma -heart disease -liver disease -low levels of sodium in the blood -seizures -suicidal thoughts, plans, or attempt; a previous suicide attempt by you or a family member -take MAOIs like Carbex, Eldepryl, Marplan, Nardil, and Parnate -take medicines that treat or prevent blood clots -thyroid disease -an unusual or allergic reaction to fluoxetine, other medicines, foods, dyes, or preservatives -pregnant or trying to get pregnant -breast-feeding How should I use this medicine? Take this medicine by mouth with a glass of water. Follow the directions on the prescription label. You can take this medicine with or without food. Take your medicine at regular intervals. Do not take it more often than directed. Do not stop taking this medicine suddenly except upon the advice of your doctor. Stopping this medicine too quickly may cause serious side effects or your condition may worsen. A special MedGuide will be given to you by the pharmacist with each prescription and refill. Be sure to read this information carefully each time. Talk to your pediatrician regarding the use of this medicine in children. While this drug may be prescribed for children as young as 7 years for selected conditions, precautions do apply. Overdosage: If you think you have taken too much of this medicine contact a poison control center or emergency room at once. NOTE: This medicine is only for you. Do not share this medicine with others. What if I miss a dose? If you miss a dose, skip the missed dose and go back to your regular dosing schedule. Do not take double or extra doses. What may interact with this  medicine? Do not take this medicine with any of the following medications: -other medicines containing fluoxetine, like Sarafem or Symbyax -cisapride -linezolid -MAOIs like Carbex, Eldepryl, Marplan, Nardil, and Parnate -methylene blue (injected into a vein) -pimozide -thioridazine This medicine may also interact with the following medications: -alcohol -amphetamines -aspirin and aspirin-like medicines -carbamazepine -certain medicines for depression, anxiety, or psychotic disturbances -certain medicines for migraine headaches like almotriptan, eletriptan, frovatriptan, naratriptan, rizatriptan, sumatriptan, zolmitriptan -digoxin -diuretics -fentanyl -flecainide -furazolidone -isoniazid -lithium -medicines for sleep -medicines that treat or prevent blood clots like warfarin, enoxaparin, and dalteparin -NSAIDs, medicines for pain and inflammation, like ibuprofen or naproxen -phenytoin -procarbazine -propafenone -rasagiline -ritonavir -supplements like St. John's wort, kava kava, valerian -tramadol -tryptophan -vinblastine This list may not describe all possible interactions. Give your health care provider a list of all the medicines, herbs, non-prescription drugs, or dietary supplements you use. Also tell them if you smoke, drink alcohol, or use illegal drugs. Some items may interact with your medicine. What should I watch for while using this medicine? Tell your doctor if your  symptoms do not get better or if they get worse. Visit your doctor or health care professional for regular checks on your progress. Because it may take several weeks to see the full effects of this medicine, it is important to continue your treatment as prescribed by your doctor. Patients and their families should watch out for new or worsening thoughts of suicide or depression. Also watch out for sudden changes in feelings such as feeling anxious, agitated, panicky, irritable, hostile, aggressive,  impulsive, severely restless, overly excited and hyperactive, or not being able to sleep. If this happens, especially at the beginning of treatment or after a change in dose, call your health care professional. Dennis Bast may get drowsy or dizzy. Do not drive, use machinery, or do anything that needs mental alertness until you know how this medicine affects you. Do not stand or sit up quickly, especially if you are an older patient. This reduces the risk of dizzy or fainting spells. Alcohol may interfere with the effect of this medicine. Avoid alcoholic drinks. Your mouth may get dry. Chewing sugarless gum or sucking hard candy, and drinking plenty of water may help. Contact your doctor if the problem does not go away or is severe. This medicine may affect blood sugar levels. If you have diabetes, check with your doctor or health care professional before you change your diet or the dose of your diabetic medicine. What side effects may I notice from receiving this medicine? Side effects that you should report to your doctor or health care professional as soon as possible: -allergic reactions like skin rash, itching or hives, swelling of the face, lips, or tongue -anxious -black, tarry stools -breathing problems -changes in vision -confusion -elevated mood, decreased need for sleep, racing thoughts, impulsive behavior -eye pain -fast, irregular heartbeat -feeling faint or lightheaded, falls -feeling agitated, angry, or irritable -hallucination, loss of contact with reality -loss of balance or coordination -loss of memory -painful or prolonged erections -restlessness, pacing, inability to keep still -seizures -stiff muscles -suicidal thoughts or other mood changes -trouble sleeping -unusual bleeding or bruising -unusually weak or tired -vomiting Side effects that usually do not require medical attention (report to your doctor or health care professional if they continue or are  bothersome): -change in appetite or weight -change in sex drive or performance -diarrhea -dry mouth -headache -increased sweating -nausea -tremors This list may not describe all possible side effects. Call your doctor for medical advice about side effects. You may report side effects to FDA at 1-800-FDA-1088. Where should I keep my medicine? Keep out of the reach of children. Store at room temperature between 15 and 30 degrees C (59 and 86 degrees F). Throw away any unused medicine after the expiration date. NOTE: This sheet is a summary. It may not cover all possible information. If you have questions about this medicine, talk to your doctor, pharmacist, or health care provider.  2018 Elsevier/Gold Standard (2015-09-30 15:55:27)

## 2017-09-02 LAB — COMPREHENSIVE METABOLIC PANEL
ALT: 17 IU/L (ref 0–32)
AST: 19 IU/L (ref 0–40)
Albumin/Globulin Ratio: 1.5 (ref 1.2–2.2)
Albumin: 4.3 g/dL (ref 3.5–5.5)
Alkaline Phosphatase: 84 IU/L (ref 39–117)
BUN/Creatinine Ratio: 8 — ABNORMAL LOW (ref 9–23)
BUN: 7 mg/dL (ref 6–24)
Bilirubin Total: 0.9 mg/dL (ref 0.0–1.2)
CO2: 26 mmol/L (ref 20–29)
Calcium: 9.3 mg/dL (ref 8.7–10.2)
Chloride: 100 mmol/L (ref 96–106)
Creatinine, Ser: 0.88 mg/dL (ref 0.57–1.00)
GFR calc Af Amer: 90 mL/min/{1.73_m2} (ref >59)
GFR calc non Af Amer: 78 mL/min/{1.73_m2} (ref >59)
Globulin, Total: 2.9 g/dL (ref 1.5–4.5)
Glucose: 93 mg/dL (ref 65–99)
Potassium: 4.2 mmol/L (ref 3.5–5.2)
Sodium: 137 mmol/L (ref 134–144)
Total Protein: 7.2 g/dL (ref 6.0–8.5)

## 2017-09-02 LAB — VITAMIN D 25 HYDROXY (VIT D DEFICIENCY, FRACTURES): Vit D, 25-Hydroxy: 17.2 ng/mL — ABNORMAL LOW (ref 30.0–100.0)

## 2017-09-02 LAB — CBC
Hematocrit: 42.5 % (ref 34.0–46.6)
Hemoglobin: 14.1 g/dL (ref 11.1–15.9)
MCH: 30.5 pg (ref 26.6–33.0)
MCHC: 33.2 g/dL (ref 31.5–35.7)
MCV: 92 fL (ref 79–97)
Platelets: 249 x10E3/uL (ref 150–379)
RBC: 4.63 x10E6/uL (ref 3.77–5.28)
RDW: 14.2 % (ref 12.3–15.4)
WBC: 6.3 x10E3/uL (ref 3.4–10.8)

## 2017-09-02 LAB — HEMOGLOBIN A1C
Est. average glucose Bld gHb Est-mCnc: 114 mg/dL
Hgb A1c MFr Bld: 5.6 % (ref 4.8–5.6)

## 2017-09-02 LAB — LIPID PANEL
Chol/HDL Ratio: 3 ratio (ref 0.0–4.4)
Cholesterol, Total: 183 mg/dL (ref 100–199)
HDL: 62 mg/dL
LDL Calculated: 110 mg/dL — ABNORMAL HIGH (ref 0–99)
Triglycerides: 57 mg/dL (ref 0–149)
VLDL Cholesterol Cal: 11 mg/dL (ref 5–40)

## 2017-09-02 LAB — TSH: TSH: 0.984 u[IU]/mL (ref 0.450–4.500)

## 2017-09-03 ENCOUNTER — Encounter: Payer: Self-pay | Admitting: Obstetrics and Gynecology

## 2017-09-03 NOTE — Addendum Note (Signed)
Addended by: Yisroel Ramming, Dietrich Pates E on: 09/03/2017 04:17 PM   Modules accepted: Orders

## 2017-10-15 ENCOUNTER — Ambulatory Visit: Payer: BLUE CROSS/BLUE SHIELD | Admitting: Obstetrics and Gynecology

## 2017-10-23 ENCOUNTER — Other Ambulatory Visit: Payer: Self-pay | Admitting: Obstetrics and Gynecology

## 2017-10-23 NOTE — Telephone Encounter (Signed)
Medication refill request: Prozac Last AEX:  09-01-17  Next AEX: 09-04-18 Last MMG (if hormonal medication request): 12-18-16 WNL  Refill authorized: please advise

## 2017-11-26 ENCOUNTER — Encounter: Payer: Self-pay | Admitting: Obstetrics and Gynecology

## 2017-11-26 ENCOUNTER — Ambulatory Visit: Payer: BLUE CROSS/BLUE SHIELD | Admitting: Obstetrics and Gynecology

## 2017-11-26 ENCOUNTER — Telehealth: Payer: Self-pay | Admitting: Obstetrics and Gynecology

## 2017-12-30 ENCOUNTER — Telehealth: Payer: Self-pay | Admitting: Obstetrics and Gynecology

## 2017-12-30 NOTE — Telephone Encounter (Signed)
Please let patient know she is overdue for her vit D recheck.  She came up in my reminder box.

## 2017-12-31 NOTE — Telephone Encounter (Signed)
Tried calling patient, no answer, left message for patient to call and scheduled Vitamin D recheck. Okay per DPR to leave a detailed message.

## 2018-01-01 NOTE — Telephone Encounter (Signed)
Patient left voicemail returning call to [facility]. Stated that she works from 9-6, and requested an 8am appointment.

## 2018-01-02 NOTE — Telephone Encounter (Signed)
Patient returned call and scheduled vit d recheck on 01/07/18. OK to close encounter?

## 2018-01-02 NOTE — Telephone Encounter (Signed)
Tried calling patient back. No answer, left message for patient to call our office. Patient is okay to speak with any office staff to schedule recheck lab visit for Vitamin D. Order for lab is in.

## 2018-01-07 ENCOUNTER — Other Ambulatory Visit (INDEPENDENT_AMBULATORY_CARE_PROVIDER_SITE_OTHER): Payer: BLUE CROSS/BLUE SHIELD

## 2018-01-07 DIAGNOSIS — E559 Vitamin D deficiency, unspecified: Secondary | ICD-10-CM | POA: Diagnosis not present

## 2018-01-07 DIAGNOSIS — R7989 Other specified abnormal findings of blood chemistry: Secondary | ICD-10-CM | POA: Diagnosis not present

## 2018-01-08 LAB — VITAMIN D 25 HYDROXY (VIT D DEFICIENCY, FRACTURES): Vit D, 25-Hydroxy: 19.9 ng/mL — ABNORMAL LOW (ref 30.0–100.0)

## 2018-01-09 ENCOUNTER — Telehealth: Payer: Self-pay | Admitting: *Deleted

## 2018-01-09 MED ORDER — VITAMIN D (ERGOCALCIFEROL) 1.25 MG (50000 UNIT) PO CAPS: 50000.0000 [IU] | ORAL_CAPSULE | ORAL | 0 refills | Status: AC

## 2018-01-09 NOTE — Telephone Encounter (Signed)
Notes recorded by Burnice Logan, RN on 01/09/2018 at 8:33 AM EDT Left message to call Sharee Pimple at [phone].

## 2018-01-09 NOTE — Telephone Encounter (Signed)
-----   Message from Nunzio Cobbs, MD sent at 01/08/2018  5:10 PM EDT ----- Please report vitamin D level to patient.  It is still low, and I am recommending taking vit D 50,000 IU every 2 weeks for 3 months.  She will need another vit D check in 3 months.

## 2018-01-09 NOTE — Telephone Encounter (Signed)
Spoke with patient, advised as seen below per Dr. Quincy Simmonds. Rx for Vitamin D to verified pharmacy. Patient read back instructions for Vit D. Lab appt scheduled for 04/16/18 at 8:30am.  Patient verbalizes understanding and is agreeable. Encounter closed.

## 2018-01-15 ENCOUNTER — Other Ambulatory Visit: Payer: BLUE CROSS/BLUE SHIELD

## 2018-02-05 ENCOUNTER — Other Ambulatory Visit: Payer: Self-pay | Admitting: Obstetrics and Gynecology

## 2018-02-05 DIAGNOSIS — Z1231 Encounter for screening mammogram for malignant neoplasm of breast: Secondary | ICD-10-CM

## 2018-03-09 ENCOUNTER — Encounter: Payer: Self-pay | Admitting: Family Medicine

## 2018-03-09 ENCOUNTER — Ambulatory Visit: Payer: BLUE CROSS/BLUE SHIELD | Admitting: Family Medicine

## 2018-03-09 VITALS — BP 130/82 | Temp 98.3°F | Ht 64.75 in | Wt 164.6 lb

## 2018-03-09 DIAGNOSIS — H65111 Acute and subacute allergic otitis media (mucoid) (sanguinous) (serous), right ear: Secondary | ICD-10-CM

## 2018-03-09 MED ORDER — AMOXICILLIN-POT CLAVULANATE 875-125 MG PO TABS: 1.0000 | ORAL_TABLET | Freq: Two times a day (BID) | ORAL | 0 refills | Status: DC

## 2018-03-09 MED ORDER — OXYCODONE-ACETAMINOPHEN 5-325 MG PO TABS: 1.0000 | ORAL_TABLET | ORAL | 0 refills | Status: AC | PRN

## 2018-03-09 NOTE — Progress Notes (Signed)
Subjective:    Patient ID: Carrie Gordon, female    DOB: [birth year], 46 y.o.   MRN: [id number]  HPI Pt here today for right inner ear pain. No discharge from ear. Began on Thursday. Pt went on [facility] live and was prescribed ear drops and an inhaler. Pt has been taking Tylenol. Pt did use sweet oil yesterday and has taking Oxycodone that she had at home. Pain is better today; sharp pains at time Recent head congestion drainage coughing now with right ear pain discomfort pain severe at nighttime denies high fever chills sweats wheezing difficulty breathing  Review of Systems  Constitutional: Negative for activity change and fever.  HENT: Positive for congestion and rhinorrhea. Negative for ear pain.   Eyes: Negative for discharge.  Respiratory: Positive for cough. Negative for shortness of breath and wheezing.   Cardiovascular: Negative for chest pain.       Objective:   Physical Exam  Constitutional: She appears well-developed.  HENT:  Head: Normocephalic.  Nose: Nose normal.  Mouth/Throat: Oropharynx is clear and moist. No oropharyngeal exudate.  Neck: Neck supple.  Cardiovascular: Normal rate and normal heart sounds.  No murmur heard. Pulmonary/Chest: Effort normal and breath sounds normal. She has no wheezes.  Lymphadenopathy:    She has no cervical adenopathy.  Skin: Skin is warm and dry.  Nursing note and vitals reviewed.  Right otitis media noted       Assessment & Plan:  Right otitis media Antibiotics prescribed Warning signs discussed Mild to moderate pain on that side related to the infection Oxycodone for severe pain per patient request small amount given Warning signs discussed follow-up if problems

## 2018-03-11 ENCOUNTER — Ambulatory Visit
Admission: RE | Admit: 2018-03-11 | Discharge: 2018-03-11 | Disposition: A | Discharge status: Home / Self Care | Payer: BLUE CROSS/BLUE SHIELD | Source: Ambulatory Visit | Attending: Obstetrics and Gynecology | Admitting: Obstetrics and Gynecology

## 2018-03-11 DIAGNOSIS — Z1231 Encounter for screening mammogram for malignant neoplasm of breast: Secondary | ICD-10-CM

## 2018-04-07 ENCOUNTER — Other Ambulatory Visit: Payer: Self-pay | Admitting: Obstetrics and Gynecology

## 2018-04-07 NOTE — Telephone Encounter (Signed)
Medication refill request: vit d 50,000iu Last AEX:  09-01-17 Next AEX: 09-04-18 Last MMG (if hormonal medication request): n/a  Refill authorized: pt was put on vit d rx 01-08-18 once weekly for 26mths. Pt is out of vitamin d & has lab appt for 04-16-18 for recheck. Should patient continue rx for 2 more weeks or just take otc vitamin d 1000iu qd until lab draw. Please advise.

## 2018-04-08 NOTE — Telephone Encounter (Signed)
Over the counter vit D 1000 IU is ok until we check her vit D level.

## 2018-04-10 ENCOUNTER — Ambulatory Visit (INDEPENDENT_AMBULATORY_CARE_PROVIDER_SITE_OTHER): Payer: BLUE CROSS/BLUE SHIELD | Admitting: *Deleted

## 2018-04-10 ENCOUNTER — Telehealth: Payer: Self-pay | Admitting: *Deleted

## 2018-04-10 DIAGNOSIS — Z23 Encounter for immunization: Secondary | ICD-10-CM

## 2018-04-10 NOTE — Telephone Encounter (Signed)
Pt came in for a flu vaccine and asked for a letter for her job so that she could have a rising desk because it hurts for her to sit all day. 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 Having trouble sitting for 8 hours and her boss told her she would need a letter from her pcp stating   Due to medical condition please accommodate my patient with an ergonomic rising desk/chair.   Letter was written on letterhead and put in dr scott office and if he agrees with he just needs to sign and give back to nurses to call pt.   Pt states when ready call her and she will get her husband to come pickup.   Call pt on [phone].

## 2018-04-12 NOTE — Telephone Encounter (Signed)
I agree with the assessment.  This patient would benefit from having an adjustable desk/chair this form was completed thank you-please forward to the family/patient

## 2018-04-13 NOTE — Telephone Encounter (Signed)
Patient is aware and will have her husband pick up.She is aware it was placed up front for her to pick up.

## 2018-04-14 NOTE — Telephone Encounter (Signed)
Left detailed message on voicemail. DPR signed. 

## 2018-04-16 ENCOUNTER — Other Ambulatory Visit: Payer: Self-pay | Admitting: *Deleted

## 2018-04-16 ENCOUNTER — Other Ambulatory Visit: Payer: BLUE CROSS/BLUE SHIELD

## 2018-04-16 DIAGNOSIS — R7989 Other specified abnormal findings of blood chemistry: Secondary | ICD-10-CM

## 2018-04-16 DIAGNOSIS — E559 Vitamin D deficiency, unspecified: Secondary | ICD-10-CM | POA: Diagnosis not present

## 2018-04-17 LAB — VITAMIN D 25 HYDROXY (VIT D DEFICIENCY, FRACTURES): Vit D, 25-Hydroxy: 32.9 ng/mL (ref 30.0–100.0)

## 2018-04-30 ENCOUNTER — Other Ambulatory Visit: Payer: Self-pay | Admitting: Family Medicine

## 2018-04-30 NOTE — Telephone Encounter (Signed)
Ok plus 2 ref 

## 2018-09-04 ENCOUNTER — Ambulatory Visit: Payer: BLUE CROSS/BLUE SHIELD | Admitting: Obstetrics and Gynecology

## 2018-09-10 ENCOUNTER — Ambulatory Visit: Payer: BLUE CROSS/BLUE SHIELD | Admitting: Obstetrics and Gynecology

## 2018-10-12 ENCOUNTER — Encounter: Payer: Self-pay | Admitting: Obstetrics and Gynecology

## 2018-10-12 ENCOUNTER — Other Ambulatory Visit: Payer: Self-pay

## 2018-10-12 ENCOUNTER — Ambulatory Visit: Payer: BLUE CROSS/BLUE SHIELD | Admitting: Obstetrics and Gynecology

## 2018-10-12 VITALS — BP 120/78 | HR 80 | Temp 97.7°F | Resp 14 | Ht 65.0 in | Wt 172.0 lb

## 2018-10-12 DIAGNOSIS — Z01419 Encounter for gynecological examination (general) (routine) without abnormal findings: Secondary | ICD-10-CM | POA: Diagnosis not present

## 2018-10-12 NOTE — Progress Notes (Signed)
48 y.o. G55P1001 Married [place] American female here for annual exam.    Waking up at night feeling hot.  Had a week of nipple tenderness.   No bladder or bowel problems.   Occasional pain under the mid portion of her incision.   Finished building the house.  Working from home.  PCP: Sallee Lange, MD    No LMP recorded (lmp unknown). Patient has had a hysterectomy.           Sexually active: Yes.    The current method of family planning is status post hysterectomy.    Exercising: Yes.    walking Smoker:  no  Health Maintenance: Pap:  2014 Negative History of abnormal Pap:  no MMG:  03/11/18 BIRADS 1 negative/density c Colonoscopy:  04/28/17 normal f/u 10 years TDaP:  2015 Gardasil:   n/a HIV: during pregnancy--29 yrs. Ago--negative Hep C: never.  NA. Screening Labs: discuss today   reports that she has never smoked. She has never used smokeless tobacco. She reports current alcohol use of about 3.0 - 4.0 standard drinks of alcohol per week. She reports that she does not use drugs.  Past Medical History:  Diagnosis Date  . Anxiety   . Back problem   . Bursitis of shoulder, right   . Depression   . Elevated hemoglobin A1c 2017  . Low vitamin D level   . Migraines   . Uterine fibroid     Past Surgical History:  Procedure Laterality Date  . ABDOMINAL HYSTERECTOMY  2010   TA[facility]--Dr. Quincy Simmonds  . MYOMECTOMY  2006   -Dr. Quincy Simmonds  . TUBAL LIGATION  2002   -Dr. Quincy Simmonds    Current Outpatient Medications  Medication Sig Dispense Refill  . ALPRAZolam (XANAX) 1 MG tablet TAKE ONE-HALF TO 1 TABLET BY MOUTH TWICE DAILY AS NEEDED 40 tablet 2   Current Facility-Administered Medications  Medication Dose Route Frequency Provider Last Rate Last Dose  . 0.9 %  sodium chloride infusion  500 mL Intravenous Once Doran Stabler, MD        Family History  Adopted: Yes  Problem Relation Age of Onset  . Breast cancer Neg Hx     Review of Systems  Constitutional: Negative.    HENT: Negative.   Eyes: Negative.   Respiratory: Negative.   Cardiovascular: Negative.   Gastrointestinal: Negative.   Endocrine: Negative.   Genitourinary: Negative.   Musculoskeletal: Negative.   Skin: Negative.   Allergic/Immunologic: Negative.   Neurological: Negative.   Hematological: Negative.   Psychiatric/Behavioral: Negative.     Exam:   BP 120/78 (BP Location: Right Arm, Patient Position: Sitting, Cuff Size: Normal)   Pulse 80   Temp 97.7 F (36.5 C) (Temporal)   Resp 14   Ht 5\' 5"  (1.651 m)   Wt 172 lb (78 kg)   LMP  (LMP Unknown)   BMI 28.62 kg/m     General appearance: alert, cooperative and appears stated age Head: normocephalic, without obvious abnormality, atraumatic Neck: no adenopathy, supple, symmetrical, trachea midline and thyroid normal to inspection and palpation Lungs: clear to auscultation bilaterally Breasts: normal appearance, no masses or tenderness, No nipple retraction or dimpling, No nipple discharge or bleeding, No axillary adenopathy Heart: regular rate and rhythm Abdomen: soft, non-tender; no masses, no organomegaly Extremities: extremities normal, atraumatic, no cyanosis or edema Skin: skin color, texture, turgor normal. No rashes or lesions Lymph nodes: cervical, supraclavicular, and axillary nodes normal. Neurologic: grossly normal  Pelvic: External genitalia:  no lesions              No abnormal inguinal nodes palpated.              Urethra:  normal appearing urethra with no masses, tenderness or lesions              Bartholins and Skenes: normal                 Vagina: normal appearing vagina with normal color and discharge, no lesions              Cervix:  absent              Pap taken: No. Bimanual Exam:  Uterus:  Absent.               Adnexa: no mass, fullness, tenderness              Rectal exam: Yes.  .  Confirms.              Anus:  normal sphincter tone, no lesions  Chaperone was present for exam.  Assessment:   Well  woman visit with normal exam. Status post TAH. Remote history of abnormal pap. Hx low vit D.   Plan: Mammogram screening discussed. Self breast awareness reviewed. Pap and HR HPV as above. Guidelines for Calcium, Vitamin D, regular exercise program including cardiovascular and weight bearing exercise. Routine labs. Follow up annually and prn.   After visit summary provided.

## 2018-10-12 NOTE — Patient Instructions (Signed)

## 2018-10-13 LAB — COMPREHENSIVE METABOLIC PANEL
ALT: 16 IU/L (ref 0–32)
AST: 23 IU/L (ref 0–40)
Albumin/Globulin Ratio: 1.3 (ref 1.2–2.2)
Albumin: 4 g/dL (ref 3.8–4.8)
Alkaline Phosphatase: 86 IU/L (ref 39–117)
BUN/Creatinine Ratio: 11 (ref 9–23)
BUN: 10 mg/dL (ref 6–24)
Bilirubin Total: 0.8 mg/dL (ref 0.0–1.2)
CO2: 25 mmol/L (ref 20–29)
Calcium: 9 mg/dL (ref 8.7–10.2)
Chloride: 102 mmol/L (ref 96–106)
Creatinine, Ser: 0.93 mg/dL (ref 0.57–1.00)
GFR calc Af Amer: 83 mL/min/{1.73_m2} (ref >59)
GFR calc non Af Amer: 72 mL/min/{1.73_m2} (ref >59)
Globulin, Total: 3 g/dL (ref 1.5–4.5)
Glucose: 120 mg/dL — ABNORMAL HIGH (ref 65–99)
Potassium: 3.5 mmol/L (ref 3.5–5.2)
Sodium: 142 mmol/L (ref 134–144)
Total Protein: 7 g/dL (ref 6.0–8.5)

## 2018-10-13 LAB — LIPID PANEL
Chol/HDL Ratio: 3.1 ratio (ref 0.0–4.4)
Cholesterol, Total: 178 mg/dL (ref 100–199)
HDL: 57 mg/dL (ref >39)
LDL Calculated: 107 mg/dL — ABNORMAL HIGH (ref 0–99)
Triglycerides: 71 mg/dL (ref 0–149)
VLDL Cholesterol Cal: 14 mg/dL (ref 5–40)

## 2018-10-13 LAB — CBC
Hematocrit: 40.6 % (ref 34.0–46.6)
Hemoglobin: 13.5 g/dL (ref 11.1–15.9)
MCH: 30.5 pg (ref 26.6–33.0)
MCHC: 33.3 g/dL (ref 31.5–35.7)
MCV: 92 fL (ref 79–97)
Platelets: 231 10*3/uL (ref 150–450)
RBC: 4.43 x10E6/uL (ref 3.77–5.28)
RDW: 13.5 % (ref 11.7–15.4)
WBC: 7.3 10*3/uL (ref 3.4–10.8)

## 2018-10-13 LAB — VITAMIN D 25 HYDROXY (VIT D DEFICIENCY, FRACTURES): Vit D, 25-Hydroxy: 17.3 ng/mL — ABNORMAL LOW (ref 30.0–100.0)

## 2018-10-13 LAB — HEMOGLOBIN A1C
Est. average glucose Bld gHb Est-mCnc: 111 mg/dL
Hgb A1c MFr Bld: 5.5 % (ref 4.8–5.6)

## 2018-10-14 MED ORDER — VITAMIN D (ERGOCALCIFEROL) 1.25 MG (50000 UNIT) PO CAPS: 50000.0000 [IU] | ORAL_CAPSULE | ORAL | 3 refills | Status: DC

## 2018-10-14 NOTE — Addendum Note (Signed)
Addended by: Yisroel Ramming, BROOK E on: 10/14/2018 11:28 AM   Modules accepted: Orders

## 2018-10-20 ENCOUNTER — Telehealth: Payer: Self-pay | Admitting: Obstetrics and Gynecology

## 2018-10-20 NOTE — Telephone Encounter (Signed)
Patient stated that she went to pick up her Vitamin D prescription and there were only 2 tablets. Patient stated that she isn't sure if that was intentional or not. Patient also stated that she has no energy.

## 2018-10-20 NOTE — Telephone Encounter (Signed)
Patient returning call. Can leave voicemail.

## 2018-10-20 NOTE — Telephone Encounter (Signed)
Call returned to patient. Left detailed message per patients request. Advised RX sent on 10/14/18 for Vit D 50,000 IU PO q14 days #6/3RF. Advised 2 caps would be a 30 day supply, f/u with pharmacy regarding options for filling 30 day vs 90 day, this is sometimes determined by your insurance. Fatigue can be related to low vitamin D. Advised if this is a new symptom or if any new symptoms have developed she should return call to office to further discuss or contact PCP. Return call to office if any additional questions/concerns.   Routing to provider for final review. Patient is agreeable to disposition. Will close encounter.

## 2018-10-20 NOTE — Telephone Encounter (Signed)
Patient returned call, advised as seen below. Patient states she feels tired all the time, is working from home and sleeping more. Mood is "normal", denies any GYN symptoms, pain, bleeding, fever/chills, upper respiratory symptoms. Advised patient fatigue can be symptom of vit D deficiency. Reports she started Vit D last week. Advised to continue to monitor since starting Vit D, if any new symptoms develop or symptoms worsen she should return call for OV. Patient states she just wanted to update Dr. Quincy Simmonds. Advised I will update Dr. Quincy Simmonds and return call if any additional recommendations. Patient agreeable.  Routing to provider for final review. Patient is agreeable to disposition. Will close encounter.

## 2018-10-20 NOTE — Telephone Encounter (Signed)
Left message to call Taraoluwa Thakur, RN at [facility] [phone].   

## 2018-10-20 NOTE — Telephone Encounter (Signed)
I would recommend patient have a follow up visit with me and for additional lab work.  I would like to test her thyroid and her hormones to see if she is menopausal now.  These were not done at her annual exam.  The vitamin D deficiency can cause some fatigue, but there may be other reasons for this.

## 2018-10-21 NOTE — Telephone Encounter (Signed)
Left message to call Naftuli Dalsanto, RN at [facility] [phone].   

## 2018-10-21 NOTE — Telephone Encounter (Signed)
Spoke with patient, advised as seen below per Dr. Quincy Simmonds. OV scheduled for 6/22 at 4pm with Dr. Quincy Simmonds. Patient declined earlier appointments due to her work schedule.   Routing to provider for final review. Patient is agreeable to disposition. Will close encounter.

## 2018-10-22 ENCOUNTER — Encounter

## 2018-10-29 ENCOUNTER — Other Ambulatory Visit: Payer: Self-pay

## 2018-11-02 ENCOUNTER — Ambulatory Visit: Payer: BC Managed Care – PPO | Admitting: Obstetrics and Gynecology

## 2018-11-02 ENCOUNTER — Encounter: Payer: Self-pay | Admitting: Obstetrics and Gynecology

## 2018-11-02 ENCOUNTER — Other Ambulatory Visit: Payer: Self-pay

## 2018-11-02 VITALS — BP 118/70 | HR 76 | Temp 98.4°F | Resp 12 | Wt 171.0 lb

## 2018-11-02 DIAGNOSIS — R5383 Other fatigue: Secondary | ICD-10-CM

## 2018-11-02 DIAGNOSIS — N951 Menopausal and female climacteric states: Secondary | ICD-10-CM | POA: Diagnosis not present

## 2018-11-02 NOTE — Progress Notes (Signed)
GYNECOLOGY  VISIT   HPI: 48 y.o.   Married  [place] American  female   G1P1001 with No LMP recorded (lmp unknown). Patient has had a hysterectomy.   here for f/u for fatigue; patient states that she feels "drained"    Symptoms for one month.  No energy to do exercise.  Sleep is ok.  Wakes up tired.  If she takes Xanax to help her sleep, she sleeps hard.   No regular caffeine use.  May have a glass of wine after dinner.  No change in pattern.   No hot flashes.  Does feel hot at night.   Hx low vit D.   Working from home since the end of March.  Likes it.  Eating more snack food.   No real stress.   She did take Zoloft in the past and she felt spacey.  Used Lexapro in the past as well.   GYNECOLOGIC HISTORY: No LMP recorded (lmp unknown). Patient has had a hysterectomy. Contraception:  Hysterectomy Menopausal hormone therapy:  none Last mammogram:  03/11/18 BIRADS 1 negative/density c Last pap smear:   2014 Negative        OB History    Gravida  1   Para  1   Term  1   Preterm      AB      Living  1     SAB      TAB      Ectopic      Multiple      Live Births                 Patient Active Problem List   Diagnosis Date Noted  . Anxiety 08/26/2017  . Insomnia 12/19/2016  . Migraines 12/06/2014    Past Medical History:  Diagnosis Date  . Anxiety   . Back problem   . Bursitis of shoulder, right   . Depression   . Elevated hemoglobin A1c 2017  . Low vitamin D level   . Migraines   . Uterine fibroid     Past Surgical History:  Procedure Laterality Date  . ABDOMINAL HYSTERECTOMY  2010   TAH--Dr. Quincy Simmonds  . MYOMECTOMY  2006   -Dr. Quincy Simmonds  . TUBAL LIGATION  2002   -Dr. Quincy Simmonds    Current Outpatient Medications  Medication Sig Dispense Refill  . ALPRAZolam (XANAX) 1 MG tablet TAKE ONE-HALF TO 1 TABLET BY MOUTH TWICE DAILY AS NEEDED 40 tablet 2  . Vitamin D, Ergocalciferol, (DRISDOL) 1.25 MG (50000 UT) CAPS capsule Take 1 capsule  (50,000 Units total) by mouth every 14 (fourteen) days. 6 capsule 3   Current Facility-Administered Medications  Medication Dose Route Frequency Provider Last Rate Last Dose  . 0.9 %  sodium chloride infusion  500 mL Intravenous Once Doran Stabler, MD         ALLERGIES: Patient has no known allergies.  Family History  Adopted: Yes  Problem Relation Age of Onset  . Breast cancer Neg Hx     Social History   Socioeconomic History  . Marital status: Married    Spouse name: Not on file  . Number of children: 1  . Years of education: Not on file  . Highest education level: Not on file  Occupational History  . Not on file  Social Needs  . Financial resource strain: Not on file  . Food insecurity    Worry: Not on file    Inability: Not on file  .  Transportation needs    Medical: Not on file    Non-medical: Not on file  Tobacco Use  . Smoking status: Never Smoker  . Smokeless tobacco: Never Used  Substance and Sexual Activity  . Alcohol use: Yes    Alcohol/week: 3.0 - 4.0 standard drinks    Types: 3 - 4 Standard drinks or equivalent per week    Comment: 3-4 glasses of wine/week  . Drug use: No  . Sexual activity: Yes    Partners: Male    Birth control/protection: Surgical    Comment: TAH  Lifestyle  . Physical activity    Days per week: Not on file    Minutes per session: Not on file  . Stress: Not on file  Relationships  . Social Herbalist on phone: Not on file    Gets together: Not on file    Attends religious service: Not on file    Active member of club or organization: Not on file    Attends meetings of clubs or organizations: Not on file    Relationship status: Not on file  . Intimate partner violence    Fear of current or ex partner: Not on file    Emotionally abused: Not on file    Physically abused: Not on file    Forced sexual activity: Not on file  Other Topics Concern  . Not on file  Social History Narrative  . Not on file     Review of Systems  Constitutional: Negative.   HENT: Negative.   Eyes: Negative.   Respiratory: Negative.   Cardiovascular: Negative.   Gastrointestinal: Negative.   Endocrine: Negative.   Genitourinary: Negative.   Musculoskeletal: Negative.   Skin: Negative.   Allergic/Immunologic: Negative.   Neurological: Negative.   Hematological: Negative.   Psychiatric/Behavioral: Negative.     PHYSICAL EXAMINATION:    BP 118/70 (BP Location: Left Arm, Patient Position: Sitting, Cuff Size: Large)   Pulse 76   Temp 98.4 F (36.9 C) (Temporal)   Resp 12   Wt 171 lb (77.6 kg)   LMP  (LMP Unknown)   BMI 28.46 kg/m     General appearance: alert, cooperative and appears stated age.  ASSESSMENT  Fatigue.  Hx insomnia.  Low vit D.  Menopausal symptoms.   PLAN  Check FSH,  E2, iron, ferritin, vit B12.  Do regular exercise daily.  Avoid caffeine.  Do not mix Xanax and alcohol. Use Xanax sparingly.  Avoid ETOH right before bed.  May need referral to neurology and sleep study if symptoms persist.  We discussed options for tx of menopausal symptoms if labs support this - ERT, SSRIs, SNRIs, Gabapentin.  Continue vit D replacement.  FU prn.    An After Visit Summary was printed and given to the patient.  __25____ minutes face to face time of which over 50% was spent in counseling.

## 2018-11-03 LAB — VITAMIN B12: Vitamin B-12: 623 pg/mL (ref 232–1245)

## 2018-11-03 LAB — IRON: Iron: 70 ug/dL (ref 27–159)

## 2018-11-03 LAB — FOLLICLE STIMULATING HORMONE: FSH: 6.3 m[IU]/mL

## 2018-11-03 LAB — FERRITIN: Ferritin: 200 ng/mL — ABNORMAL HIGH (ref 15–150)

## 2018-11-03 LAB — ESTRADIOL: Estradiol: 35.2 pg/mL

## 2018-11-05 ENCOUNTER — Telehealth: Payer: Self-pay | Admitting: *Deleted

## 2018-11-05 ENCOUNTER — Encounter: Payer: Self-pay | Admitting: Family Medicine

## 2018-11-05 DIAGNOSIS — R7989 Other specified abnormal findings of blood chemistry: Secondary | ICD-10-CM

## 2018-11-05 NOTE — Telephone Encounter (Signed)
-----   Message from Nunzio Cobbs, MD sent at 11/04/2018  5:10 PM EDT ----- Please contact patient with results of testing.  She is not menopausal.  Her iron levels are actually high and not low.  Her vitamin B12 is normal.   I would like to have the patient see her PCP regarding her high iron levels and her fatigue.  If she is taking an iron supplement, she can stop this for now.  I would like to have her complete her course of vitamin D treatment and then have this rechecked.

## 2018-11-05 NOTE — Telephone Encounter (Signed)
Notes recorded by Burnice Logan, RN on 11/05/2018 at 9:08 AM EDT  Left message to call Sharee Pimple, RN at [place].

## 2018-11-05 NOTE — Telephone Encounter (Signed)
Spoke with patient, advised as seen below per Dr. Quincy Simmonds. Patient will contact her PCP directly to schedule f/u. Patient declines to schedule Vit D lab at this time, she is on vacation, will return call at later date to schedule. Future lab order placed. Patient verbalizes understanding and is agreeable.   Encounter closed.

## 2018-11-05 NOTE — Telephone Encounter (Signed)
Patient returned call

## 2018-11-06 ENCOUNTER — Encounter: Payer: Self-pay | Admitting: Obstetrics and Gynecology

## 2018-11-06 ENCOUNTER — Telehealth: Payer: Self-pay | Admitting: Obstetrics and Gynecology

## 2018-11-06 NOTE — Telephone Encounter (Signed)
Spoke with patient. Advised we don't normally do type/screen on routine labs. I didn't see in Epic. Advised patient this is usually done during pregnancy and major surgeries. She could have had blood typed in 2007 when hysterectomy done. She will contact [facility] to check. She thanked me for calling her.

## 2018-11-06 NOTE — Telephone Encounter (Signed)
Left message for patient to return call to [facility].

## 2018-11-06 NOTE — Telephone Encounter (Signed)
Patient sent the following message through [facility]. Routing to triage to assist patient with request.   Good morning, I would like to know my blood type.    Thank you,  Carrie Gordon

## 2018-11-19 ENCOUNTER — Ambulatory Visit: Payer: BC Managed Care – PPO | Admitting: Family Medicine

## 2018-12-04 ENCOUNTER — Encounter: Payer: Self-pay | Admitting: Family Medicine

## 2018-12-04 ENCOUNTER — Other Ambulatory Visit: Payer: Self-pay

## 2018-12-04 ENCOUNTER — Ambulatory Visit (INDEPENDENT_AMBULATORY_CARE_PROVIDER_SITE_OTHER): Payer: BC Managed Care – PPO | Admitting: Family Medicine

## 2018-12-04 DIAGNOSIS — R5383 Other fatigue: Secondary | ICD-10-CM

## 2018-12-04 DIAGNOSIS — G479 Sleep disorder, unspecified: Secondary | ICD-10-CM

## 2018-12-04 DIAGNOSIS — R7989 Other specified abnormal findings of blood chemistry: Secondary | ICD-10-CM | POA: Diagnosis not present

## 2018-12-04 NOTE — Progress Notes (Signed)
Subjective:    Patient ID: Carrie Gordon, female    DOB: 06-Nov-1970, 48 y.o.   MRN: [id number]  HPI Patient has had fatigue recently She states she is able to sleep with her Xanax but states she wakes up in the morning feeling very fatigued and tired Throughout the day she is tired and at times sleepy She does not fall asleep with driving She does not snore Does not have high blood pressure or obesity.  Patient also states she saw her gynecologist who did numerous lab tests which revealed her ferritin level elevated She does states she drinks a glass of wine each evening does not take any iron supplements Patient calls to discuss results of blood work thru [employer]. Patient states she was feeling tired and the GYN did blood work and told her her ferritin level was high  Virtual Visit via Video Note  I connected with Carrie Gordon on 12/04/18 at  8:30 AM EDT by a video enabled telemedicine application and verified that I am speaking with the correct person using two identifiers.  Location: Patient: home Provider: office   I discussed the limitations of evaluation and management by telemedicine and the availability of in person appointments. The patient expressed understanding and agreed to proceed.  History of Present Illness:    Observations/Objective:   Assessment and Plan:   Follow Up Instructions:    I discussed the assessment and treatment plan with the patient. The patient was provided an opportunity to ask questions and all were answered. The patient agreed with the plan and demonstrated an understanding of the instructions.   The patient was advised to call back or seek an in-person evaluation if the symptoms worsen or if the condition fails to improve as anticipated.  I provided 16 minutes of non-face-to-face time during this encounter.     Review of Systems  Constitutional: Positive for fatigue. Negative for activity change, appetite change  and fever.  HENT: Negative for congestion and rhinorrhea.   Respiratory: Negative for cough and shortness of breath.   Cardiovascular: Negative for chest pain and leg swelling.  Gastrointestinal: Negative for abdominal pain, nausea and vomiting.  Skin: Negative for color change.  Neurological: Negative for dizziness and weakness.  Psychiatric/Behavioral: Negative for agitation and confusion.       Objective:   Physical Exam  Patient had virtual visit Appears to be in no distress Atraumatic Neuro able to relate and oriented No apparent resp distress Color normal       Assessment & Plan:  Elevated ferritin We will need to follow this up The patient will limit a drinking a glass of wine every night She will repeat her test in approximately 4 to 6 weeks  Significant fatigue tiredness I do not believe that this is related to the ferritin I believe the patient would benefit from having a sleep evaluation she may have a sleep sample deficit or possibly even sleep apnea recommend referral to sleep lab

## 2018-12-08 NOTE — Telephone Encounter (Signed)
Pt calling to check if Dr. Nicki Reaper has seen her mychart message

## 2019-01-12 ENCOUNTER — Other Ambulatory Visit: Payer: Self-pay

## 2019-01-12 DIAGNOSIS — R6889 Other general symptoms and signs: Secondary | ICD-10-CM | POA: Diagnosis not present

## 2019-01-12 DIAGNOSIS — Z20822 Contact with and (suspected) exposure to covid-19: Secondary | ICD-10-CM

## 2019-01-13 LAB — NOVEL CORONAVIRUS, NAA: SARS-CoV-2, NAA: NOT DETECTED

## 2019-02-22 ENCOUNTER — Other Ambulatory Visit: Payer: Self-pay | Admitting: Obstetrics and Gynecology

## 2019-02-22 DIAGNOSIS — Z1231 Encounter for screening mammogram for malignant neoplasm of breast: Secondary | ICD-10-CM

## 2019-03-18 ENCOUNTER — Other Ambulatory Visit: Payer: BC Managed Care – PPO

## 2019-03-19 ENCOUNTER — Other Ambulatory Visit: Payer: Self-pay

## 2019-03-20 ENCOUNTER — Other Ambulatory Visit (INDEPENDENT_AMBULATORY_CARE_PROVIDER_SITE_OTHER): Payer: BC Managed Care – PPO

## 2019-03-20 DIAGNOSIS — Z23 Encounter for immunization: Secondary | ICD-10-CM

## 2019-03-23 DIAGNOSIS — R7989 Other specified abnormal findings of blood chemistry: Secondary | ICD-10-CM | POA: Diagnosis not present

## 2019-03-23 DIAGNOSIS — R5383 Other fatigue: Secondary | ICD-10-CM | POA: Diagnosis not present

## 2019-03-24 LAB — IRON: Iron: 61 ug/dL (ref 27–159)

## 2019-03-24 LAB — TSH: TSH: 1.14 u[IU]/mL (ref 0.450–4.500)

## 2019-03-24 LAB — FERRITIN: Ferritin: 180 ng/mL — ABNORMAL HIGH (ref 15–150)

## 2019-04-01 NOTE — Addendum Note (Signed)
Addended by: Dairl Ponder on: 04/01/2019 09:02 AM   Modules accepted: Orders

## 2019-04-02 ENCOUNTER — Encounter: Payer: Self-pay | Admitting: Family Medicine

## 2019-04-07 ENCOUNTER — Ambulatory Visit
Admission: RE | Admit: 2019-04-07 | Discharge: 2019-04-07 | Disposition: A | Discharge status: Home / Self Care | Payer: BLUE CROSS/BLUE SHIELD | Source: Ambulatory Visit | Attending: Obstetrics and Gynecology | Admitting: Obstetrics and Gynecology

## 2019-04-07 ENCOUNTER — Other Ambulatory Visit: Payer: Self-pay

## 2019-04-07 DIAGNOSIS — Z1231 Encounter for screening mammogram for malignant neoplasm of breast: Secondary | ICD-10-CM | POA: Diagnosis not present

## 2019-04-22 ENCOUNTER — Ambulatory Visit: Payer: Self-pay | Admitting: Gastroenterology

## 2019-05-05 ENCOUNTER — Telehealth: Payer: Self-pay | Admitting: Obstetrics and Gynecology

## 2019-05-05 DIAGNOSIS — R7989 Other specified abnormal findings of blood chemistry: Secondary | ICD-10-CM

## 2019-05-05 NOTE — Telephone Encounter (Signed)
Please let patient know that she is due to have her vitamin D level rechecked.   She came up in my reminder box in Epic.

## 2019-05-05 NOTE — Telephone Encounter (Signed)
Spoke to pt. Pt notified of needing follow up on Vit D Level. Pt scheduled lab appt on 05/12/19 at 845. Orders placed.  Will route to Dr Quincy Simmonds for review and encounter closed.

## 2019-05-12 ENCOUNTER — Other Ambulatory Visit (INDEPENDENT_AMBULATORY_CARE_PROVIDER_SITE_OTHER): Payer: BC Managed Care – PPO

## 2019-05-12 ENCOUNTER — Other Ambulatory Visit: Payer: Self-pay

## 2019-05-12 DIAGNOSIS — R7989 Other specified abnormal findings of blood chemistry: Secondary | ICD-10-CM | POA: Diagnosis not present

## 2019-05-13 LAB — VITAMIN D 25 HYDROXY (VIT D DEFICIENCY, FRACTURES): Vit D, 25-Hydroxy: 27.8 ng/mL — ABNORMAL LOW (ref 30.0–100.0)

## 2019-05-17 ENCOUNTER — Telehealth: Payer: Self-pay | Admitting: *Deleted

## 2019-05-17 MED ORDER — VITAMIN D (ERGOCALCIFEROL) 1.25 MG (50000 UNIT) PO CAPS: 50000.0000 [IU] | ORAL_CAPSULE | ORAL | 0 refills | Status: DC

## 2019-05-17 NOTE — Telephone Encounter (Signed)
Left message to call Dannette Kinkaid, RN at [facility] [phone].   

## 2019-05-17 NOTE — Telephone Encounter (Signed)
Patient is returning a call to Jill. °

## 2019-05-17 NOTE — Telephone Encounter (Signed)
Spoke with patient, advised as seen below per Dr. Quincy Simmonds. Nex Vit D Rx to verified pharmacy. Lab appt scheduled for 08/24/19 at 8:45am. Patient verbalizes understanding and is agreeable.   Encounter closed.

## 2019-05-17 NOTE — Telephone Encounter (Signed)
Burnice Logan, RN  05/17/2019 11:42 AM EST    Left message to call Sharee Pimple, RN at [place].

## 2019-05-17 NOTE — Telephone Encounter (Signed)
-----   Message from Nunzio Cobbs, MD sent at 05/16/2019  6:30 PM EST ----- Please contact patient in follow up to her vitamin D level which is improved but still low.  She has been on vit D 50,000 IU every other week, and her level is still under 30.  I recommend she take the vit D 50,000 IU weekly for 3 months and then recheck again.  I will place a future vit D order.

## 2019-06-03 ENCOUNTER — Other Ambulatory Visit: Payer: Self-pay

## 2019-06-03 ENCOUNTER — Other Ambulatory Visit (INDEPENDENT_AMBULATORY_CARE_PROVIDER_SITE_OTHER): Payer: 59

## 2019-06-03 ENCOUNTER — Encounter: Payer: Self-pay | Admitting: Gastroenterology

## 2019-06-03 ENCOUNTER — Ambulatory Visit: Payer: 59 | Admitting: Gastroenterology

## 2019-06-03 VITALS — BP 138/84 | HR 70 | Temp 97.9°F | Ht 65.0 in | Wt 168.0 lb

## 2019-06-03 DIAGNOSIS — R7989 Other specified abnormal findings of blood chemistry: Secondary | ICD-10-CM | POA: Diagnosis not present

## 2019-06-03 LAB — CBC WITH DIFFERENTIAL/PLATELET
Basophils Absolute: 0 10*3/uL (ref 0.0–0.1)
Basophils Relative: 0.7 % (ref 0.0–3.0)
Eosinophils Absolute: 0.3 10*3/uL (ref 0.0–0.7)
Eosinophils Relative: 3.8 % (ref 0.0–5.0)
HCT: 42.3 % (ref 36.0–46.0)
Hemoglobin: 14.3 g/dL (ref 12.0–15.0)
Lymphocytes Relative: 27.1 % (ref 12.0–46.0)
Lymphs Abs: 1.9 10*3/uL (ref 0.7–4.0)
MCHC: 33.9 g/dL (ref 30.0–36.0)
MCV: 91.2 fl (ref 78.0–100.0)
Monocytes Absolute: 0.5 10*3/uL (ref 0.1–1.0)
Monocytes Relative: 6.6 % (ref 3.0–12.0)
Neutro Abs: 4.3 10*3/uL (ref 1.4–7.7)
Neutrophils Relative %: 61.8 % (ref 43.0–77.0)
Platelets: 217 10*3/uL (ref 150.0–400.0)
RBC: 4.64 Mil/uL (ref 3.87–5.11)
RDW: 13.8 % (ref 11.5–15.5)
WBC: 7 10*3/uL (ref 4.0–10.5)

## 2019-06-03 LAB — FERRITIN: Ferritin: 125.4 ng/mL (ref 10.0–291.0)

## 2019-06-03 LAB — HEPATIC FUNCTION PANEL
ALT: 17 U/L (ref 0–35)
AST: 20 U/L (ref 0–37)
Albumin: 4.2 g/dL (ref 3.5–5.2)
Alkaline Phosphatase: 88 U/L (ref 39–117)
Bilirubin, Direct: 0.2 mg/dL (ref 0.0–0.3)
Total Bilirubin: 0.9 mg/dL (ref 0.2–1.2)
Total Protein: 7.7 g/dL (ref 6.0–8.3)

## 2019-06-03 LAB — IBC PANEL
Iron: 89 ug/dL (ref 42–145)
Saturation Ratios: 28.5 % (ref 20.0–50.0)
Transferrin: 223 mg/dL (ref 212.0–360.0)

## 2019-06-03 NOTE — Progress Notes (Signed)
[facility] Consult Note:  History: Carrie Gordon 06/03/2019  Referring provider: Kathyrn Drown, MD  Reason for consult/chief complaint:   Subjective  HPI:  Carrie Gordon is known to me from a normal screening colonoscopy in December 2018.  She complained of fatigue, iron studies were done by gynecology or primary care, noted to have normal iron and ferritin elevated 200 (150 upper limit of normal at our lab).  She was told to avoid iron-containing foods and decrease alcohol intake.  She only has occasional alcohol use to begin with.  Repeat iron levels again showed normal iron, ferritin 180.  Hemoglobin was normal when last checked in June 2020.  She has no abdominal pain, nausea vomiting altered bowel habits rectal bleeding or weight loss.  She is adopted, thus knows very little about her family history.  ROS:  Review of Systems Denies chest pain, dyspnea or dysuria Fatigue Remainder of systems negative except as above Past Medical History: Past Medical History:  Diagnosis Date  . Anxiety   . Back problem   . Bursitis of shoulder, right   . Depression   . Elevated hemoglobin A1c 2017  . Low vitamin D level   . Migraines   . Uterine fibroid      Past Surgical History: Past Surgical History:  Procedure Laterality Date  . ABDOMINAL HYSTERECTOMY  2010   TAH--Dr. Quincy Simmonds  . MYOMECTOMY  2006   -Dr. Quincy Simmonds  . TUBAL LIGATION  2002   -Dr. Quincy Simmonds     Family History: Family History  Adopted: Yes  Problem Relation Age of Onset  . Breast cancer Neg Hx     Social History: Social History   Socioeconomic History  . Marital status: Married    Spouse name: Not on file  . Number of children: 1  . Years of education: Not on file  . Highest education level: Not on file  Occupational History  . Not on file  Tobacco Use  . Smoking status: Never Smoker  . Smokeless tobacco: Never Used  Substance and Sexual Activity  . Alcohol use: Yes   Alcohol/week: 3.0 - 4.0 standard drinks    Types: 3 - 4 Standard drinks or equivalent per week    Comment: 3-4 glasses of wine/week  . Drug use: No  . Sexual activity: Yes    Partners: Male    Birth control/protection: Surgical    Comment: TAH  Other Topics Concern  . Not on file  Social History Narrative  . Not on file   Social Determinants of Health   Financial Resource Strain:   . Difficulty of Paying Living Expenses: Not on file  Food Insecurity:   . Worried About Charity fundraiser in the Last Year: Not on file  . Ran Out of Food in the Last Year: Not on file  Transportation Needs:   . Lack of Transportation (Medical): Not on file  . Lack of Transportation (Non-Medical): Not on file  Physical Activity:   . Days of Exercise per Week: Not on file  . Minutes of Exercise per Session: Not on file  Stress:   . Feeling of Stress : Not on file  Social Connections:   . Frequency of Communication with Friends and Family: Not on file  . Frequency of Social Gatherings with Friends and Family: Not on file  . Attends Religious Services: Not on file  . Active Member of Clubs or Organizations: Not on file  . Attends Archivist  Meetings: Not on file  . Marital Status: Not on file    Allergies: No Known Allergies  Outpatient Meds: Current Outpatient Medications  Medication Sig Dispense Refill  . ALPRAZolam (XANAX) 1 MG tablet TAKE ONE-HALF TO 1 TABLET BY MOUTH TWICE DAILY AS NEEDED 40 tablet 2  . Vitamin D, Ergocalciferol, (DRISDOL) 1.25 MG (50000 UT) CAPS capsule Take 1 capsule (50,000 Units total) by mouth every 7 (seven) days. 12 capsule 0   Current Facility-Administered Medications  Medication Dose Route Frequency Provider Last Rate Last Admin  . 0.9 %  sodium chloride infusion  500 mL Intravenous Once Doran Stabler, MD          ___________________________________________________________________ Objective   Exam:  BP 138/84   Pulse 70   Temp 97.9 F  (36.6 C)   Ht 5\' 5"  (1.651 m)   Wt 168 lb (76.2 kg)   LMP  (LMP Unknown)   BMI 27.96 kg/m  Her husband was present by phone for the entire encounter  General: Well-appearing  Eyes: sclera anicteric, no redness  ENT: oral mucosa moist without lesions, no cervical or supraclavicular lymphadenopathy  CV: RRR without murmur, S1/S2, no JVD, no peripheral edema  Resp: clear to auscultation bilaterally, normal RR and effort noted  GI: soft, no tenderness, with active bowel sounds. No guarding or palpable organomegaly noted.  Skin; warm and dry, no rash or jaundice noted  Labs:  CBC Latest Ref Rng & Units 10/12/2018 09/01/2017 08/22/2016  WBC 3.4 - 10.8 x10E3/uL 7.3 6.3 8.4  Hemoglobin 11.1 - 15.9 g/dL 13.5 14.1 13.4  Hematocrit 34.0 - 46.6 % 40.6 42.5 39.6  Platelets 150 - 450 x10E3/uL 231 249 224   Iron/TIBC/Ferritin/ %Sat    Component Value Date/Time   IRON 61 03/23/2019 1011   FERRITIN 180 (H) 03/23/2019 1011   Similar iron levels in June 2020  CMP Latest Ref Rng & Units 10/12/2018 09/01/2017 08/22/2016  Glucose 65 - 99 mg/dL 120(H) 93 83  BUN 6 - 24 mg/dL 10 7 10   Creatinine 0.57 - 1.00 mg/dL 0.93 0.88 0.82  Sodium 134 - 144 mmol/L 142 137 136  Potassium 3.5 - 5.2 mmol/L 3.5 4.2 3.6  Chloride 96 - 106 mmol/L 102 100 101  CO2 20 - 29 mmol/L 25 26 25   Calcium 8.7 - 10.2 mg/dL 9.0 9.3 8.9  Total Protein 6.0 - 8.5 g/dL 7.0 7.2 7.0  Total Bilirubin 0.0 - 1.2 mg/dL 0.8 0.9 0.9  Alkaline Phos 39 - 117 IU/L 86 84 93  AST 0 - 40 IU/L 23 19 23   ALT 0 - 32 IU/L 16 17 27    No abdominal imaging  Assessment: Encounter Diagnosis  Name Primary?  . Elevated ferritin Yes    Modestly elevated ferritin with normal iron level, last known hemoglobin normal.  Doubt hemochromatosis.  Discussed how ferritin can be elevated for other reasons besides iron overload.  Plan:  CBC, iron, TIBC and ferritin (to allow complete assessment of iron saturation), hepatic function panel  If iron  saturation normal, no genetic testing for hemochromatosis needed.   30 minutes were spent on this encounter (including chart review, history/exam, counseling/coordination of care, and documentation)   Nelida Meuse III  CC: Referring provider noted above

## 2019-06-03 NOTE — Patient Instructions (Signed)
If you are age 51 or older, your body mass index should be between 23-30. Your Body mass index is 27.96 kg/m. If this is out of the aforementioned range listed, please consider follow up with your Primary Care Provider.  If you are age 63 or younger, your body mass index should be between 19-25. Your Body mass index is 27.96 kg/m. If this is out of the aformentioned range listed, please consider follow up with your Primary Care Provider.   Your provider has requested that you go to the basement level for lab work before leaving today. Press "B" on the elevator. The lab is located at the first door on the left as you exit the elevator.  Due to recent changes in healthcare laws, you may see the results of your imaging and laboratory studies on MyChart before your provider has had a chance to review them.  We understand that in some cases there may be results that are confusing or concerning to you. Not all laboratory results come back in the same time frame and the provider may be waiting for multiple results in order to interpret others.  Please give [place] 48 hours in order for your provider to thoroughly review all the results before contacting the office for clarification of your results.   It was a pleasure to see you today!  Dr. Loletha Carrow

## 2019-06-07 ENCOUNTER — Encounter: Payer: Self-pay | Admitting: *Deleted

## 2019-08-16 ENCOUNTER — Other Ambulatory Visit: Payer: Self-pay | Admitting: Obstetrics and Gynecology

## 2019-08-24 ENCOUNTER — Other Ambulatory Visit (INDEPENDENT_AMBULATORY_CARE_PROVIDER_SITE_OTHER): Payer: 59

## 2019-08-24 ENCOUNTER — Other Ambulatory Visit: Payer: Self-pay

## 2019-08-24 DIAGNOSIS — R7989 Other specified abnormal findings of blood chemistry: Secondary | ICD-10-CM

## 2019-08-25 LAB — VITAMIN D 25 HYDROXY (VIT D DEFICIENCY, FRACTURES): Vit D, 25-Hydroxy: 54.7 ng/mL (ref 30.0–100.0)

## 2019-08-26 ENCOUNTER — Telehealth: Payer: Self-pay

## 2019-08-26 MED ORDER — VITAMIN D (ERGOCALCIFEROL) 1.25 MG (50000 UNIT) PO CAPS: 50000.0000 [IU] | ORAL_CAPSULE | ORAL | 0 refills | Status: DC

## 2019-08-26 NOTE — Telephone Encounter (Signed)
Called patient and left message to call Nataly Pacifico, CMA. 

## 2019-08-26 NOTE — Telephone Encounter (Signed)
Spoke with patient and reviewed Vit D results and advised will send new Rx to pharmacy on file([facility]).  AEX 10-20-19 with Dr.Silva.

## 2019-08-26 NOTE — Telephone Encounter (Signed)
-----   Message from Salvadore Dom, MD sent at 08/25/2019  5:58 PM EDT ----- Please let the patient know that her vit D level is normal and change her script to 50,000 IU every other week. 3 month supply. She is due with her annual with Dr Quincy Simmonds in a few months.

## 2019-10-19 NOTE — Progress Notes (Signed)
49 y.o. G4P1001 Married [place] American female here for annual exam.    On vit D replacement.   Some night sweats.  Tolerating it well.  Received her Covid vaccine.   PCP: Sallee Lange, MD    No LMP recorded (lmp unknown). Patient has had a hysterectomy.           Sexually active: Yes.    The current method of family planning is status post hysterectomy.    Exercising: Yes.    walking Smoker:  no  Health Maintenance: Pap: 2014 Neg History of abnormal Pap:  no MMG: 04-07-19 3D/Neg/density C/BiRads1 Colonoscopy:  04/28/17 normal f/u 10 years BMD:   n/a  Result  n/a TDaP: 2015 Gardasil:   no HIV: Neg in pregnancy Hep C:Never Screening Labs:  PCP.    reports that she has never smoked. She has never used smokeless tobacco. She reports current alcohol use of about 1.0 standard drinks of alcohol per week. She reports that she does not use drugs.  Past Medical History:  Diagnosis Date  . Anxiety   . Back problem   . Bursitis of shoulder, right   . Depression   . Elevated hemoglobin A1c 2017  . Low vitamin D level   . Migraines   . Uterine fibroid     Past Surgical History:  Procedure Laterality Date  . ABDOMINAL HYSTERECTOMY  2010   TAH--Dr. Quincy Simmonds  . MYOMECTOMY  2006   -Dr. Quincy Simmonds  . TUBAL LIGATION  2002   -Dr. Quincy Simmonds    Current Outpatient Medications  Medication Sig Dispense Refill  . ALPRAZolam (XANAX) 1 MG tablet TAKE ONE-HALF TO 1 TABLET BY MOUTH TWICE DAILY AS NEEDED 40 tablet 2  . Vitamin D, Ergocalciferol, (DRISDOL) 1.25 MG (50000 UNIT) CAPS capsule Take 1 capsule (50,000 Units total) by mouth every 14 (fourteen) days. 12 capsule 0   Current Facility-Administered Medications  Medication Dose Route Frequency Provider Last Rate Last Admin  . 0.9 %  sodium chloride infusion  500 mL Intravenous Once Doran Stabler, MD        Family History  Adopted: Yes  Problem Relation Age of Onset  . Breast cancer Neg Hx     Review of Systems  All other systems  reviewed and are negative.   Exam:   BP 122/88 (Cuff Size: Large)   Pulse 76   Temp (!) 97.1 F (36.2 C) (Temporal)   Resp 16   Ht 5' 4.5" (1.638 m)   Wt 181 lb 12.8 oz (82.5 kg)   LMP  (LMP Unknown)   BMI 30.72 kg/m     General appearance: alert, cooperative and appears stated age Head: normocephalic, without obvious abnormality, atraumatic Neck: no adenopathy, supple, symmetrical, trachea midline and thyroid normal to inspection and palpation Lungs: clear to auscultation bilaterally Breasts: normal appearance, no masses or tenderness, No nipple retraction or dimpling, No nipple discharge or bleeding, No axillary adenopathy Heart: regular rate and rhythm Abdomen: soft, non-tender; no masses, no organomegaly Extremities: extremities normal, atraumatic, no cyanosis or edema Skin: skin color, texture, turgor normal. No rashes or lesions Lymph nodes: cervical, supraclavicular, and axillary nodes normal. Neurologic: grossly normal  Pelvic: External genitalia:  no lesions              No abnormal inguinal nodes palpated.              Urethra:  normal appearing urethra with no masses, tenderness or lesions  Bartholins and Skenes: normal                 Vagina: normal appearing vagina with normal color and discharge, no lesions              Cervix: absent              Pap taken: Yes.   Bimanual Exam:  Uterus:  absent              Adnexa: no mass, fullness, tenderness              Rectal exam: Yes.  .  Confirms.              Anus:  normal sphincter tone, no lesions  Chaperone was present for exam.  Assessment:   Well woman visit with normal exam. Status post TAH. Remote history of abnormal pap. Hx low vit D.  On vit D 50,00 IU every 2 weeks.   Plan: Mammogram screening discussed. Self breast awareness reviewed. Pap and HR HPV as above. Guidelines for Calcium, Vitamin D, regular exercise program including cardiovascular and weight bearing exercise. Needs vit D  recheck in July, 2021.   Follow up annually and prn.   After visit summary provided.

## 2019-10-20 ENCOUNTER — Other Ambulatory Visit: Payer: Self-pay

## 2019-10-20 ENCOUNTER — Ambulatory Visit: Payer: No Typology Code available for payment source | Admitting: Obstetrics and Gynecology

## 2019-10-20 ENCOUNTER — Encounter: Payer: Self-pay | Admitting: Obstetrics and Gynecology

## 2019-10-20 VITALS — BP 122/88 | HR 76 | Temp 97.1°F | Resp 16 | Ht 64.5 in | Wt 181.8 lb

## 2019-10-20 DIAGNOSIS — R7989 Other specified abnormal findings of blood chemistry: Secondary | ICD-10-CM | POA: Diagnosis not present

## 2019-10-20 DIAGNOSIS — Z01419 Encounter for gynecological examination (general) (routine) without abnormal findings: Secondary | ICD-10-CM

## 2019-10-20 NOTE — Patient Instructions (Signed)

## 2019-11-04 ENCOUNTER — Other Ambulatory Visit: Payer: No Typology Code available for payment source

## 2019-12-30 ENCOUNTER — Other Ambulatory Visit: Payer: Self-pay

## 2019-12-30 ENCOUNTER — Other Ambulatory Visit (INDEPENDENT_AMBULATORY_CARE_PROVIDER_SITE_OTHER): Payer: No Typology Code available for payment source

## 2019-12-30 DIAGNOSIS — R7989 Other specified abnormal findings of blood chemistry: Secondary | ICD-10-CM

## 2019-12-31 ENCOUNTER — Ambulatory Visit: Payer: No Typology Code available for payment source | Admitting: Family Medicine

## 2019-12-31 ENCOUNTER — Encounter: Payer: Self-pay | Admitting: Family Medicine

## 2019-12-31 VITALS — BP 140/86 | HR 84 | Temp 97.4°F | Wt 180.4 lb

## 2019-12-31 DIAGNOSIS — R5383 Other fatigue: Secondary | ICD-10-CM | POA: Insufficient documentation

## 2019-12-31 DIAGNOSIS — R55 Syncope and collapse: Secondary | ICD-10-CM | POA: Diagnosis not present

## 2019-12-31 DIAGNOSIS — Z79899 Other long term (current) drug therapy: Secondary | ICD-10-CM

## 2019-12-31 DIAGNOSIS — Z1329 Encounter for screening for other suspected endocrine disorder: Secondary | ICD-10-CM

## 2019-12-31 DIAGNOSIS — F418 Other specified anxiety disorders: Secondary | ICD-10-CM

## 2019-12-31 LAB — VITAMIN D 25 HYDROXY (VIT D DEFICIENCY, FRACTURES): Vit D, 25-Hydroxy: 28.2 ng/mL — ABNORMAL LOW (ref 30.0–100.0)

## 2019-12-31 MED ORDER — ALPRAZOLAM 1 MG PO TABS: ORAL_TABLET | ORAL | 1 refills | Status: DC

## 2019-12-31 NOTE — Progress Notes (Signed)
Patient ID: Oris Drone, female    DOB: 1970/05/23, 49 y.o.   MRN: [id number]   Chief Complaint  Patient presents with  . Anxiety    Saturday patient was out in the heat and started feeling very uneasy, came close to passing out. She is use to walking and exercising in the heat but now is cautious about doing so. Went out to walk again yesterday and did not feel right and was moving very slowly.    Subjective:    HPI Reports being out in the sun on Saturday, had small alcoholic drink and started feeling anxious, SOB, and profusely sweating. She felt like she was going to pass out, rested in air conditioned car. Has not felt "normal" since Saturday. Denies chest pain/pressure or shortness of breath since Saturday.  Her baseline is she is able to walk daily for 2.5 miles without difficulty. Denies h/a, vision changes, and being awakened from her sleep with any pain.  Medical History Genea has a past medical history of Anxiety, Back problem, Bursitis of shoulder, right, Depression, Elevated hemoglobin A1c (2017), Low vitamin D level, Migraines, and Uterine fibroid.   Outpatient Encounter Medications as of 12/31/2019  Medication Sig  . ALPRAZolam (XANAX) 1 MG tablet TAKE ONE-HALF TO 1 TABLET BY MOUTH TWICE DAILY AS NEEDED  . Vitamin D, Ergocalciferol, (DRISDOL) 1.25 MG (50000 UNIT) CAPS capsule Take 1 capsule (50,000 Units total) by mouth every 14 (fourteen) days.  . [DISCONTINUED] ALPRAZolam (XANAX) 1 MG tablet TAKE ONE-HALF TO 1 TABLET BY MOUTH TWICE DAILY AS NEEDED   Facility-Administered Encounter Medications as of 12/31/2019  Medication  . 0.9 %  sodium chloride infusion     Review of Systems  Constitutional: Negative for fever.  HENT: Negative.   Eyes: Negative.   Respiratory: Positive for shortness of breath.        Only on Saturday associated with episode.  Cardiovascular: Negative for chest pain and palpitations.  Gastrointestinal: Negative.   Endocrine:  Negative.   Psychiatric/Behavioral:       History of anxiety. Since Saturday has used Xanax daily.     Vitals BP 140/86   Pulse 84   Temp (!) 97.4 F (36.3 C)   Wt 180 lb 6.4 oz (81.8 kg)   LMP  (LMP Unknown)   SpO2 99%   BMI 30.49 kg/m   Objective:   Physical Exam Vitals and nursing note reviewed.  Constitutional:      General: She is not in acute distress.    Appearance: Normal appearance. She is not ill-appearing.  Cardiovascular:     Rate and Rhythm: Normal rate and regular rhythm.     Pulses: Normal pulses.     Heart sounds: Normal heart sounds.  Pulmonary:     Effort: Pulmonary effort is normal.     Breath sounds: Normal breath sounds.  Abdominal:     General: Bowel sounds are normal.  Skin:    General: Skin is warm and dry.  Neurological:     Mental Status: She is alert and oriented to person, place, and time.  Psychiatric:        Behavior: Behavior normal.      Assessment and Plan   1. Other fatigue - CBC with Differential/Platelet - Comprehensive Metabolic Panel (CMET) - TSH - Lipid panel  2. Screening for thyroid disorder - CBC with Differential/Platelet - Comprehensive Metabolic Panel (CMET) - TSH - Lipid panel  3. High risk medication use - CBC with Differential/Platelet -  Comprehensive Metabolic Panel (CMET) - TSH - Lipid panel  4. Near syncope  5. Anxiety about health - ALPRAZolam (XANAX) 1 MG tablet; TAKE ONE-HALF TO 1 TABLET BY MOUTH TWICE DAILY AS NEEDED  Dispense: 30 tablet; Refill: 1   Morley presents today after experiencing near syncope on Saturday (details above). This has caused her much concern, as she has not returned to her normal level of well-being since that time. It is likely that this episode was related to heat and alcohol exposure, but we need to rule out any electrolyte abnormality, anemia, and thyroid issues.   She has been feeling very anxious about her  health since this occurred and I agree that it could be  concerning and we need labs to make sure. She has not returned to her normal walking routine since this episode- encouraged her to avoid the sun while exercising for now.  Her Blood pressure is elevated today in the office. I rechecked it: 142/92. She will keep a blood pressure log at home and bring this with her on her next visit next Friday. I instructed her that if her pressures are consistently > 140/90 to notify me before her follow-up visit.  Agrees with plan of care discussed today. Understands warning signs to seek further care: if this happens again- go to ED immediately, if you develop chest pain or shortness of breath -- be seen immediately!  Understands to follow-up next week for a physical exam and to review labs at that visit. Will notify if any concerning findings are found.  Dorena Bodo, NP

## 2019-12-31 NOTE — Patient Instructions (Addendum)
Keep blood pressure log- let us know if you are consistently > 140/90 Make appointment for next Friday for a physical: we will need the results of your labs before then.    Preventing Heat Exhaustion, Adult  Heat exhaustion happens when your body gets too hot (overheated) from hot weather or from exercise. Untreated heat exhaustion could lead to heat stroke. Heat stroke can be deadly. How can heat exhaustion affect me? Early warning signs of heat exhaustion are:  Weakness.  Fatigue.  Stomach cramps.  Arm pain.  Leg cramps. Later symptoms of heat exhaustion include:  Heavy sweating.  Clammy skin.  Rapid, weak pulse.  Nausea or vomiting.  Dizziness.  Headache.  Fainting. If you have signs and symptoms of heat exhaustion, move to a cool place, loosen your clothing, and drink water or a sports drink. Then, put cool, wet compresses on your body or get into a cool bath or shower. What can increase my risk? People who work or exercise outside in hot weather have the highest risk of heat exhaustion. You may also be at higher risk if you:  Are over age 76. Older adults have a greater risk for heat exhaustion than younger adults.  Are overweight.  Have high blood pressure.  Have heart disease. What actions can I take to prevent heat exhaustion?      Avoid being outside on very hot days. Check your local news for extreme heat alerts or warnings.  In extreme heat, stay in an air-conditioned environment until the temperature cools off.  Check with your health care provider before starting any new exercise or activity. Ask about any health conditions or medicines that might increase your risk for heat exhaustion.  Wear lightweight, light-colored, and loose-fitting clothing in warm weather.  Do outdoor activities when it is cooler. This may be in the morning, late afternoon, or evening. Take breaks in the shade.  Do not work or exercise in the heat when you feel unwell  or have been sick.  Start any new work or exercise activity gradually.  Protect yourself from the sun by wearing a broad-brimmed hat and using at least SPF 15 broad-spectrum sunscreen.  Drink enough water or sports drink to keep your urine pale yellow. When it is hot, drink every 15 to 20 minutes, even if you are not thirsty.  Do not go out in the heat after a heavy meal.  Do not drink alcohol or caffeinated drinks when it is very hot outside.  If you have friends or family members who are older adults: ? Do not leave an older adult alone in a hot car. ? Make sure older adults have access to air-conditioning on very hot days. Remind them to drink enough fluids. Check on them at least twice a day if you can. Where to find more information  Centers for Disease Control and Prevention (CDC): PurpleGadgets.be  American Academy of Family Physicians (AAFP): https://familydoctor.org/condition/heat-exhaustion-heatstroke  Optometrist (AAOS): https://orthoinfo.aaos.org/en/diseases--conditions/heat-injury-and-heat-exhaustion Contact a health care provider if:  You faint.  You feel weak or dizzy.  You have any signs or symptoms of heat exhaustion that last more than one hour. Get help right away if you have signs of heat stroke:  Body temperature of 103F (39.4C) or higher.  Hot, dry, red skin.  Fast, thumping pulse.  Confusion.  Loss of consciousness. Summary  Heat exhaustion happens when your body gets overheated and cannot cool down.  Avoid being outside on very hot days. Check your  local news for extreme heat alerts or warnings. When you are out in the heat, take steps to protect yourself from the sun and stay hydrated.  If you have signs or symptoms of heat exhaustion, get out of the heat, drink fluids, take steps to cool down, and contact a health care provider.  Get help right away if you have signs or  symptoms of heat stroke. This information is not intended to replace advice given to you by your health care provider. Make sure you discuss any questions you have with your health care provider. Document Revised: 01/22/2019 Document Reviewed: 08/06/2017 Elsevier Patient Education  [place] DASH stands for "Dietary Approaches to Stop Hypertension." The DASH eating plan is a healthy eating plan that has been shown to reduce high blood pressure (hypertension). It may also reduce your risk for type 2 diabetes, heart disease, and stroke. The DASH eating plan may also help with weight loss. What are tips for following this plan?  General guidelines  Avoid eating more than 2,300 mg (milligrams) of salt (sodium) a day. If you have hypertension, you may need to reduce your sodium intake to 1,500 mg a day.  Limit alcohol intake to no more than 1 drink a day for nonpregnant women and 2 drinks a day for men. One drink equals 12 oz of beer, 5 oz of wine, or 1 oz of hard liquor.  Work with your health care provider to maintain a healthy body weight or to lose weight. Ask what an ideal weight is for you.  Get at least 30 minutes of exercise that causes your heart to beat faster (aerobic exercise) most days of the week. Activities may include walking, swimming, or biking.  Work with your health care provider or diet and nutrition specialist (dietitian) to adjust your eating plan to your individual calorie needs. Reading food labels   Check food labels for the amount of sodium per serving. Choose foods with less than 5 percent of the Daily Value of sodium. Generally, foods with less than 300 mg of sodium per serving fit into this eating plan.  To find whole grains, look for the word "whole" as the first word in the ingredient list. Shopping  Buy products labeled as "low-sodium" or "no salt added."  Buy fresh foods. Avoid canned foods and premade or frozen  meals. Cooking  Avoid adding salt when cooking. Use salt-free seasonings or herbs instead of table salt or sea salt. Check with your health care provider or pharmacist before using salt substitutes.  Do not fry foods. Cook foods using healthy methods such as baking, boiling, grilling, and broiling instead.  Cook with heart-healthy oils, such as olive, canola, soybean, or sunflower oil. Meal planning  Eat a balanced diet that includes: ? 5 or more servings of fruits and vegetables each day. At each meal, try to fill half of your plate with fruits and vegetables. ? Up to 6-8 servings of whole grains each day. ? Less than 6 oz of lean meat, poultry, or fish each day. A 3-oz serving of meat is about the same size as a deck of cards. One egg equals 1 oz. ? 2 servings of low-fat dairy each day. ? A serving of nuts, seeds, or beans 5 times each week. ? Heart-healthy fats. Healthy fats called Omega-3 fatty acids are found in foods such as flaxseeds and coldwater fish, like sardines, salmon, and mackerel.  Limit how much you eat of the following: ?  Canned or prepackaged foods. ? Food that is high in trans fat, such as fried foods. ? Food that is high in saturated fat, such as fatty meat. ? Sweets, desserts, sugary drinks, and other foods with added sugar. ? Full-fat dairy products.  Do not salt foods before eating.  Try to eat at least 2 vegetarian meals each week.  Eat more home-cooked food and less restaurant, buffet, and fast food.  When eating at a restaurant, ask that your food be prepared with less salt or no salt, if possible. What foods are recommended? The items listed may not be a complete list. Talk with your dietitian about what dietary choices are best for you. Grains Whole-grain or whole-wheat bread. Whole-grain or whole-wheat pasta. Brown rice. [place]. Bulgur. Whole-grain and low-sodium cereals. Pita bread. Low-fat, low-sodium crackers. Whole-wheat flour  tortillas. Vegetables Fresh or frozen vegetables (raw, steamed, roasted, or grilled). Low-sodium or reduced-sodium tomato and vegetable juice. Low-sodium or reduced-sodium tomato sauce and tomato paste. Low-sodium or reduced-sodium canned vegetables. Fruits All fresh, dried, or frozen fruit. Canned fruit in natural juice (without added sugar). Meat and other protein foods Skinless chicken or [place]. Ground chicken or [place]. Pork with fat trimmed off. Fish and seafood. Egg whites. Dried beans, peas, or lentils. Unsalted nuts, nut butters, and seeds. Unsalted canned beans. Lean cuts of beef with fat trimmed off. Low-sodium, lean deli meat. Dairy Low-fat (1%) or fat-free (skim) milk. Fat-free, low-fat, or reduced-fat cheeses. Nonfat, low-sodium ricotta or cottage cheese. Low-fat or nonfat yogurt. Low-fat, low-sodium cheese. Fats and oils Soft margarine without trans fats. Vegetable oil. Low-fat, reduced-fat, or light mayonnaise and salad dressings (reduced-sodium). Canola, safflower, olive, soybean, and sunflower oils. Avocado. Seasoning and other foods Herbs. Spices. Seasoning mixes without salt. Unsalted popcorn and pretzels. Fat-free sweets. What foods are not recommended? The items listed may not be a complete list. Talk with your dietitian about what dietary choices are best for you. Grains Baked goods made with fat, such as croissants, muffins, or some breads. Dry pasta or rice meal packs. Vegetables Creamed or fried vegetables. Vegetables in a cheese sauce. Regular canned vegetables (not low-sodium or reduced-sodium). Regular canned tomato sauce and paste (not low-sodium or reduced-sodium). Regular tomato and vegetable juice (not low-sodium or reduced-sodium). Angie Fava. Olives. Fruits Canned fruit in a light or heavy syrup. Fried fruit. Fruit in cream or butter sauce. Meat and other protein foods Fatty cuts of meat. Ribs. Fried meat. Berniece Salines. Sausage. Bologna and other processed lunch meats.  Salami. Fatback. Hotdogs. Bratwurst. Salted nuts and seeds. Canned beans with added salt. Canned or smoked fish. Whole eggs or egg yolks. Chicken or [place] with skin. Dairy Whole or 2% milk, cream, and half-and-half. Whole or full-fat cream cheese. Whole-fat or sweetened yogurt. Full-fat cheese. Nondairy creamers. Whipped toppings. Processed cheese and cheese spreads. Fats and oils Butter. Stick margarine. Lard. Shortening. Ghee. Bacon fat. Tropical oils, such as coconut, palm kernel, or palm oil. Seasoning and other foods Salted popcorn and pretzels. Onion salt, garlic salt, seasoned salt, table salt, and sea salt. Worcestershire sauce. Tartar sauce. Barbecue sauce. Teriyaki sauce. Soy sauce, including reduced-sodium. Steak sauce. Canned and packaged gravies. Fish sauce. Oyster sauce. Cocktail sauce. Horseradish that you find on the shelf. Ketchup. Mustard. Meat flavorings and tenderizers. Bouillon cubes. Hot sauce and Tabasco sauce. Premade or packaged marinades. Premade or packaged taco seasonings. Relishes. Regular salad dressings. Where to find more information:  National Heart, Lung, and [place]: https://wilson-eaton.com/  American Heart Association: www.heart.org Summary  The DASH eating plan is a healthy eating plan that has been shown to reduce high blood pressure (hypertension). It may also reduce your risk for type 2 diabetes, heart disease, and stroke.  With the DASH eating plan, you should limit salt (sodium) intake to 2,300 mg a day. If you have hypertension, you may need to reduce your sodium intake to 1,500 mg a day.  When on the DASH eating plan, aim to eat more fresh fruits and vegetables, whole grains, lean proteins, low-fat dairy, and heart-healthy fats.  Work with your health care provider or diet and nutrition specialist (dietitian) to adjust your eating plan to your individual calorie needs. This information is not intended to replace advice given to you by your health  care provider. Make sure you discuss any questions you have with your health care provider. Document Revised: 04/11/2017 Document Reviewed: 04/22/2016 Elsevier Patient Education  2020 [employer].

## 2020-01-01 LAB — COMPREHENSIVE METABOLIC PANEL
ALT: 19 IU/L (ref 0–32)
AST: 21 IU/L (ref 0–40)
Albumin/Globulin Ratio: 1.4 (ref 1.2–2.2)
Albumin: 4.5 g/dL (ref 3.8–4.9)
Alkaline Phosphatase: 103 IU/L (ref 48–121)
BUN/Creatinine Ratio: 7 — ABNORMAL LOW (ref 9–23)
BUN: 6 mg/dL (ref 6–24)
Bilirubin Total: 1 mg/dL (ref 0.0–1.2)
CO2: 25 mmol/L (ref 20–29)
Calcium: 9.3 mg/dL (ref 8.7–10.2)
Chloride: 99 mmol/L (ref 96–106)
Creatinine, Ser: 0.89 mg/dL (ref 0.57–1.00)
GFR calc Af Amer: 87 mL/min/{1.73_m2} (ref >59)
GFR calc non Af Amer: 75 mL/min/{1.73_m2} (ref >59)
Globulin, Total: 3.3 g/dL (ref 1.5–4.5)
Glucose: 85 mg/dL (ref 65–99)
Potassium: 4.2 mmol/L (ref 3.5–5.2)
Sodium: 138 mmol/L (ref 134–144)
Total Protein: 7.8 g/dL (ref 6.0–8.5)

## 2020-01-01 LAB — LIPID PANEL
Chol/HDL Ratio: 2.7 ratio (ref 0.0–4.4)
Cholesterol, Total: 177 mg/dL (ref 100–199)
HDL: 65 mg/dL (ref >39)
LDL Chol Calc (NIH): 101 mg/dL — ABNORMAL HIGH (ref 0–99)
Triglycerides: 56 mg/dL (ref 0–149)
VLDL Cholesterol Cal: 11 mg/dL (ref 5–40)

## 2020-01-01 LAB — CBC WITH DIFFERENTIAL/PLATELET
Basophils Absolute: 0.1 10*3/uL (ref 0.0–0.2)
Basos: 1 %
EOS (ABSOLUTE): 0.2 10*3/uL (ref 0.0–0.4)
Eos: 2 %
Hematocrit: 44.1 % (ref 34.0–46.6)
Hemoglobin: 14.4 g/dL (ref 11.1–15.9)
Immature Grans (Abs): 0 10*3/uL (ref 0.0–0.1)
Immature Granulocytes: 0 %
Lymphocytes Absolute: 2.4 10*3/uL (ref 0.7–3.1)
Lymphs: 29 %
MCH: 29.4 pg (ref 26.6–33.0)
MCHC: 32.7 g/dL (ref 31.5–35.7)
MCV: 90 fL (ref 79–97)
Monocytes Absolute: 0.5 10*3/uL (ref 0.1–0.9)
Monocytes: 6 %
Neutrophils Absolute: 5 10*3/uL (ref 1.4–7.0)
Neutrophils: 62 %
Platelets: 262 10*3/uL (ref 150–450)
RBC: 4.89 x10E6/uL (ref 3.77–5.28)
RDW: 13.5 % (ref 11.7–15.4)
WBC: 8.1 10*3/uL (ref 3.4–10.8)

## 2020-01-01 LAB — TSH: TSH: 0.924 u[IU]/mL (ref 0.450–4.500)

## 2020-01-07 ENCOUNTER — Other Ambulatory Visit: Payer: Self-pay

## 2020-01-07 ENCOUNTER — Ambulatory Visit (INDEPENDENT_AMBULATORY_CARE_PROVIDER_SITE_OTHER): Payer: No Typology Code available for payment source | Admitting: Family Medicine

## 2020-01-07 ENCOUNTER — Encounter: Payer: Self-pay | Admitting: Family Medicine

## 2020-01-07 VITALS — BP 136/82 | HR 86 | Temp 97.4°F | Ht 64.5 in | Wt 180.8 lb

## 2020-01-07 DIAGNOSIS — R55 Syncope and collapse: Secondary | ICD-10-CM

## 2020-01-07 NOTE — Patient Instructions (Signed)
Blood pressure log Go for a walk this weekend to make sure you are not going to have another episode. Send blood pressures through My Chart. If you do have another episode- call us and we will make a referral ASAP!!   Syncope Syncope is when you pass out (faint) for a short time. It is caused by a sudden decrease in blood flow to the brain. Signs that you may be about to pass out include:  Feeling dizzy or light-headed.  Feeling sick to your stomach (nauseous).  Seeing all white or all black.  Having cold, clammy skin. If you pass out, get help right away. Call your local emergency services (911 in the [place] Do not drive yourself to the hospital. Follow these instructions at home: Watch for any changes in your symptoms. Take these actions to stay safe and help with your symptoms: Lifestyle  Do not drive, use machinery, or play sports until your doctor says it is okay.  Do not drink alcohol.  Do not use any products that contain nicotine or tobacco, such as cigarettes and e-cigarettes. If you need help quitting, ask your doctor.  Drink enough fluid to keep your pee (urine) pale yellow. General instructions  Take over-the-counter and prescription medicines only as told by your doctor.  If you are taking blood pressure or heart medicine, sit up and stand up slowly. Spend a few minutes getting ready to sit and then stand. This can help you feel less dizzy.  Have someone stay with you until you feel stable.  If you start to feel like you might pass out, lie down right away and raise (elevate) your feet above the level of your heart. Breathe deeply and steadily. Wait until all of the symptoms are gone.  Keep all follow-up visits as told by your doctor. This is important. Get help right away if:  You have a very bad headache.  You pass out once or more than once.  You have pain in your chest, belly, or back.  You have a very fast or uneven heartbeat (palpitations).  It  hurts to breathe.  You are bleeding from your mouth or your bottom (rectum).  You have black or tarry poop (stool).  You have jerky movements that you cannot control (seizure).  You are confused.  You have trouble walking.  You are very weak.  You have vision problems. These symptoms may be an emergency. Do not wait to see if the symptoms will go away. Get medical help right away. Call your local emergency services (911 in the [place] Do not drive yourself to the hospital. Summary  Syncope is when you pass out (faint) for a short time. It is caused by a sudden decrease in blood flow to the brain.  Signs that you may be about to faint include feeling dizzy, light-headed, or sick to your stomach, seeing all white or all black, or having cold, clammy skin.  If you start to feel like you might pass out, lie down right away and raise (elevate) your feet above the level of your heart. Breathe deeply and steadily. Wait until all of the symptoms are gone. This information is not intended to replace advice given to you by your health care provider. Make sure you discuss any questions you have with your health care provider. Document Revised: 06/11/2017 Document Reviewed: 06/11/2017 Elsevier Patient Education  2020 [employer].

## 2020-01-07 NOTE — Progress Notes (Signed)
Patient ID: Oris Drone, female    DOB: 03-Feb-1971, 49 y.o.   MRN: [id number]   Chief Complaint  Patient presents with  . Anxiety    follow up- discuss recent labs   Subjective:    HPI Follow- up from near syncope  Episode 2 weeks ago. No new events.  Medical History Zeniyah has a past medical history of Anxiety, Back problem, Bursitis of shoulder, right, Depression, Elevated hemoglobin A1c (2017), Low vitamin D level, Migraines, and Uterine fibroid.   Outpatient Encounter Medications as of 01/07/2020  Medication Sig  . ALPRAZolam (XANAX) 1 MG tablet TAKE ONE-HALF TO 1 TABLET BY MOUTH TWICE DAILY AS NEEDED  . Vitamin D, Ergocalciferol, (DRISDOL) 1.25 MG (50000 UNIT) CAPS capsule Take 1 capsule (50,000 Units total) by mouth every 14 (fourteen) days.   Facility-Administered Encounter Medications as of 01/07/2020  Medication  . 0.9 %  sodium chloride infusion     Review of Systems  Constitutional: Positive for fatigue.  HENT: Negative.   Eyes: Negative.   Respiratory: Negative for chest tightness and shortness of breath.   Cardiovascular: Negative for chest pain and palpitations.  Gastrointestinal: Negative.   Endocrine: Negative.   Genitourinary: Negative.   Musculoskeletal: Negative.   Skin: Negative.      Vitals BP 136/82   Pulse 86   Temp (!) 97.4 F (36.3 C) (Oral)   Ht 5' 4.5" (1.638 m)   Wt 180 lb 12.8 oz (82 kg)   LMP  (LMP Unknown)   SpO2 98%   BMI 30.55 kg/m   Objective:   Physical Exam Vitals and nursing note reviewed.  Constitutional:      Appearance: Normal appearance.  Cardiovascular:     Rate and Rhythm: Normal rate and regular rhythm.     Pulses: Normal pulses.     Heart sounds: Normal heart sounds.  Pulmonary:     Effort: Pulmonary effort is normal.     Breath sounds: Normal breath sounds.  Skin:    General: Skin is warm and dry.  Neurological:     Mental Status: She is alert and oriented to person, place, and time.    Psychiatric:        Mood and Affect: Mood normal.        Behavior: Behavior normal.     Comments: Concerned about episode and has not resumed normal walking routine. No chest pain or shortness of breath.      Assessment and Plan   1. Near syncope - PR ELECTROCARDIOGRAM, COMPLETE  EKG in office NSR without ST segment elevation. Reviewed lab work: all looks good.   My only concern is her blood pressure: need to make sure we have out of office readings before final evaluation can be made.    No new events. Has not resumed walking routine due to concern it will happen again. Forgot to do blood pressure log. Will remind patient on My Chart for this information. BP recheck: 130/92 GAD-7: 6 has taken Xanax 3 times this week.   Has not had any chest pain, pressure, or shortness of breath again.  Consider cards consult if she has a repeat episode. She will let me know. She will attempt to walk/ exercise this weekend (with someone) to make sure this doesn't happen again.   Agrees with plan of care discussed today. Understands warning signs to seek further care: chest pain, shortness of breath, extreme fatigue, another near syncopal episode. Understands to follow-up if symptoms do not improve  or if anything changes. She will communicate through My Chart her blood pressure readings.  Dorena Bodo, FNP-C

## 2020-01-10 ENCOUNTER — Telehealth: Payer: Self-pay | Admitting: Family Medicine

## 2020-01-10 NOTE — Telephone Encounter (Signed)
Left message to return call.   From: Chalmers Guest, NP  Sent: 01/10/2020  8:52 AM EDT  To: [name], LPN  Subject: checking on her                  Carrie Gordon,   Please call [name] and thank her for sending a BP. Please send a few more this week.   Also,   1) was she able to resume her physical activity this weekend?   2) if yes, did she have any symptoms: heart palpations, shortness of breath, or anything similar to 2 weeks ago?   3) If she says she did not exercise, tell it I think it would be best to go ahead and send her to cardiology.   If she said she exercised and she felt fine- tell her that is great and to continue to monitor how she feels with exercise and let me know.   Thanks,  Santiago Glad

## 2020-01-12 ENCOUNTER — Encounter: Payer: Self-pay | Admitting: Family Medicine

## 2020-01-12 NOTE — Telephone Encounter (Signed)
Patient states she did not return to physical activity this weekend. Patient was not having any symptoms it was more a conscious choice. Advised patient that Carrie Glad NP recommended referral to Cardiology if patient was was unable to return to activity.

## 2020-02-08 ENCOUNTER — Other Ambulatory Visit: Payer: Self-pay | Admitting: Obstetrics and Gynecology

## 2020-02-09 ENCOUNTER — Encounter: Payer: Self-pay | Admitting: Family Medicine

## 2020-02-09 ENCOUNTER — Telehealth: Payer: Self-pay

## 2020-02-09 NOTE — Telephone Encounter (Signed)
Patient would like to come in for blood pressure check.

## 2020-02-09 NOTE — Telephone Encounter (Signed)
Left message to call Maille Halliwell, RN at [facility] [phone].   

## 2020-02-09 NOTE — Telephone Encounter (Signed)
Nurses Please make sure cardiology consult was put in it Please connect with patient I can see her tomorrow or I can see her Friday morning at 8:40 AM  If she cannot come in on Wednesday as I could see her next week What are her blood pressures currently we may need to add medication?

## 2020-02-10 NOTE — Telephone Encounter (Signed)
Please put her on the schedule for 8:40 AM with Dr. Nicki Reaper on Friday

## 2020-02-11 ENCOUNTER — Other Ambulatory Visit: Payer: Self-pay

## 2020-02-11 ENCOUNTER — Encounter: Payer: Self-pay | Admitting: Family Medicine

## 2020-02-11 ENCOUNTER — Ambulatory Visit (INDEPENDENT_AMBULATORY_CARE_PROVIDER_SITE_OTHER): Payer: No Typology Code available for payment source | Admitting: Family Medicine

## 2020-02-11 VITALS — BP 156/97 | HR 96 | Temp 97.4°F | Wt 177.0 lb

## 2020-02-11 DIAGNOSIS — R0789 Other chest pain: Secondary | ICD-10-CM | POA: Diagnosis not present

## 2020-02-11 DIAGNOSIS — I10 Essential (primary) hypertension: Secondary | ICD-10-CM | POA: Diagnosis not present

## 2020-02-11 MED ORDER — AMLODIPINE BESYLATE 5 MG PO TABS: 5.0000 mg | ORAL_TABLET | Freq: Every day | ORAL | 5 refills | Status: DC

## 2020-02-11 NOTE — Patient Instructions (Signed)
DASH Eating Plan DASH stands for "Dietary Approaches to Stop Hypertension." The DASH eating plan is a healthy eating plan that has been shown to reduce high blood pressure (hypertension). It may also reduce your risk for type 2 diabetes, heart disease, and stroke. The DASH eating plan may also help with weight loss. What are tips for following this plan?  General guidelines  Avoid eating more than 2,300 mg (milligrams) of salt (sodium) a day. If you have hypertension, you may need to reduce your sodium intake to 1,500 mg a day.  Limit alcohol intake to no more than 1 drink a day for nonpregnant women and 2 drinks a day for men. One drink equals 12 oz of beer, 5 oz of wine, or 1 oz of hard liquor.  Work with your health care provider to maintain a healthy body weight or to lose weight. Ask what an ideal weight is for you.  Get at least 30 minutes of exercise that causes your heart to beat faster (aerobic exercise) most days of the week. Activities may include walking, swimming, or biking.  Work with your health care provider or diet and nutrition specialist (dietitian) to adjust your eating plan to your individual calorie needs. Reading food labels   Check food labels for the amount of sodium per serving. Choose foods with less than 5 percent of the Daily Value of sodium. Generally, foods with less than 300 mg of sodium per serving fit into this eating plan.  To find whole grains, look for the word "whole" as the first word in the ingredient list. Shopping  Buy products labeled as "low-sodium" or "no salt added."  Buy fresh foods. Avoid canned foods and premade or frozen meals. Cooking  Avoid adding salt when cooking. Use salt-free seasonings or herbs instead of table salt or sea salt. Check with your health care provider or pharmacist before using salt substitutes.  Do not fry foods. Cook foods using healthy methods such as baking, boiling, grilling, and broiling instead.  Cook with  heart-healthy oils, such as olive, canola, soybean, or sunflower oil. Meal planning  Eat a balanced diet that includes: ? 5 or more servings of fruits and vegetables each day. At each meal, try to fill half of your plate with fruits and vegetables. ? Up to 6-8 servings of whole grains each day. ? Less than 6 oz of lean meat, poultry, or fish each day. A 3-oz serving of meat is about the same size as a deck of cards. One egg equals 1 oz. ? 2 servings of low-fat dairy each day. ? A serving of nuts, seeds, or beans 5 times each week. ? Heart-healthy fats. Healthy fats called Omega-3 fatty acids are found in foods such as flaxseeds and coldwater fish, like sardines, salmon, and mackerel.  Limit how much you eat of the following: ? Canned or prepackaged foods. ? Food that is high in trans fat, such as fried foods. ? Food that is high in saturated fat, such as fatty meat. ? Sweets, desserts, sugary drinks, and other foods with added sugar. ? Full-fat dairy products.  Do not salt foods before eating.  Try to eat at least 2 vegetarian meals each week.  Eat more home-cooked food and less restaurant, buffet, and fast food.  When eating at a restaurant, ask that your food be prepared with less salt or no salt, if possible. What foods are recommended? The items listed may not be a complete list. Talk with your dietitian about   what dietary choices are best for you. Grains Whole-grain or whole-wheat bread. Whole-grain or whole-wheat pasta. Brown rice. Oatmeal. Quinoa. Bulgur. Whole-grain and low-sodium cereals. Pita bread. Low-fat, low-sodium crackers. Whole-wheat flour tortillas. Vegetables Fresh or frozen vegetables (raw, steamed, roasted, or grilled). Low-sodium or reduced-sodium tomato and vegetable juice. Low-sodium or reduced-sodium tomato sauce and tomato paste. Low-sodium or reduced-sodium canned vegetables. Fruits All fresh, dried, or frozen fruit. Canned fruit in natural juice (without  added sugar). Meat and other protein foods Skinless chicken or turkey. Ground chicken or turkey. Pork with fat trimmed off. Fish and seafood. Egg whites. Dried beans, peas, or lentils. Unsalted nuts, nut butters, and seeds. Unsalted canned beans. Lean cuts of beef with fat trimmed off. Low-sodium, lean deli meat. Dairy Low-fat (1%) or fat-free (skim) milk. Fat-free, low-fat, or reduced-fat cheeses. Nonfat, low-sodium ricotta or cottage cheese. Low-fat or nonfat yogurt. Low-fat, low-sodium cheese. Fats and oils Soft margarine without trans fats. Vegetable oil. Low-fat, reduced-fat, or light mayonnaise and salad dressings (reduced-sodium). Canola, safflower, olive, soybean, and sunflower oils. Avocado. Seasoning and other foods Herbs. Spices. Seasoning mixes without salt. Unsalted popcorn and pretzels. Fat-free sweets. What foods are not recommended? The items listed may not be a complete list. Talk with your dietitian about what dietary choices are best for you. Grains Baked goods made with fat, such as croissants, muffins, or some breads. Dry pasta or rice meal packs. Vegetables Creamed or fried vegetables. Vegetables in a cheese sauce. Regular canned vegetables (not low-sodium or reduced-sodium). Regular canned tomato sauce and paste (not low-sodium or reduced-sodium). Regular tomato and vegetable juice (not low-sodium or reduced-sodium). Pickles. Olives. Fruits Canned fruit in a light or heavy syrup. Fried fruit. Fruit in cream or butter sauce. Meat and other protein foods Fatty cuts of meat. Ribs. Fried meat. Bacon. Sausage. Bologna and other processed lunch meats. Salami. Fatback. Hotdogs. Bratwurst. Salted nuts and seeds. Canned beans with added salt. Canned or smoked fish. Whole eggs or egg yolks. Chicken or turkey with skin. Dairy Whole or 2% milk, cream, and half-and-half. Whole or full-fat cream cheese. Whole-fat or sweetened yogurt. Full-fat cheese. Nondairy creamers. Whipped toppings.  Processed cheese and cheese spreads. Fats and oils Butter. Stick margarine. Lard. Shortening. Ghee. Bacon fat. Tropical oils, such as coconut, palm kernel, or palm oil. Seasoning and other foods Salted popcorn and pretzels. Onion salt, garlic salt, seasoned salt, table salt, and sea salt. Worcestershire sauce. Tartar sauce. Barbecue sauce. Teriyaki sauce. Soy sauce, including reduced-sodium. Steak sauce. Canned and packaged gravies. Fish sauce. Oyster sauce. Cocktail sauce. Horseradish that you find on the shelf. Ketchup. Mustard. Meat flavorings and tenderizers. Bouillon cubes. Hot sauce and Tabasco sauce. Premade or packaged marinades. Premade or packaged taco seasonings. Relishes. Regular salad dressings. Where to find more information:  National Heart, Lung, and [employer] Institute: www.nhlbi.nih.gov  American Heart Association: www.heart.org Summary  The DASH eating plan is a healthy eating plan that has been shown to reduce high blood pressure (hypertension). It may also reduce your risk for type 2 diabetes, heart disease, and stroke.  With the DASH eating plan, you should limit salt (sodium) intake to 2,300 mg a day. If you have hypertension, you may need to reduce your sodium intake to 1,500 mg a day.  When on the DASH eating plan, aim to eat more fresh fruits and vegetables, whole grains, lean proteins, low-fat dairy, and heart-healthy fats.  Work with your health care provider or diet and nutrition specialist (dietitian) to adjust your eating plan to your   individual calorie needs. This information is not intended to replace advice given to you by your health care provider. Make sure you discuss any questions you have with your health care provider. Document Revised: 04/11/2017 Document Reviewed: 04/22/2016 Else[employer] Patient Education  2020 [employer]  

## 2020-02-11 NOTE — Progress Notes (Signed)
Subjective:    Patient ID: Carrie Gordon, female    DOB: 02-07-1971, 49 y.o.   MRN: [id number]  HPI Patient comes in today with concerns of high blood pressure readings.  Patient reports chest discomfort that's hard to explain but resembles heart burn occurring 1 x per day and 1 headache this week.  Bp on her cuff 156/97 BP on office cuff 158/102  Review of Systems    Denies chest tightness currently Objective:   Physical Exam  Lungs clear heart regular HEENT benign blood pressure elevated Cardiology referral because of chest pains     Assessment & Plan:  HTN Amlodipine 5 mg daily Discussed this with her husband via phone as well as the patient Patient will monitor blood pressure readings We will follow-up in several weeks Follow-up sooner problems

## 2020-02-14 NOTE — Telephone Encounter (Signed)
Per review of Epic, patient was seen at her PCP on 02/11/20 for B/P recheck.   Routing to Dr. Antony Blackbird.   Encounter closed.

## 2020-02-16 ENCOUNTER — Encounter: Payer: Self-pay | Admitting: Family Medicine

## 2020-02-19 ENCOUNTER — Other Ambulatory Visit (INDEPENDENT_AMBULATORY_CARE_PROVIDER_SITE_OTHER): Payer: No Typology Code available for payment source | Admitting: *Deleted

## 2020-02-19 DIAGNOSIS — Z23 Encounter for immunization: Secondary | ICD-10-CM

## 2020-03-05 NOTE — Progress Notes (Signed)
Cardiology Office Note:   Date:  03/06/2020  NAME:  Carrie Gordon    MRN: [id number] DOB:  [birth year]   PCP:  Babs Sciara, MD  Cardiologist:  Reatha Harps, MD   Referring MD: Novella Olive, NP   Chief Complaint  Patient presents with  . Chest Pain    History of Present Illness:   Carrie Gordon is a 49 y.o. female with a hx of HTN, anxiety, migraines who is being seen today for the evaluation of chest pain at the request of Novella Olive, NP.  Total cholesterol 177, HDL 65, LDL 101, triglycerides 56.  TSH 0.92.  She reports she went to an outside festival in August here in [place].  This apparently was the taco and [employer].  She reports while she was there she had 1 margarita and one taco and just felt funny in her chest.  She reports that she also became short of breath.  She reports she felt fluttering in her chest.  She also describes it as pressure-like.  Apparently she sat down and symptoms got little better.  She did go to the medic tent and they gave her ice because she was overheated.  Apparently this improved her symptoms within 30 to 40 minutes.  She was also described as being sweaty and hot.  Over the next few weeks she has developed persistent symptoms.  She is also been diagnosed with high blood pressure start amlodipine.  This is controlled her blood pressure well.  She reports when she exerts herself she can get some tightness in her chest.  She also can get this at rest.  She also feels some fluttering in her chest.  She takes Xanax as well.  This apparently improves some of her symptoms.  She reports she is not been able to exercise like she used to.  Before all that she was walking up to 3 miles without any major limitations.  She reports when she walks now she gets symptoms of fluttering and tightness in her chest and then she has to stop.  She can then continue her activity.  Again this is associated with shortness of breath.   Symptoms have no identifiable trigger can occur anytime.  They last minutes and go away.  CVD risk factors include hypertension.  LDL cholesterol noted below.  She is not diabetic.  She is a never smoker.  She drinks alcohol in moderation.  No excess caffeine consumption reported.  She reports she is not stressed out.  She works in Doctor, hospital for [employer].  No major issues at work.  She is adopted so no family history is known.  Past Medical History: Past Medical History:  Diagnosis Date  . Anxiety   . Back problem   . Bursitis of shoulder, right   . Depression   . Elevated hemoglobin A1c 2017  . Hypertension   . Low vitamin D level   . Migraines   . Uterine fibroid     Past Surgical History: Past Surgical History:  Procedure Laterality Date  . ABDOMINAL HYSTERECTOMY  2010   TAH--Dr. Edward Jolly  . MYOMECTOMY  2006   -Dr. Edward Jolly  . TUBAL LIGATION  2002   -Dr. Edward Jolly    Current Medications: Current Meds  Medication Sig  . ALPRAZolam (XANAX) 1 MG tablet TAKE ONE-HALF TO 1 TABLET BY MOUTH TWICE DAILY AS NEEDED  . amLODipine (NORVASC) 5 MG tablet Take 1 tablet (5 mg total) by mouth  daily.  . Vitamin D, Ergocalciferol, (DRISDOL) 1.25 MG (50000 UNIT) CAPS capsule Take 1 capsule (50,000 Units total) by mouth every 14 (fourteen) days.   Current Facility-Administered Medications for the 03/06/20 encounter (Office Visit) with Sande Rives, MD  Medication  . 0.9 %  sodium chloride infusion     Allergies:    Patient has no known allergies.   Social History: Social History   Socioeconomic History  . Marital status: Married    Spouse name: Not on file  . Number of children: 1  . Years of education: Not on file  . Highest education level: Not on file  Occupational History  . Not on file  Tobacco Use  . Smoking status: Never Smoker  . Smokeless tobacco: Never Used  Vaping Use  . Vaping Use: Never used  Substance and Sexual Activity  . Alcohol use: Yes     Alcohol/week: 1.0 standard drink    Types: 1 Glasses of wine per week  . Drug use: No  . Sexual activity: Yes    Partners: Male    Birth control/protection: Surgical    Comment: TAH  Other Topics Concern  . Not on file  Social History Narrative  . Not on file   Social Determinants of Health   Financial Resource Strain:   . Difficulty of Paying Living Expenses: Not on file  Food Insecurity:   . Worried About Programme researcher, broadcasting/film/video in the Last Year: Not on file  . Ran Out of Food in the Last Year: Not on file  Transportation Needs:   . Lack of Transportation (Medical): Not on file  . Lack of Transportation (Non-Medical): Not on file  Physical Activity:   . Days of Exercise per Week: Not on file  . Minutes of Exercise per Session: Not on file  Stress:   . Feeling of Stress : Not on file  Social Connections:   . Frequency of Communication with Friends and Family: Not on file  . Frequency of Social Gatherings with Friends and Family: Not on file  . Attends Religious Services: Not on file  . Active Member of Clubs or Organizations: Not on file  . Attends Banker Meetings: Not on file  . Marital Status: Not on file     Family History: The patient's family history is negative for Breast cancer. She was adopted.  ROS:   All other ROS reviewed and negative. Pertinent positives noted in the HPI.     EKGs/Labs/Other Studies Reviewed:   The following studies were personally reviewed by me today:  EKG:  EKG is ordered today.  The ekg ordered today demonstrates normal sinus rhythm, heart rate 89, no acute ischemic changes, no evidence of prior infarct, and was personally reviewed by me.   Recent Labs: 12/31/2019: ALT 19; BUN 6; Creatinine, Ser 0.89; Hemoglobin 14.4; Platelets 262; Potassium 4.2; Sodium 138; TSH 0.924   Recent Lipid Panel    Component Value Date/Time   CHOL 177 12/31/2019 1043   TRIG 56 12/31/2019 1043   HDL 65 12/31/2019 1043   CHOLHDL 2.7 12/31/2019  1043   CHOLHDL 2.7 08/22/2016 1647   VLDL 11 08/22/2016 1647   LDLCALC 101 (H) 12/31/2019 1043    Physical Exam:   VS:  BP 120/78   Pulse 89   Temp (!) 96.8 F (36 C)   Ht 5\' 4"  (1.626 m)   Wt 177 lb 9.6 oz (80.6 kg)   LMP  (LMP Unknown)  SpO2 99%   BMI 30.48 kg/m    Wt Readings from Last 3 Encounters:  03/06/20 177 lb 9.6 oz (80.6 kg)  02/11/20 177 lb (80.3 kg)  01/07/20 180 lb 12.8 oz (82 kg)    General: Well nourished, well developed, in no acute distress Heart: Atraumatic, normal size  Eyes: PEERLA, EOMI  Neck: Supple, no JVD Endocrine: No thryomegaly Cardiac: Normal S1, S2; RRR; no murmurs, rubs, or gallops Lungs: Clear to auscultation bilaterally, no wheezing, rhonchi or rales  Abd: Soft, nontender, no hepatomegaly  Ext: No edema, pulses 2+ Musculoskeletal: No deformities, BUE and BLE strength normal and equal Skin: Warm and dry, no rashes   Neuro: Alert and oriented to person, place, time, and situation, CNII-XII grossly intact, no focal deficits  Psych: Normal mood and affect   ASSESSMENT:   Carrie Gordon is a 49 y.o. female who presents for the following: 1. Palpitations   2. Chest pain, unspecified type   3. Primary hypertension   4. Precordial pain     PLAN:   1. Palpitations -Intermittent episodes of tightness in her chest and fluttering.  EKG is normal.  Cardiovascular exam is normal.  Recent TSH is 0.92.  We did discuss her symptoms could be related to menopause.  She did have a hysterectomy but this was partial.  Given the warmth she describes could be related to that.  We do need to exclude arrhythmia.  We will proceed with a 7-day Zio patch.  2. Chest pain, unspecified type -She also describes intermittent tightness in her chest.  EKG is normal without evidence of infarction or ischemia.  The tightness can occur with exertion but can also occur at rest.  Also associated with palpitations as above.  Given her atypical features but does  have some typical features I recommended a coronary CTA for further evaluation.  We will check her kidney function today which was normal several weeks ago.  We will give her 100 mg of metoprolol tartrate and proceed with coronary CTA.  I will see her back in 3 months after this evaluation.  3. Primary hypertension -Also recently diagnosed with high blood pressure.  Well-controlled on amlodipine.  Further evaluation of palpitations and chest tightness as above.  Disposition: Return in about 3 months (around 06/06/2020).  Medication Adjustments/Labs and Tests Ordered: Current medicines are reviewed at length with the patient today.  Concerns regarding medicines are outlined above.  Orders Placed This Encounter  Procedures  . CT CORONARY MORPH W/CTA COR W/SCORE W/CA W/CM &/OR WO/CM  . CT CORONARY FRACTIONAL FLOW RESERVE DATA PREP  . CT CORONARY FRACTIONAL FLOW RESERVE FLUID ANALYSIS  . Basic metabolic panel  . Basic metabolic panel  . LONG TERM MONITOR (3-14 DAYS)  . EKG 12-Lead   Meds ordered this encounter  Medications  . metoprolol tartrate (LOPRESSOR) 50 MG tablet    Sig: Take 2 tablets (100 mg total) by mouth once for 1 dose. TAKE TWO HOURS PRIOR TO  SCHEDULE CARDIAC TEST    Dispense:  2 tablet    Refill:  0    Patient Instructions  Medication Instructions:   SEE INSTRUCTION  FOR CCTA *If you need a refill on your cardiac medications before your next appointment, please call your pharmacy*   Lab Work: BMP - TODAY    SEE INSTRUCTION  FOR CCTA If you have labs (blood work) drawn today and your tests are completely normal, you will receive your results only by: Marland Kitchen MyChart Message (  if you have MyChart) OR . A paper copy in the mail If you have any lab test that is abnormal or we need to change your treatment, we will call you to review the results.   Testing/Procedures:  Your physician has recommended that you wear a  7 DAY ZIO-PATCH monitor. The Zio patch cardiac monitor  continuously records heart rhythm data, this is for patients being evaluated for multiple types heart rhythms. For the first 24 hours post application, please avoid getting the Zio monitor wet in the shower or by excessive sweating during exercise. After that, feel free to carry on with regular activities. Keep soaps and lotions away from the ZIO XT Patch.  Someone will be calling you to let you know when this is mailed.  This will be mailed to you, please expect 7-10 days to receive.      Call [facility] Customer Care at [phone] if you have questions regarding your ZIO XT patch monitor.  Call them immediately if you see an orange light blinking on your monitor.   If your monitor falls off in less than 4 days contact our Monitor department at [phone].  If your monitor becomes loose or falls off after 4 days call [employer] at [phone] for suggestions on securing your monitor   AND    WILL BE SCHEDULE AT [place]- RADIOLOGY [address]  -  ONCE INSURANCE AUTHORIZATION IS APPROVED  Your physician has requested that you have cardiac CTA. Cardiac computed tomography (CT) is a painless test that uses an x-ray machine to take clear, detailed pictures of your heart. . Please follow instruction sheet as given.    Follow-Up: At [facility], you and your health needs are our priority.  As part of our continuing mission to provide you with exceptional heart care, we have created designated Provider Care Teams.  These Care Teams include your primary Cardiologist (physician) and Advanced Practice Providers (APPs -  Physician Assistants and Nurse Practitioners) who all work together to provide you with the care you need, when you need it.  We recommend signing up for the patient portal called "MyChart".  Sign up information is provided on this After Visit Summary.  MyChart is used to connect with patients for Virtual Visits (Telemedicine).  Patients are able to view  lab/test results, encounter notes, upcoming appointments, etc.  Non-urgent messages can be sent to your provider as well.   To learn more about what you can do with MyChart, go to [email or web address].    Your next appointment:   3 month(s)  The format for your next appointment:   In Person  Provider:   Lennie Odor, MD   Other Instructions  SEE INSTRUCTION  FOR CCTA   Your cardiac CT will be scheduled at  the below location:   [facility] [address] [place], [place] [address] [phone]    If scheduled at [facility], please arrive at the [facility] main entrance of [facility] 30 minutes prior to test start time. Proceed to the [facility] Radiology Department (first floor) to check-in and test prep.    Please follow these instructions carefully (unless otherwise directed):   PLEASE HAVE LAB -BMP COMPLETED ONE WEEK PRIOR TO TESTING   On the Night Before the Test: . Be sure to Drink plenty of water. . Do not consume any caffeinated/decaffeinated beverages or chocolate 12 hours prior to your test. . Do not take any antihistamines 12 hours prior  to your test.  On the Day of the Test: . Drink plenty of water. Do not drink any water within one hour of the test. . Do not eat any food 4 hours prior to the test. . You may take your regular medications prior to the test.  . Take metoprolol (Lopressor) 100 MG  two hours prior to test. FEMALES- please wear underwire-free bra if available       After the Test: . Drink plenty of water. . After receiving IV contrast, you may experience a mild flushed feeling. This is normal. . On occasion, you may experience a mild rash up to 24 hours after the test. This is not dangerous. If this occurs, you can take Benadryl 25 mg and increase your fluid intake. . If you experience trouble breathing, this can be serious. If it is severe call 911 IMMEDIATELY. If it is mild, please call our  office.    Once we have confirmed authorization from your insurance company, we will call you to set up a date and time for your test. Based on how quickly your insurance processes prior authorizations requests, please allow up to 4 weeks to be contacted for scheduling your Cardiac CT appointment. Be advised that routine Cardiac CT appointments could be scheduled as many as 8 weeks after your provider has ordered it.  For non-scheduling related questions, please contact the cardiac imaging nurse navigator should you have any questions/concerns: Rockwell Alexandria, Cardiac Imaging Nurse Navigator Mitzi Hansen, Interim Cardiac Imaging Nurse Navigator [facility] Direct Office Dial: [phone]   For scheduling needs, including cancellations and rescheduling, please call Sheralyn Boatman at [phone], option 3.   ZIO XT- Long Term Monitor Instructions   Your physician has requested you wear your ZIO patch monitor__7_____days.   This is a single patch monitor.  [employer] supplies one patch monitor per enrollment.  Additional stickers are not available.   Please do not apply patch if you will be having a Nuclear Stress Test, Echocardiogram, Cardiac CT, MRI, or Chest Xray during the time frame you would be wearing the monitor. The patch cannot be worn during these tests.  You cannot remove and re-apply the ZIO XT patch monitor.   Your ZIO patch monitor will be sent USPS Priority mail from [facility] directly to your home address. The monitor may also be mailed to a PO BOX if home delivery is not available.   It may take 3-5 days to receive your monitor after you have been enrolled.   Once you have received you monitor, please review enclosed instructions.  Your monitor has already been registered assigning a specific monitor serial # to you.   Applying the monitor   Shave hair from upper left chest.   Hold abrader disc by orange tab.  Rub abrader in 40 strokes over left  upper chest as indicated in your monitor instructions.   Clean area with 4 enclosed alcohol pads .  Use all pads to assure are is cleaned thoroughly.  Let dry.   Apply patch as indicated in monitor instructions.  Patch will be place under collarbone on left side of chest with arrow pointing upward.   Rub patch adhesive wings for 2 minutes.Remove white label marked "1".  Remove white label marked "2".  Rub patch adhesive wings for 2 additional minutes.   While looking in a mirror, press and release button in center of patch.  A small green light will flash 3-4 times .  This  will be your only indicator the monitor has been turned on.     Do not shower for the first 24 hours.  You may shower after the first 24 hours.   Press button if you feel a symptom. You will hear a small click.  Record Date, Time and Symptom in the Patient Log Book.   When you are ready to remove patch, follow instructions on last 2 pages of Patient Log Book.  Stick patch monitor onto last page of Patient Log Book.   Place Patient Log Book in [place] box.  Use locking tab on box and tape box closed securely.  The [employer] and [employer] has [employer] on it.  Please place in mailbox as soon as possible.  Your physician should have your test results approximately 7 days after the monitor has been mailed back to [facility].   Call [facility] Customer Care at [phone] if you have questions regarding your ZIO XT patch monitor.  Call them immediately if you see an orange light blinking on your monitor.   If your monitor falls off in less than 4 days contact our Monitor department at [phone].  If your monitor becomes loose or falls off after 4 days call [employer] at [phone] for suggestions on securing your monitor.         Signed, Lenna Gilford. Flora Lipps, MD [facility]  [address], [address] [place], [place] [address] [phone]  03/06/2020 9:25 AM

## 2020-03-06 ENCOUNTER — Other Ambulatory Visit: Payer: Self-pay

## 2020-03-06 ENCOUNTER — Ambulatory Visit (INDEPENDENT_AMBULATORY_CARE_PROVIDER_SITE_OTHER): Payer: No Typology Code available for payment source | Admitting: Cardiovascular Disease

## 2020-03-06 ENCOUNTER — Encounter: Payer: Self-pay | Admitting: *Deleted

## 2020-03-06 ENCOUNTER — Encounter: Payer: Self-pay | Admitting: Cardiovascular Disease

## 2020-03-06 VITALS — BP 120/78 | HR 89 | Temp 96.8°F | Ht 64.0 in | Wt 177.6 lb

## 2020-03-06 DIAGNOSIS — I10 Essential (primary) hypertension: Secondary | ICD-10-CM

## 2020-03-06 DIAGNOSIS — R072 Precordial pain: Secondary | ICD-10-CM | POA: Diagnosis not present

## 2020-03-06 DIAGNOSIS — R079 Chest pain, unspecified: Secondary | ICD-10-CM

## 2020-03-06 DIAGNOSIS — R002 Palpitations: Secondary | ICD-10-CM

## 2020-03-06 LAB — BASIC METABOLIC PANEL
BUN/Creatinine Ratio: 11 (ref 9–23)
BUN: 9 mg/dL (ref 6–24)
CO2: 23 mmol/L (ref 20–29)
Calcium: 9.5 mg/dL (ref 8.7–10.2)
Chloride: 102 mmol/L (ref 96–106)
Creatinine, Ser: 0.85 mg/dL (ref 0.57–1.00)
GFR calc Af Amer: 92 mL/min/{1.73_m2} (ref >59)
GFR calc non Af Amer: 80 mL/min/{1.73_m2} (ref >59)
Glucose: 92 mg/dL (ref 65–99)
Potassium: 4.5 mmol/L (ref 3.5–5.2)
Sodium: 140 mmol/L (ref 134–144)

## 2020-03-06 MED ORDER — METOPROLOL TARTRATE 50 MG PO TABS: 100.0000 mg | ORAL_TABLET | Freq: Once | ORAL | 0 refills | Status: DC

## 2020-03-06 NOTE — Progress Notes (Signed)
Patient ID: Carrie Gordon, female   DOB: Jan 29, 1971, 49 y.o.   MRN: [id number] Patient enrolled for [employer] to ship a7 day ZIO XT long term holter monitor to her home.

## 2020-03-06 NOTE — Patient Instructions (Addendum)
Medication Instructions:   SEE INSTRUCTION  FOR CCTA *If you need a refill on your cardiac medications before your next appointment, please call your pharmacy*   Lab Work: BMP - TODAY    SEE INSTRUCTION  FOR CCTA If you have labs (blood work) drawn today and your tests are completely normal, you will receive your results only by: Marland Kitchen MyChart Message (if you have MyChart) OR . A paper copy in the mail If you have any lab test that is abnormal or we need to change your treatment, we will call you to review the results.   Testing/Procedures:  Your physician has recommended that you wear a  7 DAY ZIO-PATCH monitor. The Zio patch cardiac monitor continuously records heart rhythm data, this is for patients being evaluated for multiple types heart rhythms. For the first 24 hours post application, please avoid getting the Zio monitor wet in the shower or by excessive sweating during exercise. After that, feel free to carry on with regular activities. Keep soaps and lotions away from the ZIO XT Patch.  Someone will be calling you to let you know when this is mailed.  This will be mailed to you, please expect 7-10 days to receive.      Call [place] at [phone] if you have questions regarding your ZIO XT patch monitor.  Call them immediately if you see an orange light blinking on your monitor.   If your monitor falls off in less than 4 days contact our Monitor department at [phone].  If your monitor becomes loose or falls off after 4 days call [employer] at [phone] for suggestions on securing your monitor   AND    WILL BE SCHEDULE AT [facility] physician has requested that you have cardiac CTA. Cardiac computed tomography (CT) is a painless test that uses an x-ray machine to take clear, detailed pictures of your heart. . Please follow instruction sheet as  given.    Follow-Up: At [facility], you and your health needs are our priority.  As part of our continuing mission to provide you with exceptional heart care, we have created designated Provider Care Teams.  These Care Teams include your primary Cardiologist (physician) and Advanced Practice Providers (APPs -  Physician Assistants and Nurse Practitioners) who all work together to provide you with the care you need, when you need it.  We recommend signing up for the patient portal called "MyChart".  Sign up information is provided on this After Visit Summary.  MyChart is used to connect with patients for Virtual Visits (Telemedicine).  Patients are able to view lab/test results, encounter notes, upcoming appointments, etc.  Non-urgent messages can be sent to your provider as well.   To learn more about what you can do with MyChart, go to [email or web address].    Your next appointment:   3 month(s)  The format for your next appointment:   In Person  Provider:   Eleonore Chiquito, MD   Other Instructions  SEE INSTRUCTION  FOR CCTA   Your cardiac CT will be scheduled at  the below location:   [facility] [address] [place], [place] [address] [phone]    If scheduled at [facility], please arrive at the [facility] main entrance of [facility] 30 minutes prior to test start time. Proceed to the [facility] Radiology Department (first floor) to check-in and test  prep.    Please follow these instructions carefully (unless otherwise directed):   PLEASE HAVE LAB -BMP COMPLETED ONE WEEK PRIOR TO TESTING   On the Night Before the Test: . Be sure to Drink plenty of water. . Do not consume any caffeinated/decaffeinated beverages or chocolate 12 hours prior to your test. . Do not take any antihistamines 12 hours prior to your test.  On the Day of the Test: . Drink plenty of water. Do not drink any water within one hour of the test. . Do not  eat any food 4 hours prior to the test. . You may take your regular medications prior to the test.  . Take metoprolol (Lopressor) 100 MG  two hours prior to test. FEMALES- please wear underwire-free bra if available       After the Test: . Drink plenty of water. . After receiving IV contrast, you may experience a mild flushed feeling. This is normal. . On occasion, you may experience a mild rash up to 24 hours after the test. This is not dangerous. If this occurs, you can take Benadryl 25 mg and increase your fluid intake. . If you experience trouble breathing, this can be serious. If it is severe call 911 IMMEDIATELY. If it is mild, please call our office.    Once we have confirmed authorization from your insurance company, we will call you to set up a date and time for your test. Based on how quickly your insurance processes prior authorizations requests, please allow up to 4 weeks to be contacted for scheduling your Cardiac CT appointment. Be advised that routine Cardiac CT appointments could be scheduled as many as 8 weeks after your provider has ordered it.  For non-scheduling related questions, please contact the cardiac imaging nurse navigator should you have any questions/concerns: Marchia Bond, Cardiac Imaging Nurse Navigator Burley Saver, Interim Cardiac Imaging Nurse [place] [facility] Direct Office Dial: [phone]   For scheduling needs, including cancellations and rescheduling, please call Vivien Rota at [phone], option 3.   ZIO XT- Long Term Monitor Instructions   Your physician has requested you wear your ZIO patch monitor__7_____days.   This is a single patch monitor.  [employer] supplies one patch monitor per enrollment.  Additional stickers are not available.   Please do not apply patch if you will be having a Nuclear Stress Test, Echocardiogram, Cardiac CT, MRI, or Chest Xray during the time frame you would be wearing the monitor. The patch  cannot be worn during these tests.  You cannot remove and re-apply the ZIO XT patch monitor.   Your ZIO patch monitor will be sent USPS Priority mail from [facility] directly to your home address. The monitor may also be mailed to a PO BOX if home delivery is not available.   It may take 3-5 days to receive your monitor after you have been enrolled.   Once you have received you monitor, please review enclosed instructions.  Your monitor has already been registered assigning a specific monitor serial # to you.   Applying the monitor   Shave hair from upper left chest.   Hold abrader disc by orange tab.  Rub abrader in 40 strokes over left upper chest as indicated in your monitor instructions.   Clean area with 4 enclosed alcohol pads .  Use all pads to assure are is cleaned thoroughly.  Let dry.   Apply patch as indicated in monitor instructions.  Patch will be place under collarbone  on left side of chest with arrow pointing upward.   Rub patch adhesive wings for 2 minutes.Remove white label marked "1".  Remove white label marked "2".  Rub patch adhesive wings for 2 additional minutes.   While looking in a mirror, press and release button in center of patch.  A small green light will flash 3-4 times .  This will be your only indicator the monitor has been turned on.     Do not shower for the first 24 hours.  You may shower after the first 24 hours.   Press button if you feel a symptom. You will hear a small click.  Record Date, Time and Symptom in the Patient Log Book.   When you are ready to remove patch, follow instructions on last 2 pages of Patient Log Book.  Stick patch monitor onto last page of Patient Log Book.   Place Patient Log Book in [place] box.  Use locking tab on box and tape box closed securely.  The [employer] has [employer] on it.  Please place in mailbox as soon as possible.  Your physician should have your test results approximately 7 days after the  monitor has been mailed back to [facility].   Call [place] at [phone] if you have questions regarding your ZIO XT patch monitor.  Call them immediately if you see an orange light blinking on your monitor.   If your monitor falls off in less than 4 days contact our Monitor department at [phone].  If your monitor becomes loose or falls off after 4 days call [employer] at [phone] for suggestions on securing your monitor.

## 2020-03-08 ENCOUNTER — Other Ambulatory Visit (INDEPENDENT_AMBULATORY_CARE_PROVIDER_SITE_OTHER): Payer: No Typology Code available for payment source

## 2020-03-08 DIAGNOSIS — R002 Palpitations: Secondary | ICD-10-CM

## 2020-03-08 DIAGNOSIS — R072 Precordial pain: Secondary | ICD-10-CM

## 2020-03-09 ENCOUNTER — Ambulatory Visit: Payer: No Typology Code available for payment source | Admitting: Family Medicine

## 2020-03-14 ENCOUNTER — Ambulatory Visit (INDEPENDENT_AMBULATORY_CARE_PROVIDER_SITE_OTHER): Payer: No Typology Code available for payment source | Admitting: Family Medicine

## 2020-03-14 ENCOUNTER — Encounter: Payer: Self-pay | Admitting: Family Medicine

## 2020-03-14 ENCOUNTER — Ambulatory Visit: Payer: No Typology Code available for payment source | Admitting: Family Medicine

## 2020-03-14 VITALS — BP 114/82 | Temp 97.2°F | Ht 64.0 in | Wt 175.2 lb

## 2020-03-14 DIAGNOSIS — R002 Palpitations: Secondary | ICD-10-CM | POA: Diagnosis not present

## 2020-03-14 DIAGNOSIS — I10 Essential (primary) hypertension: Secondary | ICD-10-CM

## 2020-03-14 NOTE — Progress Notes (Signed)
Subjective:    Patient ID: Carrie Gordon, female    DOB: 1971/03/28, 49 y.o.   MRN: [id number]  HPI  Patient arrives for a follow up on blood pressure. Taking meds Watching diet Had some axiousness with having a spell of palpitation Denies set backs Seeing cardio for palpitation and to have CTA Review of Systems  Constitutional: Negative for activity change, fatigue and fever.  HENT: Negative for congestion and rhinorrhea.   Respiratory: Negative for cough, chest tightness and shortness of breath.   Cardiovascular: Positive for palpitations. Negative for chest pain and leg swelling.  Gastrointestinal: Negative for abdominal pain and nausea.  Skin: Negative for color change.  Neurological: Negative for dizziness and headaches.  Psychiatric/Behavioral: Negative for agitation and behavioral problems.       Objective:   Physical Exam Vitals reviewed.  Constitutional:      General: She is not in acute distress. HENT:     Head: Normocephalic and atraumatic.  Eyes:     General:        Right eye: No discharge.        Left eye: No discharge.  Neck:     Trachea: No tracheal deviation.  Cardiovascular:     Rate and Rhythm: Normal rate and regular rhythm.     Heart sounds: Normal heart sounds. No murmur heard.   Pulmonary:     Effort: Pulmonary effort is normal. No respiratory distress.     Breath sounds: Normal breath sounds.  Lymphadenopathy:     Cervical: No cervical adenopathy.  Skin:    General: Skin is warm and dry.  Neurological:     Mental Status: She is alert.     Coordination: Coordination normal.  Psychiatric:        Behavior: Behavior normal.           Assessment & Plan:  1. Primary hypertension Good reading keep as is Watch diet Walking if cleared by cardiology  2. Palpitation On 7 monitor If monitor is normal and ongoing sx then conside cardio to do 30 day monitor  Pt to send [place] updates

## 2020-03-16 ENCOUNTER — Telehealth (HOSPITAL_COMMUNITY): Payer: Self-pay | Admitting: Emergency Medicine

## 2020-03-16 NOTE — Telephone Encounter (Signed)
Attempted to call patient regarding upcoming cardiac CT appointment. °Left message on voicemail with name and callback number °Bhavin Monjaraz RN Navigator Cardiac Imaging °[facility] °[phone] Office °[phone] Cell ° °

## 2020-03-17 ENCOUNTER — Telehealth (HOSPITAL_COMMUNITY): Payer: Self-pay | Admitting: Emergency Medicine

## 2020-03-17 NOTE — Telephone Encounter (Signed)
Reaching out to patient to offer assistance regarding upcoming cardiac imaging study; pt verbalizes understanding of appt date/time, parking situation and where to check in, pre-test NPO status and medications ordered, and verified current allergies; name and call back number provided for further questions should they arise Carrie Bond RN Navigator Cardiac Imaging [facility] [phone] office [phone] cell   Pt states getting covid booster vaccine this evening. Will drink plenty of water to stay hydrated. Will take 100mg  metoprolol 2 hr prior to scan Carrie Gordon

## 2020-03-20 ENCOUNTER — Ambulatory Visit (HOSPITAL_COMMUNITY)
Admission: RE | Admit: 2020-03-20 | Discharge: 2020-03-20 | Disposition: A | Discharge status: Home / Self Care | Payer: No Typology Code available for payment source | Source: Ambulatory Visit | Attending: Cardiovascular Disease | Admitting: Cardiovascular Disease

## 2020-03-20 ENCOUNTER — Encounter (HOSPITAL_COMMUNITY): Payer: Self-pay

## 2020-03-20 ENCOUNTER — Encounter: Payer: Self-pay | Admitting: Family Medicine

## 2020-03-20 ENCOUNTER — Other Ambulatory Visit: Payer: Self-pay

## 2020-03-20 DIAGNOSIS — R079 Chest pain, unspecified: Secondary | ICD-10-CM | POA: Diagnosis not present

## 2020-03-20 DIAGNOSIS — R002 Palpitations: Secondary | ICD-10-CM

## 2020-03-20 DIAGNOSIS — R072 Precordial pain: Secondary | ICD-10-CM

## 2020-03-20 MED ORDER — IOHEXOL 350 MG/ML SOLN
80.0000 mL | Freq: Once | INTRAVENOUS | Status: AC | PRN
  Administered 2020-03-20: 80 mL via INTRAVENOUS

## 2020-03-20 MED ORDER — NITROGLYCERIN 0.4 MG SL SUBL
SUBLINGUAL_TABLET | SUBLINGUAL | Status: AC
  Administered 2020-03-20: 0.8 mg via SUBLINGUAL
  Filled 2020-03-20: qty 2

## 2020-03-20 MED ORDER — NITROGLYCERIN 0.4 MG SL SUBL: 0.8000 mg | SUBLINGUAL_TABLET | Freq: Once | SUBLINGUAL | Status: AC

## 2020-03-24 ENCOUNTER — Other Ambulatory Visit: Payer: Self-pay | Admitting: Obstetrics and Gynecology

## 2020-03-24 DIAGNOSIS — Z1231 Encounter for screening mammogram for malignant neoplasm of breast: Secondary | ICD-10-CM

## 2020-05-04 ENCOUNTER — Other Ambulatory Visit: Payer: Self-pay

## 2020-05-04 ENCOUNTER — Ambulatory Visit
Admission: RE | Admit: 2020-05-04 | Discharge: 2020-05-04 | Disposition: A | Discharge status: Home / Self Care | Payer: No Typology Code available for payment source | Source: Ambulatory Visit | Attending: Obstetrics and Gynecology | Admitting: Obstetrics and Gynecology

## 2020-05-04 DIAGNOSIS — Z1231 Encounter for screening mammogram for malignant neoplasm of breast: Secondary | ICD-10-CM

## 2020-06-03 NOTE — Progress Notes (Deleted)
Cardiology Office Note:   Date:  06/03/2020  NAME:  Carrie Gordon    MRN: [id number] DOB:  1970/06/13   PCP:  Babs Sciara, MD  Cardiologist:  Reatha Harps, MD  Electrophysiologist:  None   Referring MD: Babs Sciara, MD   No chief complaint on file. ***  History of Present Illness:   Carrie Gordon is a 50 y.o. female with a hx of HTN, anxiety who presents for follow-up of chest pain and palpitations. Had CP and palpitations after being at a festival. Normal CCTA and normal monitor.   CCTA 03/06/2020 -> Normal with 0 CAC.  Zio 03/26/2020 -> Normal  Past Medical History: Past Medical History:  Diagnosis Date  . Anxiety   . Back problem   . Bursitis of shoulder, right   . Depression   . Elevated hemoglobin A1c 2017  . Hypertension   . Low vitamin D level   . Migraines   . Uterine fibroid     Past Surgical History: Past Surgical History:  Procedure Laterality Date  . ABDOMINAL HYSTERECTOMY  2010   TAH--Dr. Edward Jolly  . MYOMECTOMY  2006   -Dr. Edward Jolly  . TUBAL LIGATION  2002   -Dr. Edward Jolly    Current Medications: No outpatient medications have been marked as taking for the 06/06/20 encounter (Appointment) with Sande Rives, MD.   Current Facility-Administered Medications for the 06/06/20 encounter (Appointment) with O'Neal, Ronnald Ramp, MD  Medication  . 0.9 %  sodium chloride infusion     Allergies:    Patient has no known allergies.   Social History: Social History   Socioeconomic History  . Marital status: Married    Spouse name: Not on file  . Number of children: 1  . Years of education: Not on file  . Highest education level: Not on file  Occupational History  . Not on file  Tobacco Use  . Smoking status: Never Smoker  . Smokeless tobacco: Never Used  Vaping Use  . Vaping Use: Never used  Substance and Sexual Activity  . Alcohol use: Yes    Alcohol/week: 1.0 standard drink    Types: 1 Glasses of wine per week   . Drug use: No  . Sexual activity: Yes    Partners: Male    Birth control/protection: Surgical    Comment: TAH  Other Topics Concern  . Not on file  Social History Narrative  . Not on file   Social Determinants of Health   Financial Resource Strain: Not on file  Food Insecurity: Not on file  Transportation Needs: Not on file  Physical Activity: Not on file  Stress: Not on file  Social Connections: Not on file     Family History: The patient's ***family history is negative for Breast cancer. She was adopted.  ROS:   All other ROS reviewed and negative. Pertinent positives noted in the HPI.     EKGs/Labs/Other Studies Reviewed:   The following studies were personally reviewed by me today:  EKG:  EKG is *** ordered today.  The ekg ordered today demonstrates ***, and was personally reviewed by me.   CCTA 03/20/2020 IMPRESSION: 1. Coronary calcium score of 0.  2. Normal coronary origin with left dominance.  3. Normal coronary arteries.  4. 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  RECOMMENDATIONS: 1. No evidence of CAD (0%). Consider non-atherosclerotic causes of chest pain.  Zio 03/26/2020  1. No significant arrhythmia detected to explain symptoms.  2. Rare ectopy.    Recent Labs: 12/31/2019: ALT 19; Hemoglobin 14.4; Platelets 262; TSH 0.924 03/06/2020: BUN 9; Creatinine, Ser 0.85; Potassium 4.5; Sodium 140   Recent Lipid Panel    Component Value Date/Time   CHOL 177 12/31/2019 1043   TRIG 56 12/31/2019 1043   HDL 65 12/31/2019 1043   CHOLHDL 2.7 12/31/2019 1043   CHOLHDL 2.7 08/22/2016 1647   VLDL 11 08/22/2016 1647   LDLCALC 101 (H) 12/31/2019 1043    Physical Exam:   VS:  LMP  (LMP Unknown)    Wt Readings from Last 3 Encounters:  03/14/20 175 lb 3.2 oz (79.5 kg)  03/06/20 177 lb 9.6 oz (80.6 kg)  02/11/20 177 lb (80.3 kg)    General: Well nourished, well developed, in no acute distress Head: Atraumatic, normal size  Eyes:  PEERLA, EOMI  Neck: Supple, no JVD Endocrine: No thryomegaly Cardiac: Normal S1, S2; RRR; no murmurs, rubs, or gallops Lungs: Clear to auscultation bilaterally, no wheezing, rhonchi or rales  Abd: Soft, nontender, no hepatomegaly  Ext: No edema, pulses 2+ Musculoskeletal: No deformities, BUE and BLE strength normal and equal Skin: Warm and dry, no rashes   Neuro: Alert and oriented to person, place, time, and situation, CNII-XII grossly intact, no focal deficits  Psych: Normal mood and affect   ASSESSMENT:   Carrie Gordon is a 50 y.o. female who presents for the following: No diagnosis found.  PLAN:   There are no diagnoses linked to this encounter.  Disposition: No follow-ups on file.  Medication Adjustments/Labs and Tests Ordered: Current medicines are reviewed at length with the patient today.  Concerns regarding medicines are outlined above.  No orders of the defined types were placed in this encounter.  No orders of the defined types were placed in this encounter.   There are no Patient Instructions on file for this visit.   Time Spent with Patient: I have spent a total of *** minutes with patient reviewing hospital notes, telemetry, EKGs, labs and examining the patient as well as establishing an assessment and plan that was discussed with the patient.  > 50% of time was spent in direct patient care.  Signed, Lenna Gilford. Flora Lipps, MD [facility]  [address], [address] [place], [place] [address] [phone]  06/03/2020 9:31 AM

## 2020-06-06 ENCOUNTER — Ambulatory Visit: Payer: No Typology Code available for payment source | Admitting: Cardiovascular Disease

## 2020-06-06 DIAGNOSIS — I10 Essential (primary) hypertension: Secondary | ICD-10-CM

## 2020-06-06 DIAGNOSIS — R002 Palpitations: Secondary | ICD-10-CM

## 2020-06-06 DIAGNOSIS — R079 Chest pain, unspecified: Secondary | ICD-10-CM

## 2020-07-03 ENCOUNTER — Encounter: Payer: Self-pay | Admitting: Family Medicine

## 2020-07-10 ENCOUNTER — Ambulatory Visit: Payer: No Typology Code available for payment source | Admitting: Family Medicine

## 2020-07-10 ENCOUNTER — Encounter: Payer: Self-pay | Admitting: Family Medicine

## 2020-07-10 ENCOUNTER — Other Ambulatory Visit: Payer: Self-pay | Admitting: Family Medicine

## 2020-07-10 ENCOUNTER — Telehealth: Payer: Self-pay

## 2020-07-10 DIAGNOSIS — F418 Other specified anxiety disorders: Secondary | ICD-10-CM

## 2020-07-10 MED ORDER — ALPRAZOLAM 1 MG PO TABS: ORAL_TABLET | ORAL | 1 refills | Status: DC

## 2020-07-10 NOTE — Telephone Encounter (Signed)
Pt.notified

## 2020-07-10 NOTE — Telephone Encounter (Signed)
Pt needs refill on ALPRAZolam Duanne Moron) 1 MG tablet  Reschedule appt till Wed March 23rd [facility] [address]    Pt call back [phone]

## 2020-07-10 NOTE — Telephone Encounter (Signed)
Was signed thank you 

## 2020-07-10 NOTE — Telephone Encounter (Signed)
Refill was sent to the [facility] in [place]

## 2020-07-10 NOTE — Telephone Encounter (Signed)
[facility] [place] [facility]. I pended med if you want to sign again and I called [facility] in [place] and canceled. Pt would like a call back after its sent.

## 2020-08-02 ENCOUNTER — Encounter: Payer: Self-pay | Admitting: Family Medicine

## 2020-08-02 ENCOUNTER — Ambulatory Visit: Payer: No Typology Code available for payment source | Admitting: Family Medicine

## 2020-08-02 ENCOUNTER — Other Ambulatory Visit: Payer: Self-pay

## 2020-08-02 ENCOUNTER — Ambulatory Visit (HOSPITAL_COMMUNITY)
Admission: RE | Admit: 2020-08-02 | Discharge: 2020-08-02 | Disposition: A | Discharge status: Home / Self Care | Payer: No Typology Code available for payment source | Source: Ambulatory Visit | Attending: Family Medicine | Admitting: Family Medicine

## 2020-08-02 VITALS — BP 129/88 | HR 85 | Temp 97.3°F | Ht 64.0 in | Wt 181.0 lb

## 2020-08-02 DIAGNOSIS — F418 Other specified anxiety disorders: Secondary | ICD-10-CM

## 2020-08-02 DIAGNOSIS — R61 Generalized hyperhidrosis: Secondary | ICD-10-CM | POA: Insufficient documentation

## 2020-08-02 DIAGNOSIS — I10 Essential (primary) hypertension: Secondary | ICD-10-CM | POA: Diagnosis not present

## 2020-08-02 MED ORDER — AMLODIPINE BESYLATE 5 MG PO TABS
5.0000 mg | ORAL_TABLET | Freq: Every day | ORAL | 1 refills | Status: DC
Start: 2020-08-02 — End: 2021-01-31

## 2020-08-02 MED ORDER — ALPRAZOLAM 1 MG PO TABS: ORAL_TABLET | ORAL | 5 refills | Status: DC

## 2020-08-02 NOTE — Progress Notes (Signed)
Subjective:    Patient ID: Carrie Gordon, female    DOB: 1970-08-30, 50 y.o.   MRN: [id number]  Hypertension This is a chronic problem. Treatments tried: amlodipine.  Takes a medicine regular basis tries to eat mildly healthy does not do any regular exercise at this point hopes to do more as the weather gets better  Night sweats for the past 2 weeks. Thinks it might be menopause States is been over the past couple weeks breaks out with a sweat or feels hot at night and has to push covers off does not have this during the day.  No weight loss with it.  No unusual fevers or other problems currently  Pt states insurance only pays for 90 day supply and would like xanax and bp med changed to 90 day.  I did explain to her that I do not do 90-day supply on controlled meds   Review of Systems Denies any type of chest tightness pressure pain shortness of breath.  Occasionally finds her self getting stressed or anxious but denies being depressed    Objective:   Physical Exam  Lungs clear heart regular pulse normal respiratory rate is normal extremities no edema skin warm dry blood pressure good control      Assessment & Plan:  Stress and anxiety overall doing fairly well uses Xanax intermittently sometimes less than 1/day does not abuse the medicine.  Does need refills.  I did inform her that we will not be doing 90-day refills.  Blood pressure good control continue current measures.  Watch diet closely stay physically active.  Significant insomnia issues find yourself going to sleep and several hours later waking up and just laying there we talked about behavioral ways of approaching this and avoiding medication use for sleep also discussed doing sleep evaluation and sleep study but she is not committed at this point Recheck in 6 months Night sweats I doubt lymphoma but we will need to do a chest x-ray lab work await results.  Patient will send [place] an update in 3 to 4 weeks how  things are going.  Obviously if this persists or gets worse we will need further work-up.  Find no evidence of cancer currently. Could well be hormonal she will be seeing her gynecologist soon

## 2020-08-03 LAB — CBC WITH DIFFERENTIAL/PLATELET
Basophils Absolute: 0 10*3/uL (ref 0.0–0.2)
Basos: 1 %
EOS (ABSOLUTE): 0.3 10*3/uL (ref 0.0–0.4)
Eos: 5 %
Hematocrit: 41.6 % (ref 34.0–46.6)
Hemoglobin: 13.9 g/dL (ref 11.1–15.9)
Immature Grans (Abs): 0 10*3/uL (ref 0.0–0.1)
Immature Granulocytes: 0 %
Lymphocytes Absolute: 2.2 10*3/uL (ref 0.7–3.1)
Lymphs: 34 %
MCH: 30.1 pg (ref 26.6–33.0)
MCHC: 33.4 g/dL (ref 31.5–35.7)
MCV: 90 fL (ref 79–97)
Monocytes Absolute: 0.5 10*3/uL (ref 0.1–0.9)
Monocytes: 8 %
Neutrophils Absolute: 3.4 10*3/uL (ref 1.4–7.0)
Neutrophils: 52 %
Platelets: 238 10*3/uL (ref 150–450)
RBC: 4.62 x10E6/uL (ref 3.77–5.28)
RDW: 12.8 % (ref 11.7–15.4)
WBC: 6.4 10*3/uL (ref 3.4–10.8)

## 2020-08-03 LAB — COMPREHENSIVE METABOLIC PANEL
ALT: 23 IU/L (ref 0–32)
AST: 23 IU/L (ref 0–40)
Albumin/Globulin Ratio: 1.3 (ref 1.2–2.2)
Albumin: 4.5 g/dL (ref 3.8–4.9)
Alkaline Phosphatase: 120 IU/L (ref 44–121)
BUN/Creatinine Ratio: 15 (ref 9–23)
BUN: 13 mg/dL (ref 6–24)
Bilirubin Total: 0.7 mg/dL (ref 0.0–1.2)
CO2: 26 mmol/L (ref 20–29)
Calcium: 9.7 mg/dL (ref 8.7–10.2)
Chloride: 100 mmol/L (ref 96–106)
Creatinine, Ser: 0.88 mg/dL (ref 0.57–1.00)
Globulin, Total: 3.5 g/dL (ref 1.5–4.5)
Glucose: 98 mg/dL (ref 65–99)
Potassium: 3.9 mmol/L (ref 3.5–5.2)
Sodium: 140 mmol/L (ref 134–144)
Total Protein: 8 g/dL (ref 6.0–8.5)
eGFR: 80 mL/min/{1.73_m2} (ref >59)

## 2020-08-04 ENCOUNTER — Telehealth: Payer: Self-pay | Admitting: Family Medicine

## 2020-08-04 NOTE — Telephone Encounter (Signed)
Nurse--Patient is return call to nurse.

## 2020-08-04 NOTE — Telephone Encounter (Signed)
Pt replied via [place]

## 2020-08-04 NOTE — Telephone Encounter (Signed)
Pt calling back for result note. Left message to return call and also sent my chart message

## 2020-11-15 ENCOUNTER — Encounter: Payer: Self-pay | Admitting: Obstetrics and Gynecology

## 2020-11-15 ENCOUNTER — Ambulatory Visit (INDEPENDENT_AMBULATORY_CARE_PROVIDER_SITE_OTHER): Payer: No Typology Code available for payment source | Admitting: Obstetrics and Gynecology

## 2020-11-15 ENCOUNTER — Other Ambulatory Visit: Payer: Self-pay

## 2020-11-15 VITALS — BP 118/76 | HR 85 | Ht 64.5 in | Wt 183.0 lb

## 2020-11-15 DIAGNOSIS — Z01419 Encounter for gynecological examination (general) (routine) without abnormal findings: Secondary | ICD-10-CM

## 2020-11-15 NOTE — Progress Notes (Signed)
50 y.o. G53P1001 Married [place] American female here for annual exam.    Now taking blood pressure medication.   Every other month has tenderness in her nipples.  Hot flashes and night sweats every so often.   Followed for low vit D.  Not taking over the counter replacement.  No regular dairy products.   Had her second Covid booster.   PCP: Sallee Lange, MD    No LMP recorded (lmp unknown). Patient has had a hysterectomy.           Sexually active: Yes.    The current method of family planning is status post hysterectomy.    Exercising: No.  The patient does not participate in regular exercise at present. Smoker:  no  Health Maintenance: Pap:  2014 Neg History of abnormal Pap:  no MMG: 05-04-20 3D/Neg/BiRads1 Colonoscopy: 04/28/17 normal f/u 10 years  BMD: n/a Result  n/a TDaP:  2015 Gardasil:   no HIV:Neg in preg Hep C: never Screening Labs:  PCP.    reports that she has never smoked. She has never used smokeless tobacco. She reports current alcohol use of about 1.0 standard drink of alcohol per week. She reports that she does not use drugs.  Past Medical History:  Diagnosis Date   Anxiety    Back problem    Bursitis of shoulder, right    Depression    Elevated hemoglobin A1c 2017   Hypertension    Low vitamin D level    Migraines    Uterine fibroid     Past Surgical History:  Procedure Laterality Date   ABDOMINAL HYSTERECTOMY  2010   TAH--Dr. Quincy Simmonds   MYOMECTOMY  2006   -Dr. Quincy Simmonds   TUBAL LIGATION  2002   -Dr. Quincy Simmonds    Current Outpatient Medications  Medication Sig Dispense Refill   ALPRAZolam (XANAX) 1 MG tablet TAKE ONE-HALF TO 1 TABLET BY MOUTH TWICE DAILY AS NEEDED 30 tablet 5   amLODipine (NORVASC) 5 MG tablet Take 1 tablet (5 mg total) by mouth daily. 90 tablet 1   Current Facility-Administered Medications  Medication Dose Route Frequency Provider Last Rate Last Admin   0.9 %  sodium chloride infusion  500 mL Intravenous Once Doran Stabler, MD        Family History  Adopted: Yes  Problem Relation Age of Onset   Breast cancer Neg Hx     Review of Systems  All other systems reviewed and are negative.  Exam:   BP 118/76 (Cuff Size: Large)   Pulse 85   Ht 5' 4.5" (1.638 m)   Wt 183 lb (83 kg)   LMP  (LMP Unknown)   SpO2 99%   BMI 30.93 kg/m     General appearance: alert, cooperative and appears stated age Head: normocephalic, without obvious abnormality, atraumatic Neck: no adenopathy, supple, symmetrical, trachea midline and thyroid normal to inspection and palpation Lungs: clear to auscultation bilaterally Breasts: normal appearance, no masses or tenderness, No nipple retraction or dimpling, No nipple discharge or bleeding, No axillary adenopathy Heart: regular rate and rhythm Abdomen: soft, non-tender; no masses, no organomegaly Extremities: extremities normal, atraumatic, no cyanosis or edema Skin: skin color, texture, turgor normal. No rashes or lesions Lymph nodes: cervical, supraclavicular, and axillary nodes normal. Neurologic: grossly normal  Pelvic: External genitalia:  no lesions              No abnormal inguinal nodes palpated.  Urethra:  normal appearing urethra with no masses, tenderness or lesions              Bartholins and Skenes: normal                 Vagina: normal appearing vagina with normal color and discharge, no lesions              Cervix: absent              Pap taken: no Bimanual Exam:  Uterus:  absent              Adnexa: no mass, fullness, tenderness              Rectal exam: yes.  Confirms.              Anus:  normal sphincter tone, no lesions  Chaperone was present for exam.  Assessment:   Well woman visit with normal exam. Status post TAH. Remote history of abnormal pap. Hx low vit D.  HTN.  Plan: Mammogram screening discussed. Self breast awareness reviewed. Pap and HR HPV as above. Guidelines for Calcium, Vitamin D, regular exercise program  including cardiovascular and weight bearing exercise. Check vit D.  Follow up annually and prn.    After visit summary provided.

## 2020-11-15 NOTE — Patient Instructions (Signed)

## 2020-11-16 LAB — VITAMIN D 25 HYDROXY (VIT D DEFICIENCY, FRACTURES): Vit D, 25-Hydroxy: 32 ng/mL (ref 30–100)

## 2021-01-31 ENCOUNTER — Other Ambulatory Visit: Payer: Self-pay | Admitting: Family Medicine

## 2021-02-02 ENCOUNTER — Other Ambulatory Visit: Payer: Self-pay | Admitting: Family Medicine

## 2021-02-02 DIAGNOSIS — F418 Other specified anxiety disorders: Secondary | ICD-10-CM

## 2021-02-22 ENCOUNTER — Other Ambulatory Visit (INDEPENDENT_AMBULATORY_CARE_PROVIDER_SITE_OTHER): Payer: No Typology Code available for payment source

## 2021-02-22 ENCOUNTER — Other Ambulatory Visit: Payer: Self-pay

## 2021-02-22 DIAGNOSIS — Z23 Encounter for immunization: Secondary | ICD-10-CM | POA: Diagnosis not present

## 2021-03-19 ENCOUNTER — Other Ambulatory Visit: Payer: Self-pay | Admitting: Family Medicine

## 2021-04-22 IMAGING — CT CT HEART MORP W/ CTA COR W/ SCORE W/ CA W/CM &/OR W/O CM
1 of 2 series · 8 of 20 positions shown, 10 images · non-contrast
Comparison: None.
COMPARISON: None.

Addendum:
EXAM:
OVER-READ INTERPRETATION  CT CHEST

The following report is an over-read performed by radiologist Dr.
Danii Aujla [[email or web address]] on 03/20/2020. This
over-read does not include interpretation of cardiac or coronary
anatomy or pathology. The coronary calcium score/coronary CTA
interpretation by the cardiologist is attached.
CLINICAL DATA: Chest pain
Cardiac/Coronary CTA
TECHNIQUE: The patient was scanned on a [employer] Force scanner. A 100 kV
prospective scan was triggered in the descending thoracic aorta at
111 HU's. Axial non-contrast 3 mm slices were carried out through
the heart. The data set was analyzed on a dedicated work station and
scored using the Agatson method. Gantry rotation speed was 250 msecs
and collimation was .6 mm. No beta blockade and 0.8 mg of sl NTG was
given. The 3D data set was reconstructed in 5% intervals of the
35-75 % of the R-R cycle. Diastolic phases were analyzed on a
dedicated work station using MPR, MIP and VRT modes. The patient
received 80 cc of contrast.

[Series 1117: findings · 0.21mm/px · 8 of 22 slices shown, 10 images]
[im 3/22  vessel]
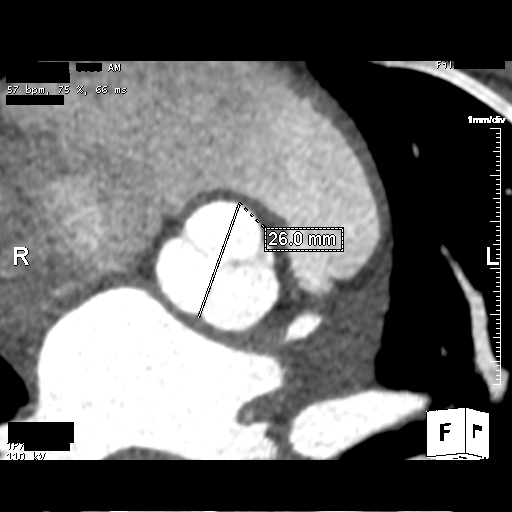
[im 3/22  lung]
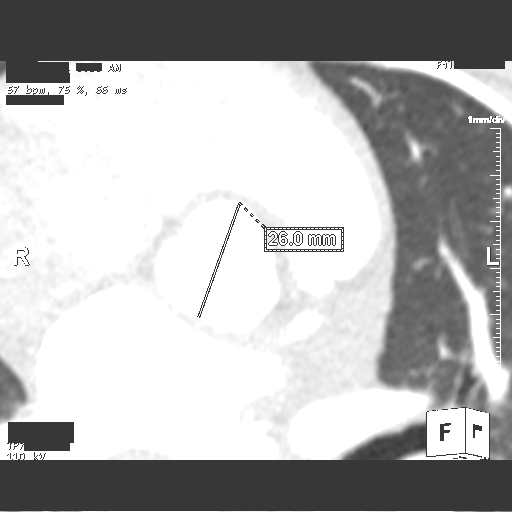
[im 5/22  vessel]
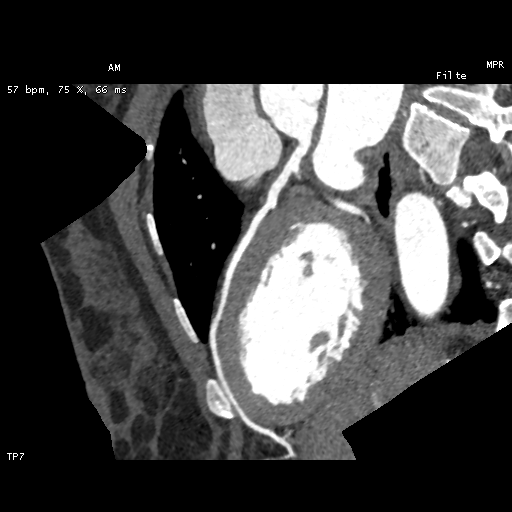
[im 8/22  vessel]
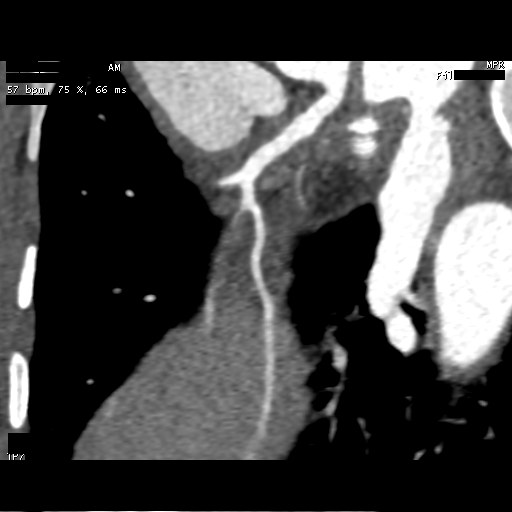
[im 10/22  vessel]
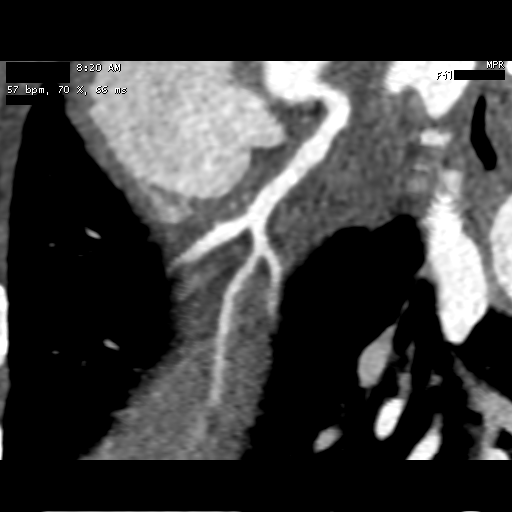
[im 12/22  vessel]
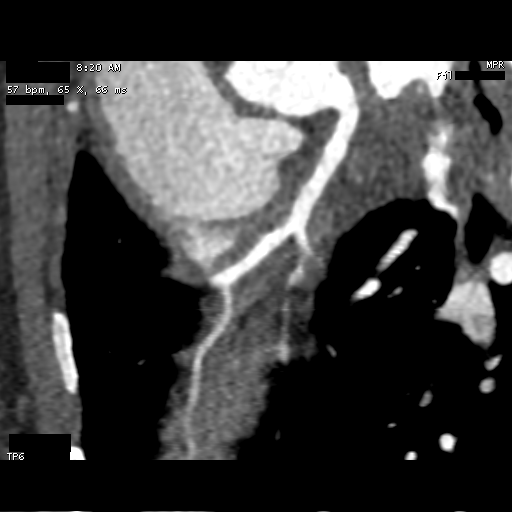
[im 12/22  lung]
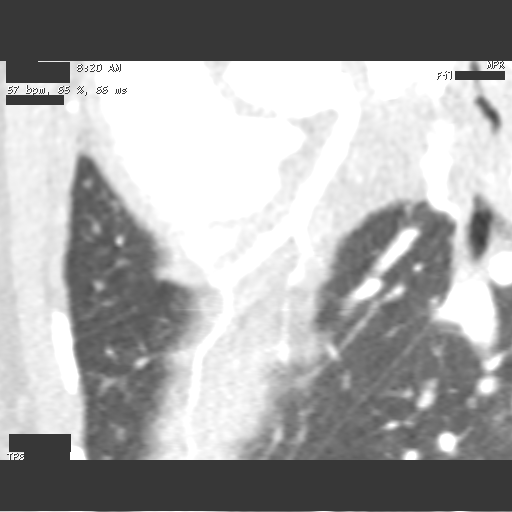
[im 15/22  vessel]
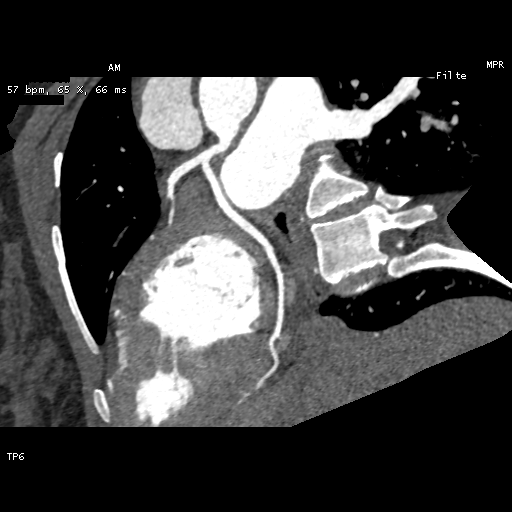
[im 17/22  vessel]
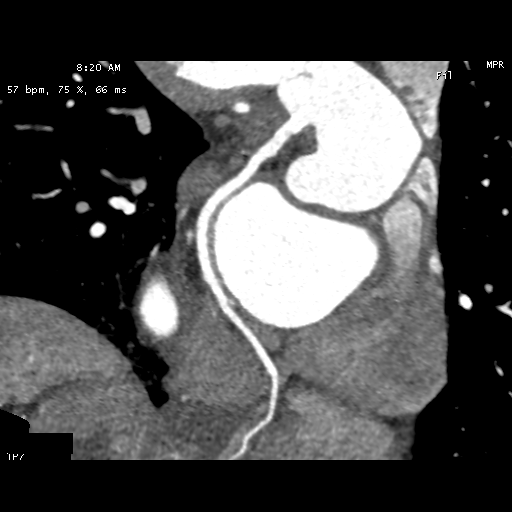
[im 19/22  vessel]
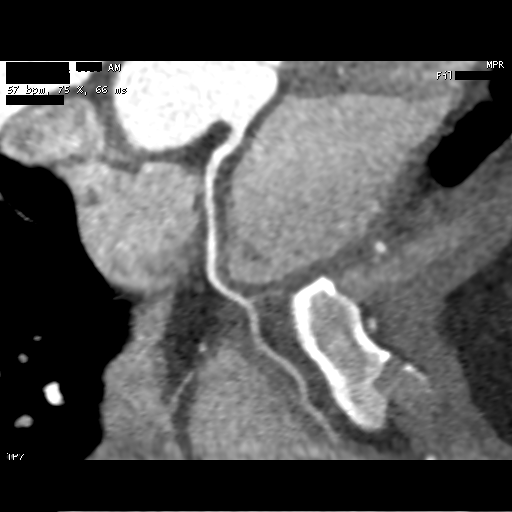

[8 of 20 positions shown; findings below may reference images not displayed]

FINDINGS: Within the visualized portions of the thorax there are no suspicious
appearing pulmonary nodules or masses, there is no acute
consolidative airspace disease, no pleural effusions, no
pneumothorax and no lymphadenopathy. Visualized portions of the
upper abdomen are unremarkable. There are no aggressive appearing
lytic or blastic lesions noted in the visualized portions of the
skeleton.
IMPRESSION: No significant incidental noncardiac findings are noted.
FINDINGS: Image quality: excellent.

Noise artifact is: Limited.

Coronary Arteries:  Normal coronary origin.  Left dominance.

Left main: The left main is a large caliber vessel with a normal
take off from the left coronary cusp that bifurcates to form a left
anterior descending artery and a left circumflex artery. There is no
plaque or stenosis.

Left anterior descending artery: The LAD is patent without evidence
of plaque or stenosis. There is a mid LAD myocardial bridge (normal
variant). The LAD gives off 2 patent diagonal branches.

Left circumflex artery: The LCX is dominant and patent with no
evidence of plaque or stenosis. The LCX gives off 2 patent obtuse
marginal branches. The LCX terminates as a patent PDA.

Right coronary artery: The RCA is non-dominant with normal take off
from the right coronary cusp. There is no evidence of plaque or
stenosis.

Right Atrium: Right atrial size is within normal limits.

Right Ventricle: The right ventricular cavity is within normal
limits.

Left Atrium: Left atrial size is normal in size with no left atrial
appendage filling defect.

Left Ventricle: The ventricular cavity size is within normal limits.
There are no stigmata of prior infarction. There is no abnormal
filling defect.

Pulmonary arteries: Normal in size without proximal filling defect.

Pulmonary veins: Normal pulmonary venous drainage.

Pericardium: Normal thickness with no significant effusion or
calcium present.

Cardiac valves: The aortic valve is trileaflet without significant
calcification. The mitral valve is normal structure without
significant calcification.

Aorta: Normal caliber with no significant disease.

Extra-cardiac findings: See attached radiology report for
non-cardiac structures.
IMPRESSION: 1. Coronary calcium score of 0.

2. Normal coronary origin with left dominance.

3. Normal coronary arteries.

4. There is a mid LAD myocardial bridge (normal variant).

RECOMMENDATIONS:
1. No evidence of CAD (0%). Consider non-atherosclerotic causes of
chest pain.

*** End of Addendum ***
EXAM:
OVER-READ INTERPRETATION  CT CHEST

The following report is an over-read performed by radiologist Dr.
Danii Aujla [REDACTED] on 03/20/2020. This
over-read does not include interpretation of cardiac or coronary
anatomy or pathology. The coronary calcium score/coronary CTA
interpretation by the cardiologist is attached.
FINDINGS: Within the visualized portions of the thorax there are no suspicious
appearing pulmonary nodules or masses, there is no acute
consolidative airspace disease, no pleural effusions, no
pneumothorax and no lymphadenopathy. Visualized portions of the
upper abdomen are unremarkable. There are no aggressive appearing
lytic or blastic lesions noted in the visualized portions of the
skeleton.
IMPRESSION: No significant incidental noncardiac findings are noted.

## 2021-05-02 ENCOUNTER — Telehealth: Payer: Self-pay | Admitting: Family Medicine

## 2021-05-02 NOTE — Telephone Encounter (Signed)
Patient will reschedule next month once she looks at her work schedule but would like a refill on xanax 1 mg into [facility]

## 2021-05-03 ENCOUNTER — Other Ambulatory Visit: Payer: Self-pay | Admitting: Family Medicine

## 2021-05-03 DIAGNOSIS — F418 Other specified anxiety disorders: Secondary | ICD-10-CM

## 2021-05-03 MED ORDER — ALPRAZOLAM 1 MG PO TABS: ORAL_TABLET | ORAL | 1 refills | Status: DC

## 2021-05-03 NOTE — Telephone Encounter (Signed)
FYI refills was sent

## 2021-05-04 ENCOUNTER — Ambulatory Visit: Payer: No Typology Code available for payment source | Admitting: Family Medicine

## 2021-06-10 ENCOUNTER — Emergency Department
Admission: EM | Admit: 2021-06-10 | Discharge: 2021-06-10 | Disposition: A | Discharge status: Home / Self Care | Payer: No Typology Code available for payment source | Attending: Emergency Medicine | Admitting: Emergency Medicine

## 2021-06-10 ENCOUNTER — Emergency Department: Payer: No Typology Code available for payment source

## 2021-06-10 ENCOUNTER — Other Ambulatory Visit: Payer: Self-pay

## 2021-06-10 DIAGNOSIS — K2900 Acute gastritis without bleeding: Secondary | ICD-10-CM | POA: Diagnosis not present

## 2021-06-10 DIAGNOSIS — R1013 Epigastric pain: Secondary | ICD-10-CM

## 2021-06-10 DIAGNOSIS — I10 Essential (primary) hypertension: Secondary | ICD-10-CM | POA: Insufficient documentation

## 2021-06-10 LAB — URINALYSIS, ROUTINE W REFLEX MICROSCOPIC
Glucose, UA: NEGATIVE mg/dL
Leukocytes,Ua: NEGATIVE
Nitrite: NEGATIVE
Protein, ur: 30 mg/dL — AB
Specific Gravity, Urine: 1.025 (ref 1.005–1.030)
pH: 5.5 (ref 5.0–8.0)

## 2021-06-10 LAB — CBC
HCT: 43 % (ref 36.0–46.0)
Hemoglobin: 14.5 g/dL (ref 12.0–15.0)
MCH: 30.7 pg (ref 26.0–34.0)
MCHC: 33.7 g/dL (ref 30.0–36.0)
MCV: 90.9 fL (ref 80.0–100.0)
Platelets: 227 10*3/uL (ref 150–400)
RBC: 4.73 MIL/uL (ref 3.87–5.11)
RDW: 12.7 % (ref 11.5–15.5)
WBC: 8.3 10*3/uL (ref 4.0–10.5)
nRBC: 0 % (ref 0.0–0.2)

## 2021-06-10 LAB — LIPASE, BLOOD: Lipase: 37 U/L (ref 11–51)

## 2021-06-10 LAB — COMPREHENSIVE METABOLIC PANEL
ALT: 42 U/L (ref 0–44)
AST: 38 U/L (ref 15–41)
Albumin: 4.1 g/dL (ref 3.5–5.0)
Alkaline Phosphatase: 103 U/L (ref 38–126)
Anion gap: 8 (ref 5–15)
BUN: 15 mg/dL (ref 6–20)
CO2: 28 mmol/L (ref 22–32)
Calcium: 9 mg/dL (ref 8.9–10.3)
Chloride: 100 mmol/L (ref 98–111)
Creatinine, Ser: 0.99 mg/dL (ref 0.44–1.00)
GFR, Estimated: >60 mL/min (ref >60)
Glucose, Bld: 101 mg/dL — ABNORMAL HIGH (ref 70–99)
Potassium: 3.8 mmol/L (ref 3.5–5.1)
Sodium: 136 mmol/L (ref 135–145)
Total Bilirubin: 1.8 mg/dL — ABNORMAL HIGH (ref 0.3–1.2)
Total Protein: 8 g/dL (ref 6.5–8.1)

## 2021-06-10 LAB — URINALYSIS, MICROSCOPIC (REFLEX): Bacteria, UA: NONE SEEN

## 2021-06-10 MED ORDER — METOCLOPRAMIDE HCL 10 MG PO TABS
10.0000 mg | ORAL_TABLET | Freq: Once | ORAL | Status: AC
  Administered 2021-06-10: 10 mg via ORAL
  Filled 2021-06-10: qty 1

## 2021-06-10 MED ORDER — FAMOTIDINE 20 MG PO TABS
40.0000 mg | ORAL_TABLET | Freq: Once | ORAL | Status: AC
  Administered 2021-06-10: 40 mg via ORAL
  Filled 2021-06-10: qty 2

## 2021-06-10 MED ORDER — SUCRALFATE 1 G PO TABS
1.0000 g | ORAL_TABLET | Freq: Once | ORAL | Status: AC
  Administered 2021-06-10: 1 g via ORAL
  Filled 2021-06-10: qty 1

## 2021-06-10 MED ORDER — FAMOTIDINE 20 MG PO TABS: 20.0000 mg | ORAL_TABLET | Freq: Two times a day (BID) | ORAL | 0 refills | Status: DC

## 2021-06-10 MED ORDER — METOCLOPRAMIDE HCL 10 MG PO TABS: 10.0000 mg | ORAL_TABLET | Freq: Four times a day (QID) | ORAL | 0 refills | Status: DC | PRN

## 2021-06-10 MED ORDER — SUCRALFATE 1 G PO TABS: 1.0000 g | ORAL_TABLET | Freq: Four times a day (QID) | ORAL | 1 refills | Status: DC

## 2021-06-10 NOTE — ED Notes (Signed)
Patient resting on stretcher. RR even and unlabored. Patient states that she started having upper mid abdominal pain since this morning. She denies nausea or vomiting. She does state she had a BM this morning and another loose stool. She took MOM and Immodium which seemed to help. Patient admits that her symptoms may be due to the steak, onion and green pepper sandwich she had last night.

## 2021-06-10 NOTE — ED Notes (Signed)
Discharge instructions, follow-up and scripts reviewed with patient and husband. All questions answered. Patient ambulated out to the front lobby with a steady gait and without assistance.

## 2021-06-10 NOTE — ED Provider Notes (Signed)
[facility] Provider Note    Event Date/Time   First MD Initiated Contact with Patient 06/10/21 2114     (approximate)   History   Abdominal Pain and Diarrhea   HPI  Carrie Gordon is a 51 y.o. female with a history of migraines, anxiety, hypertension, GERD who comes ED complaining of epigastric pain that started about 5:30 AM today.  Constant, waxing waning, no aggravating or alleviating factors, not worsened by eating applesauce today.  Believes it is related to eating a sandwich for dinner that had hot peppers.  Pain is nonradiating, moderate intensity, feels like aching.  No fever chest pain or shortness of breath.  No dizziness.  No vomiting or diarrhea     Physical Exam   Triage Vital Signs: ED Triage Vitals  Enc Vitals Group     BP 06/10/21 1937 (!) 117/95     Pulse Rate 06/10/21 1937 (!) 102     Resp 06/10/21 1937 17     Temp 06/10/21 1937 99 F (37.2 C)     Temp src --      SpO2 06/10/21 1937 98 %     Weight 06/10/21 1938 182 lb 15.7 oz (83 kg)     Height --      Head Circumference --      Peak Flow --      Pain Score 06/10/21 1937 9     Pain Loc --      Pain Edu? --      Excl. in [place]? --     Most recent vital signs: Vitals:   06/10/21 1937  BP: (!) 117/95  Pulse: (!) 102  Resp: 17  Temp: 99 F (37.2 C)  SpO2: 98%     General: Awake, no distress.  CV:  Good peripheral perfusion.  Regular rate and rhythm.  Normal pulses Resp:  Normal effort.  Clear to auscultation bilaterally Abd:  No distention.  Soft.  Moderate left upper quadrant tenderness.  Some mild right upper quadrant tenderness. Other:  No peripheral edema.  Moist oral mucosa.   ED Results / Procedures / Treatments   Labs (all labs ordered are listed, but only abnormal results are displayed) Labs Reviewed  COMPREHENSIVE METABOLIC PANEL - Abnormal; Notable for the following components:      Result Value   Glucose, Bld 101 (*)    Total Bilirubin 1.8  (*)    All other components within normal limits  URINALYSIS, ROUTINE W REFLEX MICROSCOPIC - Abnormal; Notable for the following components:   Hgb urine dipstick MODERATE (*)    Bilirubin Urine SMALL (*)    Ketones, ur TRACE (*)    Protein, ur 30 (*)    All other components within normal limits  LIPASE, BLOOD  CBC  URINALYSIS, MICROSCOPIC (REFLEX)     EKG  Interpreted by me Normal sinus rhythm rate of 98.  Normal axis and intervals.  Poor R wave progression.  Normal ST segments and T waves, no ischemic changes.   RADIOLOGY Ultrasound right upper quadrant viewed and interpreted by me, appears normal, no gallstones.  Radiology report reviewed    PROCEDURES:  Critical Care performed: No  Procedures   MEDICATIONS ORDERED IN ED: Medications  famotidine (PEPCID) tablet 40 mg (40 mg Oral Given 06/10/21 2221)  sucralfate (CARAFATE) tablet 1 g (1 g Oral Given 06/10/21 2221)  metoCLOPramide (REGLAN) tablet 10 mg (10 mg Oral Given 06/10/21 2221)     IMPRESSION / MDM /  ASSESSMENT AND PLAN / ED COURSE  I reviewed the triage vital signs and the nursing notes.                              Differential diagnosis includes, but is not limited to, GERD/gastritis, pancreatitis, cholecystitis, choledocholithiasis, intestinal gas     Patient presents with upper abdominal pain most prominent in the left upper quadrant with reproducible tenderness, most consistent with gastritis.  Vital signs are unremarkable.  Considered admission, however I think this is not indicated because labs are reassuring and ultrasound is negative for cholecystitis or impacted gallstone.  Patient is feeling better after antacids and eager for discharge home and I think she stable to do so.  She will follow-up with primary care.      FINAL CLINICAL IMPRESSION(S) / ED DIAGNOSES   Final diagnoses:  Epigastric pain  Acute gastritis without hemorrhage, unspecified gastritis type     Rx / DC Orders   ED  Discharge Orders          Ordered    famotidine (PEPCID) 20 MG tablet  2 times daily        06/10/21 2244    metoCLOPramide (REGLAN) 10 MG tablet  Every 6 hours PRN        06/10/21 2244    sucralfate (CARAFATE) 1 g tablet  4 times daily        06/10/21 2244             Note:  This document was prepared using Dragon voice recognition software and may include unintentional dictation errors.   Carrie Mew, MD 06/10/21 2249

## 2021-06-10 NOTE — ED Notes (Signed)
Ultrasound tech at bedside to perform exam

## 2021-06-10 NOTE — Discharge Instructions (Addendum)
Your gallbladder ultrasound was normal.  Take antacid medicines to help soothe your stomach, and avoid acidic or spicy foods for the next several days.

## 2021-06-10 NOTE — ED Triage Notes (Signed)
Pt states that at 0530 this morning she developed epigastric pain and diarrhea. Believes it may have been something she ate last night.

## 2021-06-14 ENCOUNTER — Other Ambulatory Visit: Payer: Self-pay | Admitting: Family Medicine

## 2021-06-15 NOTE — Telephone Encounter (Signed)
Sent my chart message 06/15/21

## 2021-06-19 ENCOUNTER — Other Ambulatory Visit: Payer: Self-pay | Admitting: Obstetrics and Gynecology

## 2021-06-19 DIAGNOSIS — Z1231 Encounter for screening mammogram for malignant neoplasm of breast: Secondary | ICD-10-CM

## 2021-06-19 NOTE — Telephone Encounter (Signed)
Has appointment for 07/02/21

## 2021-07-02 ENCOUNTER — Ambulatory Visit
Admission: RE | Admit: 2021-07-02 | Discharge: 2021-07-02 | Disposition: A | Discharge status: Home / Self Care | Payer: No Typology Code available for payment source | Source: Ambulatory Visit | Attending: Obstetrics and Gynecology | Admitting: Obstetrics and Gynecology

## 2021-07-02 ENCOUNTER — Encounter: Payer: Self-pay | Admitting: Family Medicine

## 2021-07-02 ENCOUNTER — Ambulatory Visit: Payer: No Typology Code available for payment source | Admitting: Family Medicine

## 2021-07-02 ENCOUNTER — Other Ambulatory Visit: Payer: Self-pay

## 2021-07-02 VITALS — BP 110/78 | HR 83 | Temp 98.7°F | Wt 182.2 lb

## 2021-07-02 DIAGNOSIS — F419 Anxiety disorder, unspecified: Secondary | ICD-10-CM | POA: Diagnosis not present

## 2021-07-02 DIAGNOSIS — I10 Essential (primary) hypertension: Secondary | ICD-10-CM | POA: Diagnosis not present

## 2021-07-02 DIAGNOSIS — Z1231 Encounter for screening mammogram for malignant neoplasm of breast: Secondary | ICD-10-CM

## 2021-07-02 MED ORDER — AMLODIPINE BESYLATE 5 MG PO TABS: 5.0000 mg | ORAL_TABLET | Freq: Every day | ORAL | 1 refills | Status: DC

## 2021-07-02 MED ORDER — ALPRAZOLAM 1 MG PO TABS: ORAL_TABLET | ORAL | 5 refills | Status: DC

## 2021-07-02 NOTE — Patient Instructions (Signed)

## 2021-07-02 NOTE — Progress Notes (Signed)
Subjective:    Patient ID: Carrie Gordon, female    DOB: [birth year], 51 y.o.   MRN: [id number]  HPI Pt here for med follow up. Pt taking Amlodopine 5 mg daily. Pt went to ER on 06/10/21 for epigastric pain. Pt states pain is better. Blood pressure seems to be doing fine per patient.   Patient for blood pressure check up.  The patient does have hypertension.    Medication compliance-good compliance  Blood pressure control recently-blood pressure under good control currently  Dietary compliance-tries to eat relatively healthy  Recently ER because of epigastric pain and discomfort this is doing much better currently Patient relates doing much better she thinks it was something she ate that triggered this.  Denies any major setbacks. Denies being depressed currently  Review of Systems     Objective:   Physical Exam  General-in no acute distress Eyes-no discharge Lungs-respiratory rate normal, CTA CV-no murmurs,RRR Extremities skin warm dry no edema Neuro grossly normal Behavior normal, alert       Assessment & Plan:  HTN good control continue current measures watch diet stay active  Patient gets yearly wellness through her gynecologic collagen.  They do mammogram and lab work  Anxiety takes half or a whole tablet as necessary to help her with feeling anxious.  Denies abusing it refills given

## 2021-11-12 ENCOUNTER — Encounter: Payer: Self-pay | Admitting: Family Medicine

## 2021-11-12 ENCOUNTER — Telehealth: Payer: Self-pay

## 2021-11-12 ENCOUNTER — Other Ambulatory Visit: Payer: Self-pay | Admitting: Family Medicine

## 2021-11-12 DIAGNOSIS — F419 Anxiety disorder, unspecified: Secondary | ICD-10-CM

## 2021-11-12 MED ORDER — ALPRAZOLAM 1 MG PO TABS: ORAL_TABLET | ORAL | 1 refills | Status: DC

## 2021-11-12 MED ORDER — ESCITALOPRAM OXALATE 5 MG PO TABS: 5.0000 mg | ORAL_TABLET | Freq: Every day | ORAL | 1 refills | Status: DC

## 2021-11-12 NOTE — Telephone Encounter (Signed)
Patient notified of provider's recommendations and verbalized understanding. Patient states she currently only has 2 xanax left and is willing to do a low dose antidepressant and wants to make sure low dose- patient states she took them in the past but has been off of them for 7-8 years. Patient stated she will call back to schedule office visit after he checks her schedule  [facility] [address] in [place]

## 2021-11-12 NOTE — Telephone Encounter (Signed)
I sent in the refill of Xanax maximum 2/day also starting Lexapro low-dose patient to follow-up within the next couple weeks I will send her a MyChart message regarding this message

## 2021-11-12 NOTE — Telephone Encounter (Signed)
1.  I would not recommend Xanax 3 times a day 2.  I would highly recommend that she not exceed 2 Xanax is a work day  1 strategies she could utilize is 1/2 tablet of Xanax every 4-6 hours with a maximum of 2 full tablets used every day We will be important to find out how many tablets she has left, she will needs a new prescription  I also recommend consideration for daily medication typically low-dose Lexapro or Zoloft can help with anxiety and lessen the need for frequent Xanax that way she would have to take the Xanax as often and would be safer  I would recommend that she consider doing an office visit with [place] this week or potentially early next week this can be done as a virtual visit even through [place] as a virtual visit  Please give me feedback regarding her Xanax because more than likely she will need prescription that will temporarily allow for increased amount

## 2021-11-12 NOTE — Telephone Encounter (Signed)
Patient called and states is having a lot of family stress at home and is currently at work and having trouble concentrating due to high emotional reaction to stress, she is currently taking her xanax 3 times daily as needed and is unable to get off work today and would like to see if something can be recommended . Please advise

## 2021-11-21 ENCOUNTER — Ambulatory Visit: Payer: No Typology Code available for payment source | Admitting: Obstetrics and Gynecology

## 2021-12-05 ENCOUNTER — Other Ambulatory Visit: Payer: Self-pay | Admitting: Family Medicine

## 2021-12-05 ENCOUNTER — Ambulatory Visit: Payer: No Typology Code available for payment source | Admitting: Obstetrics and Gynecology

## 2021-12-19 ENCOUNTER — Ambulatory Visit: Payer: No Typology Code available for payment source | Admitting: Obstetrics and Gynecology

## 2021-12-31 ENCOUNTER — Ambulatory Visit: Payer: Self-pay | Admitting: Family Medicine

## 2022-02-04 NOTE — Progress Notes (Unsigned)
51 y.o. G61P1001 Married [place] American female here for annual exam.    PCP:  Sallee Lange, MD   No LMP recorded (lmp unknown). Patient has had a hysterectomy.           Sexually active: {yes no:314532}  The current method of family planning is status post hysterectomy.    Exercising: {yes no:314532}  {types:19826} Smoker:  no  Health Maintenance: Pap:  2014 Neg History of abnormal Pap:  no MMG:  07-02-21 Neg/Birads1 Colonoscopy:  04/28/17 normal f/u 10 years  BMD:   n/a  Result  n/a TDaP:  2015 Gardasil:   no HIV: Neg in past Hep C: no Screening Labs:  Hb today: ***, Urine today: ***   reports that she has never smoked. She has never used smokeless tobacco. She reports current alcohol use of about 1.0 standard drink of alcohol per week. She reports that she does not use drugs.  Past Medical History:  Diagnosis Date   Anxiety    Back problem    Bursitis of shoulder, right    Depression    Elevated hemoglobin A1c 2017   Hypertension    Low vitamin D level    Migraines    Uterine fibroid     Past Surgical History:  Procedure Laterality Date   ABDOMINAL HYSTERECTOMY  2010   [facility]--Dr. Quincy Simmonds   MYOMECTOMY  2006   -Dr. Quincy Simmonds   TUBAL LIGATION  2002   -Dr. Quincy Simmonds    Current Outpatient Medications  Medication Sig Dispense Refill   ALPRAZolam (XANAX) 1 MG tablet 1/2 tablet to 1 tablet twice daily as needed use sparingly for anxiety 45 tablet 1   amLODipine (NORVASC) 5 MG tablet Take 1 tablet (5 mg total) by mouth daily. PLEASE SCHEDULE FOLLOW UP APPT 90 tablet 1   escitalopram (LEXAPRO) 5 MG tablet TAKE 1 TABLET (5 MG TOTAL) BY MOUTH DAILY. 90 tablet 0   Current Facility-Administered Medications  Medication Dose Route Frequency Provider Last Rate Last Admin   0.9 %  sodium chloride infusion  500 mL Intravenous Once Danis, Kirke Corin, MD        Family History  Adopted: Yes  Problem Relation Age of Onset   Breast cancer Neg Hx     Review of Systems  Exam:   LMP   (LMP Unknown)     General appearance: alert, cooperative and appears stated age Head: normocephalic, without obvious abnormality, atraumatic Neck: no adenopathy, supple, symmetrical, trachea midline and thyroid normal to inspection and palpation Lungs: clear to auscultation bilaterally Breasts: normal appearance, no masses or tenderness, No nipple retraction or dimpling, No nipple discharge or bleeding, No axillary adenopathy Heart: regular rate and rhythm Abdomen: soft, non-tender; no masses, no organomegaly Extremities: extremities normal, atraumatic, no cyanosis or edema Skin: skin color, texture, turgor normal. No rashes or lesions Lymph nodes: cervical, supraclavicular, and axillary nodes normal. Neurologic: grossly normal  Pelvic: External genitalia:  no lesions              No abnormal inguinal nodes palpated.              Urethra:  normal appearing urethra with no masses, tenderness or lesions              Bartholins and Skenes: normal                 Vagina: normal appearing vagina with normal color and discharge, no lesions  Cervix: no lesions              Pap taken: {yes no:314532} Bimanual Exam:  Uterus:  normal size, contour, position, consistency, mobility, non-tender              Adnexa: no mass, fullness, tenderness              Rectal exam: {yes no:314532}.  Confirms.              Anus:  normal sphincter tone, no lesions  Chaperone was present for exam:  ***  Assessment:   Well woman visit with gynecologic exam.   Plan: Mammogram screening discussed. Self breast awareness reviewed. Pap and HR HPV as above. Guidelines for Calcium, Vitamin D, regular exercise program including cardiovascular and weight bearing exercise.   Follow up annually and prn.   Additional counseling given.  {yes no:314532}. _______ minutes face to face time of which over 50% was spent in counseling.    After visit summary provided.     

## 2022-02-06 ENCOUNTER — Ambulatory Visit (INDEPENDENT_AMBULATORY_CARE_PROVIDER_SITE_OTHER): Payer: No Typology Code available for payment source | Admitting: Obstetrics and Gynecology

## 2022-02-06 ENCOUNTER — Encounter: Payer: Self-pay | Admitting: Obstetrics and Gynecology

## 2022-02-06 VITALS — BP 130/82 | HR 72 | Ht 65.0 in | Wt 180.0 lb

## 2022-02-06 DIAGNOSIS — Z01419 Encounter for gynecological examination (general) (routine) without abnormal findings: Secondary | ICD-10-CM | POA: Diagnosis not present

## 2022-02-06 DIAGNOSIS — R7989 Other specified abnormal findings of blood chemistry: Secondary | ICD-10-CM

## 2022-02-06 NOTE — Patient Instructions (Addendum)
EXERCISE AND DIET:  We recommended that you start or continue a regular exercise program for good health. Regular exercise means any activity that makes your heart beat faster and makes you sweat.  We recommend exercising at least 30 minutes per day at least 3 days a week, preferably 4 or 5.  We also recommend a diet low in fat and sugar.  Inactivity, poor dietary choices and obesity can cause diabetes, heart attack, stroke, and kidney damage, among others.    ALCOHOL AND SMOKING:  Women should limit their alcohol intake to no more than 7 drinks/beers/glasses of wine (combined, not each!) per week. Moderation of alcohol intake to this level decreases your risk of breast cancer and liver damage. And of course, no recreational drugs are part of a healthy lifestyle.  And absolutely no smoking or even second hand smoke. Most people know smoking can cause heart and lung diseases, but did you know it also contributes to weakening of your bones? Aging of your skin?  Yellowing of your teeth and nails?  CALCIUM AND VITAMIN D:  Adequate intake of calcium and Vitamin D are recommended.  The recommendations for exact amounts of these supplements seem to change often, but generally speaking 600 mg of calcium (either carbonate or citrate) and 800 units of Vitamin D per day seems prudent. Certain women may benefit from higher intake of Vitamin D.  If you are among these women, your doctor will have told you during your visit.    PAP SMEARS:  Pap smears, to check for cervical cancer or precancers,  have traditionally been done yearly, although recent scientific advances have shown that most women can have pap smears less often.  However, every woman still should have a physical exam from her gynecologist every year. It will include a breast check, inspection of the vulva and vagina to check for abnormal growths or skin changes, a visual exam of the cervix, and then an exam to evaluate the size and shape of the uterus and  ovaries.  And after 53 years of age, a rectal exam is indicated to check for rectal cancers. We will also provide age appropriate advice regarding health maintenance, like when you should have certain vaccines, screening for sexually transmitted diseases, bone density testing, colonoscopy, mammograms, etc.   MAMMOGRAMS:  All women over 40 years old should have a yearly mammogram. Many facilities now offer a "3D" mammogram, which may cost around $50 extra out of pocket. If possible,  we recommend you accept the option to have the 3D mammogram performed.  It both reduces the number of women who will be called back for extra views which then turn out to be normal, and it is better than the routine mammogram at detecting truly abnormal areas.    COLONOSCOPY:  Colonoscopy to screen for colon cancer is recommended for all women at age 50.  We know, you hate the idea of the prep.  We agree, BUT, having colon cancer and not knowing it is worse!!  Colon cancer so often starts as a polyp that can be seen and removed at colonscopy, which can quite literally save your life!  And if your first colonoscopy is normal and you have no family history of colon cancer, most women don't have to have it again for 10 years.  Once every ten years, you can do something that may end up saving your life, right?  We will be happy to help you get it scheduled when you are ready.    Be sure to check your insurance coverage so you understand how much it will cost.  It may be covered as a preventative service at no cost, but you should check your particular policy.    Calcium Content in Foods Calcium is the most abundant mineral in the body. Most of the body's calcium supply is stored in bones and teeth. Calcium helps many parts of the body function normally, including: Blood and blood vessels. Nerves. Hormones. Muscles. Bones and teeth. When your calcium stores are low, you may be at risk for low bone mass, bone loss, and broken bones  (fractures). When you get enough calcium, it helps to support strong bones and teeth throughout your life. Calcium is especially important for: Children during growth spurts. Girls during adolescence. Women who are pregnant or breastfeeding. Women after their menstrual cycle stops (postmenopause). Women whose menstrual cycle has stopped due to anorexia nervosa or regular intense exercise. People who cannot eat or digest dairy products. Vegans. Recommended daily amounts of calcium: Women (ages 19 to 50): 1,000 mg per day. Women (ages 51 and older): 1,200 mg per day. Men (ages 19 to 70): 1,000 mg per day. Men (ages 71 and older): 1,200 mg per day. Women (ages 9 to 18): 1,300 mg per day. Men (ages 9 to 18): 1,300 mg per day. General information Eat foods that are high in calcium. Try to get most of your calcium from food. Some people may benefit from taking calcium supplements. Check with your health care provider or diet and nutrition specialist (dietitian) before starting any calcium supplements. Calcium supplements may interact with certain medicines. Too much calcium may cause other health problems, such as constipation and kidney stones. For the body to absorb calcium, it needs vitamin D. Sources of vitamin D include: Skin exposure to direct sunlight. Foods, such as egg yolks, liver, mushrooms, saltwater fish, and fortified milk. Vitamin D supplements. Check with your health care provider or dietitian before starting any vitamin D supplements. What foods are high in calcium?  Foods that are high in calcium contain more than 100 milligrams per serving. Fruits Fortified orange juice or other fruit juice, 300 mg per 8 oz serving. Vegetables Collard greens, 360 mg per 8 oz serving. Kale, 100 mg per 8 oz serving. Bok choy, 160 mg per 8 oz serving. Grains Fortified ready-to-eat cereals, 100 to 1,000 mg per 8 oz serving. Fortified frozen waffles, 200 mg in 2 waffles. Oatmeal, 140 mg in  1 cup. Meats and other proteins Sardines, canned with bones, 325 mg per 3 oz serving. Salmon, canned with bones, 180 mg per 3 oz serving. Canned shrimp, 125 mg per 3 oz serving. Baked beans, 160 mg per 4 oz serving. Tofu, firm, made with calcium sulfate, 253 mg per 4 oz serving. Dairy Yogurt, plain, low-fat, 310 mg per 6 oz serving. Nonfat milk, 300 mg per 8 oz serving. American cheese, 195 mg per 1 oz serving. Cheddar cheese, 205 mg per 1 oz serving. Cottage cheese 2%, 105 mg per 4 oz serving. Fortified soy, rice, or almond milk, 300 mg per 8 oz serving. Mozzarella, part skim, 210 mg per 1 oz serving. The items listed above may not be a complete list of foods high in calcium. Actual amounts of calcium may be different depending on processing. Contact a dietitian for more information. What foods are lower in calcium? Foods that are lower in calcium contain 50 mg or less per serving. Fruits Apple, about 6 mg. Banana, about 12 mg.   Vegetables Lettuce, 19 mg per 2 oz serving. Tomato, about 11 mg. Grains Rice, 4 mg per 6 oz serving. Boiled potatoes, 14 mg per 8 oz serving. White bread, 6 mg per slice. Meats and other proteins Egg, 27 mg per 2 oz serving. Red meat, 7 mg per 4 oz serving. Chicken, 17 mg per 4 oz serving. Fish, cod, or trout, 20 mg per 4 oz serving. Dairy Cream cheese, regular, 14 mg per 1 Tbsp serving. Brie cheese, 50 mg per 1 oz serving. Parmesan cheese, 70 mg per 1 Tbsp serving. The items listed above may not be a complete list of foods lower in calcium. Actual amounts of calcium may be different depending on processing. Contact a dietitian for more information. Summary Calcium is an important mineral in the body because it affects many functions. Getting enough calcium helps support strong bones and teeth throughout your life. Try to get most of your calcium from food. Calcium supplements may interact with certain medicines. Check with your health care provider  or dietitian before starting any calcium supplements. This information is not intended to replace advice given to you by your health care provider. Make sure you discuss any questions you have with your health care provider. Document Revised: 08/25/2019 Document Reviewed: 08/25/2019 Elsevier Patient Education  2023 Elsevier Inc.  

## 2022-02-07 LAB — VITAMIN D 25 HYDROXY (VIT D DEFICIENCY, FRACTURES): Vit D, 25-Hydroxy: 33 ng/mL (ref 30–100)

## 2022-02-14 ENCOUNTER — Ambulatory Visit: Payer: No Typology Code available for payment source | Admitting: Family Medicine

## 2022-02-20 ENCOUNTER — Ambulatory Visit: Payer: No Typology Code available for payment source | Admitting: Family Medicine

## 2022-02-20 ENCOUNTER — Encounter: Payer: Self-pay | Admitting: Family Medicine

## 2022-02-20 VITALS — BP 124/68 | Wt 185.6 lb

## 2022-02-20 DIAGNOSIS — I10 Essential (primary) hypertension: Secondary | ICD-10-CM

## 2022-02-20 DIAGNOSIS — Z13220 Encounter for screening for lipoid disorders: Secondary | ICD-10-CM

## 2022-02-20 DIAGNOSIS — Z79899 Other long term (current) drug therapy: Secondary | ICD-10-CM | POA: Diagnosis not present

## 2022-02-20 DIAGNOSIS — F419 Anxiety disorder, unspecified: Secondary | ICD-10-CM

## 2022-02-20 DIAGNOSIS — Z23 Encounter for immunization: Secondary | ICD-10-CM | POA: Diagnosis not present

## 2022-02-20 DIAGNOSIS — F32A Depression, unspecified: Secondary | ICD-10-CM

## 2022-02-20 MED ORDER — ESCITALOPRAM OXALATE 10 MG PO TABS: 10.0000 mg | ORAL_TABLET | Freq: Every day | ORAL | 1 refills | Status: DC

## 2022-02-20 MED ORDER — AMLODIPINE BESYLATE 5 MG PO TABS: 5.0000 mg | ORAL_TABLET | Freq: Every day | ORAL | 1 refills | Status: DC

## 2022-02-20 NOTE — Progress Notes (Signed)
Subjective:    Patient ID: Carrie Gordon, female    DOB: [birth year], 51 y.o.   MRN: [id number]  HPI Pt arrives for follow up. Pt states OB-GYN was going to let Dr.Kier Smead take over blood work. Pt states no issues with blood pressure at this time. Pt taking Amlodipine as directed.   Patient here today having some mild depression issues.  She is very stressed She is adopted She was trying to find out the medical history of her biologic parents but unable to do so, biologic parents did not want to reach out.  This was helpful for the patient.  Not suicidal. Review of Systems     Objective:   Physical Exam General-in no acute distress Eyes-no discharge Lungs-respiratory rate normal, CTA CV-no murmurs,RRR Extremities skin warm dry no edema Neuro grossly normal Behavior normal, alert        Assessment & Plan:  1. Need for vaccination Flu shot today - Flu Vaccine QUAD 6+ mos PF IM (Fluarix Quad PF)  2. Primary hypertension Blood pressure under good control continue current measures - amLODipine (NORVASC) 5 MG tablet; Take 1 tablet (5 mg total) by mouth daily.  Dispense: 90 tablet; Refill: 1 - Lipid panel - Hepatic function panel - Basic metabolic panel - Microalbumin/Creatinine Ratio, Urine  3. High risk medication use Lab work ordered - Lipid panel - Hepatic function panel - Basic metabolic panel - Microalbumin/Creatinine Ratio, Urine  4. Screening, lipid Screening - Lipid panel - Hepatic function panel - Basic metabolic panel - Microalbumin/Creatinine Ratio, Urine  5. Anxiety and depression Bump up dose of Lexapro new dose 10 mg patient to give [place] feedback within 3 weeks Recommended follow-up in 3 to 4 months  Patient defers on counseling

## 2022-02-21 LAB — HEPATIC FUNCTION PANEL
ALT: 28 IU/L (ref 0–32)
AST: 26 IU/L (ref 0–40)
Albumin: 4.5 g/dL (ref 3.8–4.9)
Alkaline Phosphatase: 121 IU/L (ref 44–121)
Bilirubin Total: 0.8 mg/dL (ref 0.0–1.2)
Bilirubin, Direct: 0.18 mg/dL (ref 0.00–0.40)
Total Protein: 7.7 g/dL (ref 6.0–8.5)

## 2022-02-21 LAB — MICROALBUMIN / CREATININE URINE RATIO
Creatinine, Urine: 37.9 mg/dL
Microalb/Creat Ratio: <8 mg/g creat (ref 0–29)
Microalbumin, Urine: <3 ug/mL

## 2022-02-21 LAB — BASIC METABOLIC PANEL
BUN/Creatinine Ratio: 9 (ref 9–23)
BUN: 7 mg/dL (ref 6–24)
CO2: 24 mmol/L (ref 20–29)
Calcium: 9.5 mg/dL (ref 8.7–10.2)
Chloride: 101 mmol/L (ref 96–106)
Creatinine, Ser: 0.81 mg/dL (ref 0.57–1.00)
Glucose: 86 mg/dL (ref 70–99)
Potassium: 4 mmol/L (ref 3.5–5.2)
Sodium: 140 mmol/L (ref 134–144)
eGFR: 87 mL/min/{1.73_m2} (ref >59)

## 2022-02-21 LAB — LIPID PANEL
Chol/HDL Ratio: 2.8 ratio (ref 0.0–4.4)
Cholesterol, Total: 204 mg/dL — ABNORMAL HIGH (ref 100–199)
HDL: 73 mg/dL (ref >39)
LDL Chol Calc (NIH): 117 mg/dL — ABNORMAL HIGH (ref 0–99)
Triglycerides: 77 mg/dL (ref 0–149)
VLDL Cholesterol Cal: 14 mg/dL (ref 5–40)

## 2022-03-06 ENCOUNTER — Other Ambulatory Visit: Payer: Self-pay | Admitting: Family Medicine

## 2022-03-06 DIAGNOSIS — F419 Anxiety disorder, unspecified: Secondary | ICD-10-CM

## 2022-05-23 ENCOUNTER — Ambulatory Visit: Payer: No Typology Code available for payment source | Admitting: Family Medicine

## 2022-07-04 ENCOUNTER — Ambulatory Visit: Payer: No Typology Code available for payment source | Admitting: Family Medicine

## 2022-07-04 ENCOUNTER — Other Ambulatory Visit: Payer: Self-pay | Admitting: Obstetrics and Gynecology

## 2022-07-04 ENCOUNTER — Telehealth: Payer: Self-pay

## 2022-07-04 VITALS — BP 125/85 | HR 82 | Wt 199.6 lb

## 2022-07-04 DIAGNOSIS — F419 Anxiety disorder, unspecified: Secondary | ICD-10-CM | POA: Diagnosis not present

## 2022-07-04 DIAGNOSIS — R110 Nausea: Secondary | ICD-10-CM

## 2022-07-04 DIAGNOSIS — K30 Functional dyspepsia: Secondary | ICD-10-CM

## 2022-07-04 DIAGNOSIS — Z1231 Encounter for screening mammogram for malignant neoplasm of breast: Secondary | ICD-10-CM

## 2022-07-04 DIAGNOSIS — I10 Essential (primary) hypertension: Secondary | ICD-10-CM | POA: Diagnosis not present

## 2022-07-04 MED ORDER — ONDANSETRON HCL 8 MG PO TABS: ORAL_TABLET | ORAL | 0 refills | Status: DC

## 2022-07-04 MED ORDER — ALPRAZOLAM 1 MG PO TABS: ORAL_TABLET | ORAL | 1 refills | Status: DC

## 2022-07-04 MED ORDER — PANTOPRAZOLE SODIUM 40 MG PO TBEC: 40.0000 mg | DELAYED_RELEASE_TABLET | Freq: Every day | ORAL | 3 refills | Status: DC

## 2022-07-04 NOTE — Patient Instructions (Signed)
Food Choices for Gastroesophageal Reflux Disease, Adult When you have gastroesophageal reflux disease (GERD), the foods you eat and your eating habits are very important. Choosing the right foods can help ease the discomfort of GERD. Consider working with a dietitian to help you make healthy food choices. What are tips for following this plan? Reading food labels Look for foods that are low in saturated fat. Foods that have less than 5% of daily value (DV) of fat and 0 g of trans fats may help with your symptoms. Cooking Cook foods using methods other than frying. This may include baking, steaming, grilling, or broiling. These are all methods that do not need a lot of fat for cooking. To add flavor, try to use herbs that are low in spice and acidity. Meal planning  Choose healthy foods that are low in fat, such as fruits, vegetables, whole grains, low-fat dairy products, lean meats, fish, and poultry. Eat frequent, small meals instead of three large meals each day. Eat your meals slowly, in a relaxed setting. Avoid bending over or lying down until 2-3 hours after eating. Limit high-fat foods such as fatty meats or fried foods. Limit your intake of fatty foods, such as oils, butter, and shortening. Avoid the following as told by your health care provider: Foods that cause symptoms. These may be different for different people. Keep a food diary to keep track of foods that cause symptoms. Alcohol. Drinking large amounts of liquid with meals. Eating meals during the 2-3 hours before bed. Lifestyle Maintain a healthy weight. Ask your health care provider what weight is healthy for you. If you need to lose weight, work with your health care provider to do so safely. Exercise for at least 30 minutes on 5 or more days each week, or as told by your health care provider. Avoid wearing clothes that fit tightly around your waist and chest. Do not use any products that contain nicotine or tobacco. These  products include cigarettes, chewing tobacco, and vaping devices, such as e-cigarettes. If you need help quitting, ask your health care provider. Sleep with the head of your bed raised. Use a wedge under the mattress or blocks under the bed frame to raise the head of the bed. Chew sugar-free gum after mealtimes. What foods should I eat?  Eat a healthy, well-balanced diet of fruits, vegetables, whole grains, low-fat dairy products, lean meats, fish, and poultry. Each person is different. Foods that may trigger symptoms in one person may not trigger any symptoms in another person. Work with your health care provider to identify foods that are safe for you. The items listed above may not be a complete list of recommended foods and beverages. Contact a dietitian for more information. What foods should I avoid? Limiting some of these foods may help manage the symptoms of GERD. Everyone is different. Consult a dietitian or your health care provider to help you identify the exact foods to avoid, if any. Fruits Any fruits prepared with added fat. Any fruits that cause symptoms. For some people this may include citrus fruits, such as oranges, grapefruit, pineapple, and lemons. Vegetables Deep-fried vegetables. French fries. Any vegetables prepared with added fat. Any vegetables that cause symptoms. For some people, this may include tomatoes and tomato products, chili peppers, onions and garlic, and horseradish. Grains Pastries or quick breads with added fat. Meats and other proteins High-fat meats, such as fatty beef or pork, hot dogs, ribs, ham, sausage, salami, and bacon. Fried meat or protein, including   fried fish and fried chicken. Nuts and nut butters, in large amounts. Dairy Whole milk and chocolate milk. Sour cream. Cream. Ice cream. Cream cheese. Milkshakes. Fats and oils Butter. Margarine. Shortening. Ghee. Beverages Coffee and tea, with or without caffeine. Carbonated beverages. Sodas. Energy  drinks. Fruit juice made with acidic fruits, such as orange or grapefruit. Tomato juice. Alcoholic drinks. Sweets and desserts Chocolate and cocoa. Donuts. Seasonings and condiments Pepper. Peppermint and spearmint. Added salt. Any condiments, herbs, or seasonings that cause symptoms. For some people, this may include curry, hot sauce, or vinegar-based salad dressings. The items listed above may not be a complete list of foods and beverages to avoid. Contact a dietitian for more information. Questions to ask your health care provider Diet and lifestyle changes are usually the first steps that are taken to manage symptoms of GERD. If diet and lifestyle changes do not improve your symptoms, talk with your health care provider about taking medicines. Where to find more information International Foundation for Gastrointestinal [employer]: aboutgerd.org Summary When you have gastroesophageal reflux disease (GERD), food and lifestyle choices may be very helpful in easing the discomfort of GERD. Eat frequent, small meals instead of three large meals each day. Eat your meals slowly, in a relaxed setting. Avoid bending over or lying down until 2-3 hours after eating. Limit high-fat foods such as fatty meats or fried foods. This information is not intended to replace advice given to you by your health care provider. Make sure you discuss any questions you have with your health care provider. Document Revised: 11/08/2019 Document Reviewed: 11/08/2019 Elsevier Patient Education  2023 Elsevier Inc.  

## 2022-07-04 NOTE — Progress Notes (Signed)
Subjective:    Patient ID: Carrie Gordon, female    DOB: [birth year], 52 y.o.   MRN: [id number]  HPI Patient arrives today for 4 month follow up for medication.   Patient has concerns about acid reflux x month.  Nausea after eat and unable to burp.  Primary hypertension  Anxiety - Plan: ALPRAZolam (XANAX) 1 MG tablet  Indigestion - Plan: NM Hepato W/Eject Fract  Nausea - Plan: NM Hepato W/Eject Fract  She is here for follow-up She relates anxiety depression symptoms doing better She does use the alprazolam intermittently to help with anxiety.  We did discuss serotonin reuptake inhibitor she is not interested currently She does try to work her way through emotional issues as best as possible  She also relates having a lot of regurgitation tasting stuff in the back of her mouth this happens multiple times per week and finds herself feeling nauseous and at times she even regurgitates she denies severe pain but does get some epigastric discomfort and indigestion she went to the ER they did a evaluation and diagnosed her with reflux but did ultrasound which was negative  Blood pressure related issues takes her medicine regular basis  Probable menopause symptoms.  Review of Systems     Objective:   Physical Exam General-in no acute distress Eyes-no discharge Lungs-respiratory rate normal, CTA CV-no murmurs,RRR Extremities skin warm dry no edema Neuro grossly normal Behavior normal, alert         Assessment & Plan:  Reflux related condition Nausea issues with belching Need to rule out gallbladder Had ultrasound which was negative Will go ahead with HIDA Also Protonix daily Patient will give [place] update within 2 to 3 weeks If HIDA is negative and patient not better GI referral for EGD If doing better on medicine HIDA negative stick with medicine for at least 90 days  Sleep issues patient sometimes uses alprazolam or sometimes alcohol to help her sleep I  gave her a printout regarding behavioral approaches to help with sleep and to try to help with her staying asleep or at least getting back to sleep  Her weight has gone up she states that she is trying to eat healthier gave her printout regarding obesity and healthy eating  Her depression is not partial remission but she is doing a good job with self-care she does not want to be on medications  Patient having some hot flashes with menopause but no sign of any red flags.  Patient prefers to treat this by just working her way through it.  HTN- patient seen for follow-up regarding HTN.   Diet, medication compliance, appropriate labs and refills were completed.   Importance of keeping blood pressure under good control to lessen the risk of complications discussed Regular follow-up visits discussed  Follow-up 6 months

## 2022-07-04 NOTE — Telephone Encounter (Signed)
Pt need refill on her ALPRAZolam Duanne Moron) 1 MG tablet   Sent to [id number]  Pt call back [place]

## 2022-07-04 NOTE — Telephone Encounter (Signed)
Refills sent in at pt office visit this morning. Pt contacted and made aware

## 2022-07-15 ENCOUNTER — Encounter (HOSPITAL_COMMUNITY): Admission: RE | Admit: 2022-07-15 | Payer: No Typology Code available for payment source | Source: Ambulatory Visit

## 2022-08-03 ENCOUNTER — Emergency Department (HOSPITAL_BASED_OUTPATIENT_CLINIC_OR_DEPARTMENT_OTHER): Payer: No Typology Code available for payment source

## 2022-08-03 ENCOUNTER — Other Ambulatory Visit: Payer: Self-pay

## 2022-08-03 ENCOUNTER — Emergency Department (HOSPITAL_BASED_OUTPATIENT_CLINIC_OR_DEPARTMENT_OTHER)
Admission: EM | Admit: 2022-08-03 | Discharge: 2022-08-03 | Disposition: A | Discharge status: Home / Self Care | Payer: No Typology Code available for payment source | Attending: Emergency Medicine | Admitting: Emergency Medicine

## 2022-08-03 ENCOUNTER — Encounter (HOSPITAL_BASED_OUTPATIENT_CLINIC_OR_DEPARTMENT_OTHER): Payer: Self-pay

## 2022-08-03 DIAGNOSIS — Z79899 Other long term (current) drug therapy: Secondary | ICD-10-CM | POA: Insufficient documentation

## 2022-08-03 DIAGNOSIS — I10 Essential (primary) hypertension: Secondary | ICD-10-CM | POA: Diagnosis not present

## 2022-08-03 DIAGNOSIS — M79601 Pain in right arm: Secondary | ICD-10-CM | POA: Diagnosis present

## 2022-08-03 DIAGNOSIS — M7711 Lateral epicondylitis, right elbow: Secondary | ICD-10-CM | POA: Insufficient documentation

## 2022-08-03 MED ORDER — PREDNISONE 20 MG PO TABS: 40.0000 mg | ORAL_TABLET | Freq: Every day | ORAL | 0 refills | Status: DC

## 2022-08-03 MED ORDER — PREDNISONE 20 MG PO TABS
40.0000 mg | ORAL_TABLET | Freq: Once | ORAL | Status: AC
  Administered 2022-08-03: 40 mg via ORAL
  Filled 2022-08-03: qty 2

## 2022-08-03 MED ORDER — KETOROLAC TROMETHAMINE 15 MG/ML IJ SOLN
15.0000 mg | Freq: Once | INTRAMUSCULAR | Status: AC
  Administered 2022-08-03: 15 mg via INTRAMUSCULAR
  Filled 2022-08-03: qty 1

## 2022-08-03 NOTE — ED Triage Notes (Signed)
POV from home, A&Ox 4, GCS 15, amb to triage  Pt c/o right arm pain that radiates from forearm to shoulder and sts that now its going to her neck. Denies injury.

## 2022-08-03 NOTE — ED Notes (Signed)
Patient verbalizes understanding of discharge instructions. Opportunity for questioning and answers were provided. Armband removed by staff, pt discharged from ED. Ambulated out to lobby with husband  

## 2022-08-05 NOTE — ED Provider Notes (Signed)
[facility] Provider Note   CSN: [id number] Arrival date & time: 08/03/22  2014     History  Chief Complaint  Patient presents with   Arm Pain    Carrie Gordon is a 52 y.o. female.   Arm Pain   Patient presents with right arm pain.  Laterally.  Worse with certain movements.  Goes up to her neck.  No numbness or weakness.  No definite injury.  Has not had pain like this before.     Past Medical History:  Diagnosis Date   Anxiety    Back problem    Bursitis of shoulder, right    Depression    Elevated hemoglobin A1c 2017   Hypertension    Low vitamin D level    Migraines    Uterine fibroid     Home Medications Prior to Admission medications   Medication Sig Start Date End Date Taking? Authorizing Provider  predniSONE (DELTASONE) 20 MG tablet Take 2 tablets (40 mg total) by mouth daily. 08/03/22  Yes Davonna Belling, MD  ALPRAZolam Duanne Moron) 1 MG tablet TAKE 1/2 TABLET TO 1 TABLET TWICE DAILY AS NEEDED USE SPARINGLY FOR ANXIETY 07/04/22   Kathyrn Drown, MD  amLODipine (NORVASC) 5 MG tablet Take 1 tablet (5 mg total) by mouth daily. 02/20/22   Kathyrn Drown, MD  ondansetron (ZOFRAN) 8 MG tablet 1 q 8 prn nausea 07/04/22   Kathyrn Drown, MD  pantoprazole (PROTONIX) 40 MG tablet Take 1 tablet (40 mg total) by mouth daily. 07/04/22   Kathyrn Drown, MD      Allergies    Patient has no known allergies.    Review of Systems   Review of Systems  Physical Exam Updated Vital Signs BP (!) 139/99   Pulse 77   Temp 98.4 F (36.9 C) (Oral)   Resp 18   Ht 5\' 5"  (1.651 m) Comment: Simultaneous filing. User may not have seen previous data.  Wt 90.3 kg Comment: Simultaneous filing. User may not have seen previous data.  LMP  (LMP Unknown)   SpO2 100%   BMI 33.12 kg/m  Physical Exam Vitals and nursing note reviewed.  Musculoskeletal:        General: Tenderness present.     Comments: Tenderness over right  elbow laterally.  Good movement passively but more pain with active movement.  Pulse intact distally.  No shoulder or wrist tenderness.  Neurological:     Mental Status: She is alert.     ED Results / Procedures / Treatments   Labs (all labs ordered are listed, but only abnormal results are displayed) Labs Reviewed - No data to display  EKG None  Radiology DG Elbow Complete Right  Result Date: 08/03/2022 CLINICAL DATA:  Right elbow pain, initial encounter EXAM: RIGHT ELBOW - COMPLETE 3+ VIEW COMPARISON:  None Available. FINDINGS: No acute fracture or dislocation is noted. Mild spurring of the coronoid process is noted. No joint effusion is seen. IMPRESSION: No acute abnormality noted. Electronically Signed   By: Inez Catalina M.D.   On: 08/03/2022 22:01    Procedures Procedures    Medications Ordered in ED Medications  ketorolac (TORADOL) 15 MG/ML injection 15 mg (15 mg Intramuscular Given 08/03/22 2219)  predniSONE (DELTASONE) tablet 40 mg (40 mg Oral Given 08/03/22 2220)    ED Course/ Medical Decision Making/ A&P  Medical Decision Making Amount and/or Complexity of Data Reviewed Radiology: ordered.  Risk Prescription drug management.   Patient with right elbow/forearm pain.  Potentially a lateral epicondylitis.  X-ray reassuring.  No effusion.  Symptomatic treatment and anti-inflammatory prednisone.  Outpatient follow-up as needed.  Discharge home.       Final Clinical Impression(s) / ED Diagnoses Final diagnoses:  Lateral epicondylitis of right elbow    Rx / DC Orders ED Discharge Orders          Ordered    predniSONE (DELTASONE) 20 MG tablet  Daily        08/03/22 2229              Davonna Belling, MD 08/05/22 [phone]

## 2022-08-05 NOTE — Telephone Encounter (Signed)
Pt was seen at [facility] for right arm. Discharge says see Dr Ninfa Linden. Please open slot and call pt at [phone].

## 2022-08-06 ENCOUNTER — Telehealth: Payer: Self-pay

## 2022-08-06 NOTE — Transitions of Care (Post Inpatient/ED Visit) (Signed)
08/06/2022  Name: Carrie Gordon MRN: [id number] DOB: [birth year]  Today's TOC FU Call Status: Today's TOC FU Call Status:: Successful TOC FU Call Competed Unsuccessful Call (1st Attempt) Date: 08/06/22 [facility] FU Call Complete Date: 08/06/22  Transition Care Management Follow-up Telephone Call Date of Discharge: 08/03/22 Discharge Facility: [facility] ([facility]) Type of Discharge: Emergency Department Reason for ED Visit: Other: (epicondylitis right elbow) How have you been since you were released from the hospital?: Better Any questions or concerns?: No  Items Reviewed: Did you receive and understand the discharge instructions provided?: Yes Medications obtained and verified?: Yes (Medications Reviewed) Any new allergies since your discharge?: No Dietary orders reviewed?: Yes Do you have support at home?: Yes People in Home: spouse  Home Care and Equipment/Supplies: Were Home Health Services Ordered?: NA Any new equipment or medical supplies ordered?: NA  Functional Questionnaire: Do you need assistance with bathing/showering or dressing?: No Do you need assistance with meal preparation?: No Do you need assistance with eating?: No Do you have difficulty maintaining continence: No Do you need assistance with getting out of bed/getting out of a chair/moving?: No Do you have difficulty managing or taking your medications?: No  Follow up appointments reviewed: Specialist Hospital Follow-up appointment confirmed?: Yes Date of Specialist follow-up appointment?: 08/12/22 Follow-Up Specialty Provider:: Dr Shon Baton ortho Do you need transportation to your follow-up appointment?: No Do you understand care options if your condition(s) worsen?: Yes-patient verbalized understanding    SIGNATURE Karena Addison, LPN [facility] Nurse Health Advisor Direct Dial [phone]

## 2022-08-06 NOTE — Transitions of Care (Post Inpatient/ED Visit) (Signed)
08/06/2022  Name: Carrie Gordon MRN: [id number] DOB: 18-Nov-1970  Today's TOC FU Call Status: Today's TOC FU Call Status:: Unsuccessul Call (1st Attempt) Unsuccessful Call (1st Attempt) Date: 08/06/22  Attempted to reach the patient regarding the most recent Inpatient/ED visit.  Follow Up Plan: Additional outreach attempts will be made to reach the patient to complete the Transitions of Care (Post Inpatient/ED visit) call.   Signature Karena Addison, LPN [facility] Nurse Health Advisor Direct Dial [phone]

## 2022-08-12 ENCOUNTER — Ambulatory Visit (INDEPENDENT_AMBULATORY_CARE_PROVIDER_SITE_OTHER): Payer: No Typology Code available for payment source | Admitting: Sports Medicine

## 2022-08-12 ENCOUNTER — Ambulatory Visit: Payer: No Typology Code available for payment source | Admitting: Sports Medicine

## 2022-08-12 ENCOUNTER — Encounter: Payer: Self-pay | Admitting: Sports Medicine

## 2022-08-12 DIAGNOSIS — M7711 Lateral epicondylitis, right elbow: Secondary | ICD-10-CM

## 2022-08-12 MED ORDER — DICLOFENAC SODIUM 75 MG PO TBEC: 75.0000 mg | DELAYED_RELEASE_TABLET | Freq: Two times a day (BID) | ORAL | 0 refills | Status: AC

## 2022-08-12 NOTE — Progress Notes (Signed)
Carrie Gordon - 52 y.o. female MRN [id number]  Date of birth: [birth year]  Office Visit Note: Visit Date: 08/12/2022 PCP: Babs Sciara, MD Referred by: Babs Sciara, MD  Subjective: Chief Complaint  Patient presents with   Right Elbow - Pain   HPI: Carrie Gordon is a pleasant 52 y.o. female who presents today for right elbow pain.  She is right-hand dominant.  She has had 2 weeks of pain over the right lateral elbow.  She does work at [employer] and does a lot of typing and repetitive work, this can exacerbate her pain.  Denies any specific injury.  After this had been flared up on him she was reaching behind her to spray something and feels this exacerbated her pain.  Pain is over the elbow and will radiate both down and up the arm when it is at its worst.  She denies any numbness or tingling. Worse with motions about the wrist/forearm.  Pertinent ROS were reviewed with the patient and found to be negative unless otherwise specified above in HPI.   Assessment & Plan: Visit Diagnoses:  1. Lateral epicondylitis, right elbow    Plan: Discussed with [name] today that she does seem to have any acute episode of lateral epicondylitis.  We discussed all treatment options such as oral medication therapy, formal physical therapy versus home rehab, injection therapy, advanced imaging.  Given the degree of her pain, we will start her on oral Voltaren 75 mg to be taken twice daily with food.  I would like her to ice the area for 15 minutes multiple times a day if possible.  Did provide her information on a counterforce strap that she will wear with activity.  We did print out a customized handout for lateral epicondylitis exercises, my athletic trainer, Lequita Halt did review these in the room with her today.  She will start these in 2 days once her acute pain subsides and perform once daily.  She is interested in injection therapy, if this does not settle down in the next  week, she may return for ultrasound-guided lateral epicondyle injection and further evaluation.   Additional treatment considerations: ECSWT, Nitro patch, Formal PT  Follow-up: Return for in next 1-2 weeks (earliest convenience) for US-guided lat epicondyle injection.   Meds & Orders:  Meds ordered this encounter  Medications   diclofenac (VOLTAREN) 75 MG EC tablet    Sig: Take 1 tablet (75 mg total) by mouth 2 (two) times daily for 21 days.    Dispense:  42 tablet    Refill:  0   No orders of the defined types were placed in this encounter.    Procedures: No procedures performed      Clinical History: No specialty comments available.  She reports that she has never smoked. She has never used smokeless tobacco. No results for input(s): "HGBA1C", "LABURIC" in the last 8760 hours.  Objective:   Vital Signs: LMP  (LMP Unknown)   Physical Exam  Gen: Well-appearing, in no acute distress; non-toxic CV: Regular Rate. Well-perfused. Warm.  Resp: Breathing unlabored on room air; no wheezing. Psych: Fluid speech in conversation; appropriate affect; normal thought process Neuro: Sensation intact throughout. No gross coordination deficits.   Ortho Exam - Right elbow: + Exquisite TTP over the lateral epicondyle and the proximal extensor tendon origin.  Pain is reproduced with resisted wrist extension and resisted wrist supination/pronation.  Positive third finger resisted extension test.  Grip strength intact.  No overlying  redness or swelling.  There is full range of motion about the elbow without any mechanical blocks. NVI.   Imaging:  *Independent review and interpretation of the right elbow x-ray from 08/03/2022 was performed by myself today.  3 views of the elbow were reviewed and show spurring off the coronoid process.  There is no anterior or posterior fat pad sign.  No acute fracture or other bony abnormality noted.  DG Elbow Complete Right CLINICAL DATA:  Right elbow pain,  initial encounter  EXAM: RIGHT ELBOW - COMPLETE 3+ VIEW  COMPARISON:  None Available.  FINDINGS: No acute fracture or dislocation is noted. Mild spurring of the coronoid process is noted. No joint effusion is seen.  IMPRESSION: No acute abnormality noted.  Electronically Signed   By: Alcide Clever M.D.   On: 08/03/2022 22:01    Past Medical/Family/Surgical/Social History: Medications & Allergies reviewed per EMR, new medications updated. Patient Active Problem List   Diagnosis Date Noted   Primary hypertension 07/02/2021   Near syncope 12/31/2019   Other fatigue 12/31/2019   Anxiety 08/26/2017   Insomnia 12/19/2016   Migraines 12/06/2014   Past Medical History:  Diagnosis Date   Anxiety    Back problem    Bursitis of shoulder, right    Depression    Elevated hemoglobin A1c 2017   Hypertension    Low vitamin D level    Migraines    Uterine fibroid    Family History  Adopted: Yes  Problem Relation Age of Onset   Breast cancer Neg Hx    Past Surgical History:  Procedure Laterality Date   ABDOMINAL HYSTERECTOMY  2010   TAH--Dr. Edward Jolly   MYOMECTOMY  2006   -Dr. Edward Jolly   TUBAL LIGATION  2002   -Dr. Edward Jolly   Social History   Occupational History   Not on file  Tobacco Use   Smoking status: Never   Smokeless tobacco: Never  Vaping Use   Vaping Use: Never used  Substance and Sexual Activity   Alcohol use: Yes    Alcohol/week: 1.0 standard drink of alcohol    Types: 1 Glasses of wine per week   Drug use: No   Sexual activity: Yes    Partners: Male    Birth control/protection: Surgical    Comment: TAH

## 2022-08-12 NOTE — Progress Notes (Signed)
1 week of pain No injury Works from home; using computer/mouse  Pain primarily when she goes to reach or pick up something  Xrays done in ED  Was given a toradol shot in ED and prednisone. No relief  Patient was instructed in 10 minutes of therapeutic exercises for right elbow pain to improve strength, ROM and function according to my instructions and plan of care by a Certified Athletic Trainer during the office visit. A customized handout was provided and demonstration of proper technique shown and discussed. Patient did perform exercises and demonstrate understanding through teachback.  All questions discussed and answered.

## 2022-08-15 ENCOUNTER — Ambulatory Visit
Admission: RE | Admit: 2022-08-15 | Discharge: 2022-08-15 | Disposition: A | Discharge status: Home / Self Care | Payer: No Typology Code available for payment source | Source: Ambulatory Visit | Attending: Obstetrics and Gynecology | Admitting: Obstetrics and Gynecology

## 2022-08-15 ENCOUNTER — Ambulatory Visit: Payer: No Typology Code available for payment source | Admitting: Sports Medicine

## 2022-08-15 DIAGNOSIS — Z1231 Encounter for screening mammogram for malignant neoplasm of breast: Secondary | ICD-10-CM

## 2022-08-20 ENCOUNTER — Ambulatory Visit: Payer: No Typology Code available for payment source | Admitting: Sports Medicine

## 2022-08-20 ENCOUNTER — Other Ambulatory Visit: Payer: Self-pay

## 2022-08-20 ENCOUNTER — Encounter: Payer: Self-pay | Admitting: Sports Medicine

## 2022-08-20 DIAGNOSIS — M7711 Lateral epicondylitis, right elbow: Secondary | ICD-10-CM | POA: Diagnosis not present

## 2022-08-20 DIAGNOSIS — M25521 Pain in right elbow: Secondary | ICD-10-CM

## 2022-08-20 MED ORDER — LIDOCAINE HCL 1 % IJ SOLN
1.0000 mL | INTRAMUSCULAR | Status: AC | PRN
  Administered 2022-08-20: 1 mL

## 2022-08-20 MED ORDER — BUPIVACAINE HCL 0.25 % IJ SOLN
1.0000 mL | INTRAMUSCULAR | Status: AC | PRN
  Administered 2022-08-20: 1 mL

## 2022-08-20 MED ORDER — BETAMETHASONE SOD PHOS & ACET 6 (3-3) MG/ML IJ SUSP
6.0000 mg | INTRAMUSCULAR | Status: AC | PRN
  Administered 2022-08-20: 6 mg via INTRA_ARTICULAR

## 2022-08-20 NOTE — Progress Notes (Signed)
Carrie Gordon - 52 y.o. female MRN [id number]  Date of birth: 1971-04-05  Office Visit Note: Visit Date: 08/20/2022 PCP: Babs Sciara, MD Referred by: Babs Sciara, MD  Subjective: Chief Complaint  Patient presents with   Right Elbow - Pain   HPI: Carrie Gordon is a pleasant 52 y.o. female who presents today for follow-up of right lateral epicondylitis.   She is being managed on oral Voltaren 75mg  BID, hasn't taken it twice every day. Has been able to do the HEP we provided. She is using a counterforce strap; does not like to ice Currently feels like she is still experiencing a decent amount of pain.  Pertinent ROS were reviewed with the patient and found to be negative unless otherwise specified above in HPI.   Assessment & Plan: Visit Diagnoses:  1. Lateral epicondylitis, right elbow   2. Pain in right elbow    Plan: Discussed with Britta Mccreedy and her treatment options.  She will continue her oral Voltaren 75 mg twice daily for the next 2 weeks, then may go to as needed.  Through shared decision-making, did proceed with ultrasound-guided lateral epicondyle injection.  She will be rest and activity modification for the next 48 hours.  Starting Friday she may getting back into her home rehab, I stressed the importance of doing this once daily.  May use ice for any pain postinjection pain.  Discussed proper ergonomics and activity modification until better.  I would hope that by 1 month from today she will be out of this medication for she will return and we will discuss other options.  Additional treatment considerations: ECSWT, Nitro patch, Formal PT   Follow-up: Return in about 4 weeks (around 09/17/2022) for if not > 90% improved.   Meds & Orders: No orders of the defined types were placed in this encounter.   Orders Placed This Encounter  Procedures   Hand/UE Inj   US Guided Needle Placement - No Linked Charges   [place] Extrem Up Right Ltd      Procedures: Hand/UE Inj: R elbow for lateral epicondylitis on 08/20/2022 8:43 AM Indications: pain Details: 25 G needle, dorsal approach Medications: 1 mL lidocaine 1 %; 1 mL bupivacaine 0.25 %; 6 mg betamethasone acetate-betamethasone sodium phosphate 6 (3-3) MG/ML Outcome: tolerated well, no immediate complications  Procedure: Lateral Epicondylitis (Common Extensor Tendon) Injection, Right Elbow After informed verbal consent was obtained, a timeout was performed.  Patient was lying supine on exam table.  Area overlying the affected lateral epicondyle was prepped with chloraprep and alcohol swabs. Then utilizing ultrasound guidance via an in-plane approach, the patient's lateral epicondyle and common extensor tendon was injected with 1:1:1 lidocaine:bupivicaine:betamethasone with multiple needle fenestrations from a distal to proximal direction. Patient tolerated procedure well without immediate complications.  Procedure, treatment alternatives, risks and benefits explained, specific risks discussed. Consent was given by the patient. Immediately prior to procedure a time out was called to verify the correct patient, procedure, equipment, support staff and site/side marked as required. Patient was prepped and draped in the usual sterile fashion.          Clinical History: No specialty comments available.  She reports that she has never smoked. She has never used smokeless tobacco. No results for input(s): "HGBA1C", "LABURIC" in the last 8760 hours.  Objective:   Vital Signs: LMP  (LMP Unknown)   Physical Exam  Gen: Well-appearing, in no acute distress; non-toxic CV: Regular Rate. Well-perfused. Warm.  Resp: Breathing unlabored  on room air; no wheezing. Psych: Fluid speech in conversation; appropriate affect; normal thought process Neuro: Sensation intact throughout. No gross coordination deficits.   Ortho Exam - Right elbow: + TTP over the lateral epicondyle and common extensor  tendon origin.  Pain with resisted wrist extension and resisted wrist supination/pronation.  Positive Maulder's and Cozen's test.  Imaging: [place] Extrem Up Right Ltd  Result Date: 08/20/2022 Limited musculoskeletal ultrasound, right elbow, was performed today.  Evaluation of the lateral epicondyle shows no cortical irregularity.  There is proper insertion of the common extensor tendon origin with some mixed echogenicity near the insertion.  No evidence of high-grade tearing, likely some tendinosis present.  Mild hyperemia.  Elbow joint evaluated without effusion. *Technically successful ultrasound-guided lateral epicondyle tendon sheath injection   Past Medical/Family/Surgical/Social History: Medications & Allergies reviewed per EMR, new medications updated. Patient Active Problem List   Diagnosis Date Noted   Primary hypertension 07/02/2021   Near syncope 12/31/2019   Other fatigue 12/31/2019   Anxiety 08/26/2017   Insomnia 12/19/2016   Migraines 12/06/2014   Past Medical History:  Diagnosis Date   Anxiety    Back problem    Bursitis of shoulder, right    Depression    Elevated hemoglobin A1c 2017   Hypertension    Low vitamin D level    Migraines    Uterine fibroid    Family History  Adopted: Yes  Problem Relation Age of Onset   Breast cancer Neg Hx    Past Surgical History:  Procedure Laterality Date   ABDOMINAL HYSTERECTOMY  2010   TAH--Dr. Edward Jolly   MYOMECTOMY  2006   -Dr. Edward Jolly   TUBAL LIGATION  2002   -Dr. Edward Jolly   Social History   Occupational History   Not on file  Tobacco Use   Smoking status: Never   Smokeless tobacco: Never  Vaping Use   Vaping Use: Never used  Substance and Sexual Activity   Alcohol use: Yes    Alcohol/week: 1.0 standard drink of alcohol    Types: 1 Glasses of wine per week   Drug use: No   Sexual activity: Yes    Partners: Male    Birth control/protection: Surgical    Comment: TAH

## 2022-08-22 ENCOUNTER — Other Ambulatory Visit (HOSPITAL_COMMUNITY): Payer: No Typology Code available for payment source

## 2022-08-22 ENCOUNTER — Encounter (HOSPITAL_COMMUNITY)
Admission: RE | Admit: 2022-08-22 | Discharge: 2022-08-22 | Disposition: A | Discharge status: Home / Self Care | Payer: No Typology Code available for payment source | Source: Ambulatory Visit | Attending: Family Medicine | Admitting: Family Medicine

## 2022-08-22 DIAGNOSIS — K30 Functional dyspepsia: Secondary | ICD-10-CM | POA: Diagnosis present

## 2022-08-22 DIAGNOSIS — R110 Nausea: Secondary | ICD-10-CM | POA: Diagnosis present

## 2022-08-22 MED ORDER — TECHNETIUM TC 99M MEBROFENIN IV KIT
5.2000 | PACK | Freq: Once | INTRAVENOUS | Status: AC
  Administered 2022-08-22: 5.2 via INTRAVENOUS

## 2022-08-23 ENCOUNTER — Other Ambulatory Visit: Payer: Self-pay | Admitting: Family Medicine

## 2022-08-24 ENCOUNTER — Other Ambulatory Visit: Payer: Self-pay | Admitting: Family Medicine

## 2022-08-24 DIAGNOSIS — I10 Essential (primary) hypertension: Secondary | ICD-10-CM

## 2022-08-29 NOTE — Addendum Note (Signed)
Addended by: Margaretha Sheffield on: 08/29/2022 01:28 PM   Modules accepted: Orders

## 2022-08-30 ENCOUNTER — Other Ambulatory Visit: Payer: Self-pay | Admitting: Sports Medicine

## 2022-09-18 ENCOUNTER — Ambulatory Visit: Payer: No Typology Code available for payment source | Admitting: Sports Medicine

## 2022-10-02 ENCOUNTER — Encounter: Payer: Self-pay | Admitting: Sports Medicine

## 2022-10-02 ENCOUNTER — Ambulatory Visit: Payer: No Typology Code available for payment source | Admitting: Sports Medicine

## 2022-10-02 DIAGNOSIS — M25521 Pain in right elbow: Secondary | ICD-10-CM | POA: Diagnosis not present

## 2022-10-02 DIAGNOSIS — M7711 Lateral epicondylitis, right elbow: Secondary | ICD-10-CM | POA: Diagnosis not present

## 2022-10-02 NOTE — Progress Notes (Signed)
Carrie Gordon - 52 y.o. female MRN [id number]  Date of birth: 11-04-1970  Office Visit Note: Visit Date: 10/02/2022 PCP: Babs Sciara, MD Referred by: Babs Sciara, MD  Subjective: No chief complaint on file.  HPI: Carrie Gordon is a pleasant 52 y.o. female who presents today for follow-up of right elbow pain with diagnosed LE.  Pain over the lateral epicondyle region for about 2 months.  On 08/20/2022 we did proceed with ultrasound-guided lateral epicondyle injection.  She has been on oral Voltaren 75 mg twice daily for a few weeks.  Also doing home rehab exercises multiple times daily.  Wears her counterforce strap as well.  She did get about 50% relief of her pain after the injection but certainly did not get rid of her pain.  Pain has slowly returned, although still better and less functional status postinjection the pain is still bothersome, up to an 8/10 on average.  Pain is worse with resisted wrist extension prolonged positioning and supination about the wrist. Wanting to play volleyball this summer and is unable to secondary to pain.  Pertinent ROS were reviewed with the patient and found to be negative unless otherwise specified above in HPI.   Assessment & Plan: Visit Diagnoses:  1. Lateral epicondylitis, right elbow   2. Right elbow pain    Plan: Discussed with Carrie Gordon options for her lateral epicondylitis.  She did get about 50% relief from the injection, however the pain is still persistent and preventing her from doing activities she enjoys.  I would have hoped she would have received greater than 50% relief from the injection, since she did not I do think it is pertinent to obtain an MRI to evaluate for any degree of common extensor tendon tearing or any other pathology preventing her healing.  MRI ordered today.  I would like her to continue her home exercise protocol, she may use ice for any pain and swelling in her counterforce strap with activity.   We discussed physical therapy, nitroglycerin patch, extracorporeal shockwave treatment therapy, surgery.  We will await the MRI to greater pathology in determine what steps to take next.  Could also consider PRP injection therapy depending on degree of tearing or inflammation.  She will follow-up in 5 days after MRI scan to review.  Follow-up: Return for folllow-up 5 days after MRI scan to review.   Meds & Orders: No orders of the defined types were placed in this encounter.   Orders Placed This Encounter  Procedures   MR Elbow Right w/o contrast     Procedures: No procedures performed      Clinical History: No specialty comments available.  She reports that she has never smoked. She has never used smokeless tobacco. No results for input(s): "HGBA1C", "LABURIC" in the last 8760 hours.  Objective:   Vital Signs: LMP  (LMP Unknown)   Physical Exam  Gen: Well-appearing, in no acute distress; non-toxic CV: Well-perfused. Warm.  Resp: Breathing unlabored on room air; no wheezing. Psych: Fluid speech in conversation; appropriate affect; normal thought process Neuro: Sensation intact throughout. No gross coordination deficits.   Ortho Exam - Right elbow: + TTP over the lateral epicondyle and the proximal extensor tendon origin.  There is full range of motion of the elbow without mechanical blocks.  Pain with resisted wrist extension, positive Mulder's test, positive third finger resisted extension and pain with resisted supination/pronation.  There is no overlying redness or swelling. No weakness.   Imaging: No  results found.  Past Medical/Family/Surgical/Social History: Medications & Allergies reviewed per EMR, new medications updated. Patient Active Problem List   Diagnosis Date Noted   Primary hypertension 07/02/2021   Near syncope 12/31/2019   Other fatigue 12/31/2019   Anxiety 08/26/2017   Insomnia 12/19/2016   Migraines 12/06/2014   Past Medical History:  Diagnosis Date    Anxiety    Back problem    Bursitis of shoulder, right    Depression    Elevated hemoglobin A1c 2017   Hypertension    Low vitamin D level    Migraines    Uterine fibroid    Family History  Adopted: Yes  Problem Relation Age of Onset   Breast cancer Neg Hx    Past Surgical History:  Procedure Laterality Date   ABDOMINAL HYSTERECTOMY  2010   TAH--Dr. Edward Jolly   MYOMECTOMY  2006   -Dr. Edward Jolly   TUBAL LIGATION  2002   -Dr. Edward Jolly   Social History   Occupational History   Not on file  Tobacco Use   Smoking status: Never   Smokeless tobacco: Never  Vaping Use   Vaping Use: Never used  Substance and Sexual Activity   Alcohol use: Yes    Alcohol/week: 1.0 standard drink of alcohol    Types: 1 Glasses of wine per week   Drug use: No   Sexual activity: Yes    Partners: Male    Birth control/protection: Surgical    Comment: TAH

## 2022-10-11 ENCOUNTER — Encounter: Payer: Self-pay | Admitting: Nurse Practitioner

## 2022-10-19 ENCOUNTER — Ambulatory Visit
Admission: RE | Admit: 2022-10-19 | Discharge: 2022-10-19 | Disposition: A | Discharge status: Home / Self Care | Payer: No Typology Code available for payment source | Source: Ambulatory Visit | Attending: Sports Medicine | Admitting: Sports Medicine

## 2022-10-19 DIAGNOSIS — M25521 Pain in right elbow: Secondary | ICD-10-CM

## 2022-10-19 DIAGNOSIS — M7711 Lateral epicondylitis, right elbow: Secondary | ICD-10-CM

## 2022-10-28 ENCOUNTER — Ambulatory Visit: Payer: No Typology Code available for payment source | Admitting: Sports Medicine

## 2022-10-28 DIAGNOSIS — M7711 Lateral epicondylitis, right elbow: Secondary | ICD-10-CM

## 2022-10-28 DIAGNOSIS — M25521 Pain in right elbow: Secondary | ICD-10-CM | POA: Diagnosis not present

## 2022-10-28 DIAGNOSIS — S56511A Strain of other extensor muscle, fascia and tendon at forearm level, right arm, initial encounter: Secondary | ICD-10-CM

## 2022-10-28 MED ORDER — NITROGLYCERIN 0.2 MG/HR TD PT24: MEDICATED_PATCH | TRANSDERMAL | 1 refills | Status: DC

## 2022-10-28 NOTE — Patient Instructions (Signed)

## 2022-10-28 NOTE — Progress Notes (Signed)
Carrie Gordon - 52 y.o. female MRN [id number]  Date of birth: [birth year]  Office Visit Note: Visit Date: 10/28/2022 PCP: Babs Sciara, MD Referred by: Babs Sciara, MD  Subjective: Chief Complaint  Patient presents with  . Right Elbow - Pain   HPI: Carrie Gordon is a pleasant 52 y.o. female who presents today for acute on chronic right elbow pain.  Pertinent ROS were reviewed with the patient and found to be negative unless otherwise specified above in HPI.   Assessment & Plan: Visit Diagnoses: No diagnosis found.  Plan: ***  Follow-up: Return in about 1 week (around 11/04/2022) for f/u in 1-week for lateral epicondylitis.   Meds & Orders: No orders of the defined types were placed in this encounter.  No orders of the defined types were placed in this encounter.    Procedures: No procedures performed      Clinical History: No specialty comments available.  She reports that she has never smoked. She has never used smokeless tobacco. No results for input(s): "HGBA1C", "LABURIC" in the last 8760 hours.  Objective:   Vital Signs: LMP  (LMP Unknown)   Physical Exam  Gen: Well-appearing, in no acute distress; non-toxic CV: Regular Rate. Well-perfused. Warm.  Resp: Breathing unlabored on room air; no wheezing. Psych: Fluid speech in conversation; appropriate affect; normal thought process Neuro: Sensation intact throughout. No gross coordination deficits.   Ortho Exam - ***  Imaging: No results found.  Past Medical/Family/Surgical/Social History: Medications & Allergies reviewed per EMR, new medications updated. Patient Active Problem List   Diagnosis Date Noted  . Primary hypertension 07/02/2021  . Near syncope 12/31/2019  . Other fatigue 12/31/2019  . Anxiety 08/26/2017  . Insomnia 12/19/2016  . Migraines 12/06/2014   Past Medical History:  Diagnosis Date  . Anxiety   . Back problem   . Bursitis of shoulder, right   .  Depression   . Elevated hemoglobin A1c 2017  . Hypertension   . Low vitamin D level   . Migraines   . Uterine fibroid    Family History  Adopted: Yes  Problem Relation Age of Onset  . Breast cancer Neg Hx    Past Surgical History:  Procedure Laterality Date  . ABDOMINAL HYSTERECTOMY  2010   TAH--Dr. Edward Jolly  . MYOMECTOMY  2006   -Dr. Edward Jolly  . TUBAL LIGATION  2002   -Dr. Edward Jolly   Social History   Occupational History  . Not on file  Tobacco Use  . Smoking status: Never  . Smokeless tobacco: Never  Vaping Use  . Vaping Use: Never used  Substance and Sexual Activity  . Alcohol use: Yes    Alcohol/week: 1.0 standard drink of alcohol    Types: 1 Glasses of wine per week  . Drug use: No  . Sexual activity: Yes    Partners: Male    Birth control/protection: Surgical    Comment: TAH

## 2022-10-29 ENCOUNTER — Encounter: Payer: Self-pay | Admitting: Sports Medicine

## 2022-11-05 ENCOUNTER — Other Ambulatory Visit: Payer: Self-pay

## 2022-11-05 ENCOUNTER — Encounter: Payer: Self-pay | Admitting: Physical Therapy

## 2022-11-05 ENCOUNTER — Ambulatory Visit: Payer: No Typology Code available for payment source | Admitting: Physical Therapy

## 2022-11-05 DIAGNOSIS — M25521 Pain in right elbow: Secondary | ICD-10-CM

## 2022-11-05 DIAGNOSIS — M6281 Muscle weakness (generalized): Secondary | ICD-10-CM | POA: Diagnosis not present

## 2022-11-05 DIAGNOSIS — M79601 Pain in right arm: Secondary | ICD-10-CM | POA: Diagnosis not present

## 2022-11-05 DIAGNOSIS — R29898 Other symptoms and signs involving the musculoskeletal system: Secondary | ICD-10-CM

## 2022-11-05 NOTE — Therapy (Signed)
OUTPATIENT PHYSICAL THERAPY EVALUATION   Patient Name: Carrie Gordon MRN: [id number] DOB:[birth year], 52 y.o., female Today's Date: 11/05/2022  END OF SESSION:  PT End of Session - 11/05/22 0847     Visit Number 1    Number of Visits 6    Date for PT Re-Evaluation 12/17/22    Authorization Type [employer] 20% coinsurance    PT Start Time 0800    PT Stop Time 0835    PT Time Calculation (min) 35 min    Activity Tolerance Patient tolerated treatment well    Behavior During Therapy [facility] for tasks assessed/performed             Past Medical History:  Diagnosis Date   Anxiety    Back problem    Bursitis of shoulder, right    Depression    Elevated hemoglobin A1c 2017   Hypertension    Low vitamin D level    Migraines    Uterine fibroid    Past Surgical History:  Procedure Laterality Date   ABDOMINAL HYSTERECTOMY  2010   TAH--Dr. Edward Jolly   MYOMECTOMY  2006   -Dr. Edward Jolly   TUBAL LIGATION  2002   -Dr. Edward Jolly   Patient Active Problem List   Diagnosis Date Noted   Primary hypertension 07/02/2021   Near syncope 12/31/2019   Other fatigue 12/31/2019   Anxiety 08/26/2017   Insomnia 12/19/2016   Migraines 12/06/2014    PCP: Babs Sciara, MD  REFERRING PROVIDER: Madelyn Brunner, DO  REFERRING DIAG: M77.11 (ICD-10-CM) - Lateral epicondylitis, right elbow  Rationale for Evaluation and Treatment: Rehabilitation  THERAPY DIAG:  Pain in right elbow - Plan: PT plan of care cert/re-cert  Pain in right arm - Plan: PT plan of care cert/re-cert  Muscle weakness (generalized) - Plan: PT plan of care cert/re-cert  Other symptoms and signs involving the musculoskeletal system - Plan: PT plan of care cert/re-cert  ONSET DATE: Mid March 2024   SUBJECTIVE:                                                                                                                                                                                           SUBJECTIVE STATEMENT: Pt with  sudden onset of Rt elbow pain about 3 months ago without known injury.  She does a lot of computer work and works at home since 2020.  She had a steroid injection which didn't seem to help, and will be starting shockwave therapy tomorrow.    PERTINENT HISTORY:  HTN, anxiety, migraines, depression  PAIN:  Are you having pain? Yes: NPRS scale: 0 currently, up to 9/10 Pain  location: Rt elbow Pain description: shooting into forearm, occasionally up the arm Aggravating factors: picking up items, pulling and reaching for items Relieving factors: movement, repositioning  PRECAUTIONS:  None  WEIGHT BEARING RESTRICTIONS:  No  FALLS:  Has patient fallen in last 6 months? 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[employer]  PLOF:  Independent and Leisure: walking 4 miles in the AM (hasn't recently)  PATIENT GOALS:  Improve pain, wants to play volleyball  NEXT MD VISIT: 11/05/22   OBJECTIVE:   DIAGNOSTIC FINDINGS:  MRI: tendinosis with a partial-thickness tear near the extensor tendon origin  PATIENT SURVEYS:  11/05/22: FOTO 51 (predicted 69)  COGNITIVE STATUS: Within functional limits for tasks assessed    SENSATION: WFL  POSTURE:  rounded shoulders and forward head  HAND DOMINANCE:  Right  GAIT: 11/05/22 Comments: independetn   Elbow/Wrist  PALPATION: 11/05/22: tender with trigger points to Rt wrist extensors, supinator and distal triceps  UPPER EXTREMITY ROM: ROM WNL except wrist extension limited ~ 25%  UPPER EXTREMITY MMT:  MMT Right eval Left eval  Elbow flexion 5/5   Elbow extension 4/5   Wrist flexion    Wrist extension 4/5 with pain   Wrist ulnar deviation    Wrist radial deviation    Wrist pronation 4/5   Wrist supination 4/5 with pain   Grip strength 36.7# 54.5#   (Blank rows = not tested)    TREATMENT:                                                                                                                               DATE:  11/05/22  TherEx See HEP - demonstrated exercises for pt, min cues needed  Manual STM with compression to Rt wrist extensors; skilled palpation and monitoring of soft tissue during DN Trigger Point Dry-Needling  Treatment instructions: Expect mild to moderate muscle soreness. S/S of pneumothorax if dry needled over a lung field, and to seek immediate medical attention should they occur. Patient verbalized understanding of these instructions and education.  Patient Consent Given: Yes Education handout provided: Yes Muscles treated: Rt wrist extensors Electrical stimulation performed: No Parameters: N/A Treatment response/outcome: twitch response, increased soreness reported  PATIENT EDUCATION:  Education details: HEP, DN Person educated: Patient Education method: Explanation, Demonstration, and Handouts Education comprehension: verbalized understanding, returned demonstration, and needs further education  HOME EXERCISE PROGRAM: Access Code: [email or web address] URL: [email or web address] Date: 11/05/2022 Prepared by: Moshe Cipro  Exercises - Standing Wrist Flexion Stretch  - 2 x daily - 7 x weekly - 1 sets - 3 reps - 30 sec hold - Seated Eccentric Wrist Extension  - 2 x daily - 7 x weekly - 2-3 sets - 10 reps - Seated Wrist Supination Pronation with Can  - 2 x daily - 7 x weekly - 2-3 sets - 10 reps  Patient Education - Trigger Point Dry Needling  ASSESSMENT:  CLINICAL IMPRESSION: Patient is a 52 y.o. female who was seen today for physical therapy evaluation and treatment for Rt elbow pain consistent with lateral epicondylitis. She demonstrates decreased ROM and strength with continued pain affecting functional mobility.  She will benefit from PT to address deficits listed.    OBJECTIVE IMPAIRMENTS: decreased ROM, decreased strength, increased fascial restrictions, increased muscle spasms, impaired  UE functional use, and pain.   ACTIVITY LIMITATIONS: carrying, lifting, toileting, dressing, self feeding, reach over head, and hygiene/grooming  PARTICIPATION LIMITATIONS: meal prep, cleaning, laundry, shopping, community activity, and occupation  PERSONAL FACTORS: 3+ comorbidities: HTN, anxiety, migraines, depression  are also affecting patient's functional outcome.   REHAB POTENTIAL: Good  CLINICAL DECISION MAKING: Stable/uncomplicated  EVALUATION COMPLEXITY: Low   GOALS: Goals reviewed with patient? Yes  SHORT TERM GOALS: Target date: 11/26/2022  Independent with initial HEP Goal status: INITIAL  LONG TERM GOALS: Target date: 12/17/2022  Independent with final HEP Goal status: INITIAL  2.  FOTO score improved to 69 Goal status: INITIAL  3.  Rt grip strength improved to at least 45# for improved strength and function Goal status: INIITAL  4.  Report pain < 3/10 with work activities for improved function Goal status: INITIAL    PLAN:  PT FREQUENCY: 1-2x/week  PT DURATION: 6 weeks  PLANNED INTERVENTIONS: Therapeutic exercises, Therapeutic activity, Neuromuscular re-education, Patient/Family education, Self Care, Joint mobilization, Aquatic Therapy, Dry Needling, Electrical stimulation, Cryotherapy, Moist heat, scar mobilization, Taping, Ultrasound, Ionotophoresis 4mg /ml Dexamethasone, Manual therapy, and Re-evaluation.  PLAN FOR NEXT SESSION: review HEP, assess response to DN and repeat PRN, consider ionto   Clarita Crane, PT, DPT 11/05/22 9:37 AM

## 2022-11-06 ENCOUNTER — Ambulatory Visit: Payer: No Typology Code available for payment source | Admitting: Sports Medicine

## 2022-11-06 ENCOUNTER — Encounter: Payer: Self-pay | Admitting: Sports Medicine

## 2022-11-06 DIAGNOSIS — S56511A Strain of other extensor muscle, fascia and tendon at forearm level, right arm, initial encounter: Secondary | ICD-10-CM

## 2022-11-06 DIAGNOSIS — M7711 Lateral epicondylitis, right elbow: Secondary | ICD-10-CM

## 2022-11-06 NOTE — Progress Notes (Signed)
Carrie Gordon - 52 y.o. female MRN [id number]  Date of birth: 05-11-1971  Office Visit Note: Visit Date: 11/06/2022 PCP: Babs Sciara, MD Referred by: Babs Sciara, MD  Subjective: Chief Complaint  Patient presents with   Right Elbow - Follow-up   HPI: Carrie Gordon is a pleasant 52 y.o. female who presents today for Chronic right elbow pain with lateral epicondylitis and a small partial tear of common extensor tendon.  Still having pain over the elbow, it is better than last week.  Tolerated 1 session of extracorporeal shockwave therapy, unsure the exact amount of pain relief at this time.  Did have her first physical therapy session yesterday, did do dry needling.  She continues on her nitroglycerin patch, changing once daily. Doing HEP as well.  Pertinent ROS were reviewed with the patient and found to be negative unless otherwise specified above in HPI.   Assessment & Plan: Visit Diagnoses:  1. Lateral epicondylitis, right elbow   2. Partial tear of common extensor tendon of right elbow    Plan: We did repeat a second trial of extracorporeal shockwave therapy for Carrie Gordon today, she will send me a MyChart message early next week to see what degree of improvement she is receiving.  If she is about 20% improved or more we will consider additional treatment options.  If not, we will hold off on further shockwave therapy sessions and have her continue formalized physical therapy as well as nitroglycerin patch to augment healing.  She will continue her home exercises as well.  I will wait to hear from her for MyChart messaging. Continue nitroglycerin patch 0.2 mg/h every 24 hours.  Follow-up: Return for she will message me next week for update.   Meds & Orders: No orders of the defined types were placed in this encounter.  No orders of the defined types were placed in this encounter.    Procedures: Procedure: ECSWT Indications:  Lateral epicondylitis,  Tendinosis   Procedure Details Consent: Risks of procedure as well as the alternatives and risks of each were explained to the patient.  Verbal consent for procedure obtained. Time Out: Verified patient identification, verified procedure, site was marked, verified correct patient position. The area was cleaned with alcohol swab.     The Right lateral epicondyle and common extensor tendon was targeted for Extracorporeal shockwave therapy.    Preset: Lateral Epicondylitis Power Level: 120 mJ Frequency: 14 Hz Impulse/cycles: 3000 (then 200 directly over LE on lower settting) Head size: Regular   Patient tolerated procedure well without immediate complications.      Clinical History: No specialty comments available.  She reports that she has never smoked. She has never used smokeless tobacco. No results for input(s): "HGBA1C", "LABURIC" in the last 8760 hours.  Objective:   Vital Signs: LMP  (LMP Unknown)   Physical Exam  Gen: Well-appearing, in no acute distress; non-toxic CV:  Well-perfused. Warm.  Resp: Breathing unlabored on room air; no wheezing. Psych: Fluid speech in conversation; appropriate affect; normal thought process Neuro: Sensation intact throughout. No gross coordination deficits.   Ortho Exam - Right elbow: + TTP over the lateral epicondyle and just distal to its origin of the common extensor tendons.  No swelling or redness.  Imaging: No results found.  Past Medical/Family/Surgical/Social History: Medications & Allergies reviewed per EMR, new medications updated. Patient Active Problem List   Diagnosis Date Noted   Primary hypertension 07/02/2021   Near syncope 12/31/2019   Other fatigue  12/31/2019   Anxiety 08/26/2017   Insomnia 12/19/2016   Migraines 12/06/2014   Past Medical History:  Diagnosis Date   Anxiety    Back problem    Bursitis of shoulder, right    Depression    Elevated hemoglobin A1c 2017   Hypertension    Low vitamin D level     Migraines    Uterine fibroid    Family History  Adopted: Yes  Problem Relation Age of Onset   Breast cancer Neg Hx    Past Surgical History:  Procedure Laterality Date   ABDOMINAL HYSTERECTOMY  2010   TAH--Dr. Edward Jolly   MYOMECTOMY  2006   -Dr. Edward Jolly   TUBAL LIGATION  2002   -Dr. Edward Jolly   Social History   Occupational History   Not on file  Tobacco Use   Smoking status: Never   Smokeless tobacco: Never  Vaping Use   Vaping Use: Never used  Substance and Sexual Activity   Alcohol use: Yes    Alcohol/week: 1.0 standard drink of alcohol    Types: 1 Glasses of wine per week   Drug use: No   Sexual activity: Yes    Partners: Male    Birth control/protection: Surgical    Comment: TAH

## 2022-11-06 NOTE — Progress Notes (Signed)
Doing better; states it feels good today

## 2022-11-12 ENCOUNTER — Encounter: Payer: No Typology Code available for payment source | Admitting: Physical Therapy

## 2022-11-18 ENCOUNTER — Encounter: Payer: No Typology Code available for payment source | Admitting: Physical Therapy

## 2022-11-19 ENCOUNTER — Ambulatory Visit: Payer: No Typology Code available for payment source | Admitting: Physical Therapy

## 2022-11-19 ENCOUNTER — Encounter: Payer: No Typology Code available for payment source | Admitting: Physical Therapy

## 2022-11-19 ENCOUNTER — Encounter: Payer: Self-pay | Admitting: Physical Therapy

## 2022-11-19 DIAGNOSIS — R29898 Other symptoms and signs involving the musculoskeletal system: Secondary | ICD-10-CM | POA: Diagnosis not present

## 2022-11-19 DIAGNOSIS — M25521 Pain in right elbow: Secondary | ICD-10-CM

## 2022-11-19 DIAGNOSIS — M79601 Pain in right arm: Secondary | ICD-10-CM | POA: Diagnosis not present

## 2022-11-19 DIAGNOSIS — M6281 Muscle weakness (generalized): Secondary | ICD-10-CM | POA: Diagnosis not present

## 2022-11-19 NOTE — Therapy (Signed)
OUTPATIENT PHYSICAL THERAPY TREATMENT   Patient Name: Carrie Gordon MRN: [id number] DOB:1971/01/20, 52 y.o., female Today's Date: 11/19/2022  END OF SESSION:  PT End of Session - 11/19/22 0756     Visit Number 2    Number of Visits 6    Date for PT Re-Evaluation 12/17/22    Authorization Type [employer] 20% coinsurance    PT Start Time 0800    PT Stop Time 0827    PT Time Calculation (min) 27 min    Activity Tolerance Patient tolerated treatment well    Behavior During Therapy [facility] for tasks assessed/performed              Past Medical History:  Diagnosis Date   Anxiety    Back problem    Bursitis of shoulder, right    Depression    Elevated hemoglobin A1c 2017   Hypertension    Low vitamin D level    Migraines    Uterine fibroid    Past Surgical History:  Procedure Laterality Date   ABDOMINAL HYSTERECTOMY  2010   TAH--Dr. Edward Jolly   MYOMECTOMY  2006   -Dr. Edward Jolly   TUBAL LIGATION  2002   -Dr. Edward Jolly   Patient Active Problem List   Diagnosis Date Noted   Primary hypertension 07/02/2021   Near syncope 12/31/2019   Other fatigue 12/31/2019   Anxiety 08/26/2017   Insomnia 12/19/2016   Migraines 12/06/2014    PCP: Babs Sciara, MD  REFERRING PROVIDER: Madelyn Brunner, DO  REFERRING DIAG: M77.11 (ICD-10-CM) - Lateral epicondylitis, right elbow  Rationale for Evaluation and Treatment: Rehabilitation  THERAPY DIAG:  Pain in right elbow  Pain in right arm  Muscle weakness (generalized)  Other symptoms and signs involving the musculoskeletal system  ONSET DATE: Mid March 2024   SUBJECTIVE:                                                                                                                                                                                           SUBJECTIVE STATEMENT: Pain is less; still gets occasional shooting pain.  Cold air is really painful and sensitive to the elbow.  Shockwave therapy has been helpful, reporting  about 50% improvement  EVAL: Pt with sudden onset of Rt elbow pain about 3 months ago without known injury.  She does a lot of computer work and works at home since 2020.  She had a steroid injection which didn't seem to help, and will be starting shockwave therapy tomorrow.    PERTINENT HISTORY:  HTN, anxiety, migraines, depression  PAIN:  Are you having pain? Yes: NPRS scale: 0 currently,  up to 5-6/10 Pain location: Rt elbow Pain description: shooting into forearm, occasionally up the arm Aggravating factors: picking up items, pulling and reaching for items Relieving factors: movement, repositioning  PRECAUTIONS:  None  WEIGHT BEARING RESTRICTIONS:  No  FALLS:  Has patient fallen in last 6 months? 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[employer]  PLOF:  Independent and Leisure: walking 4 miles in the AM (hasn't recently)  PATIENT GOALS:  Improve pain, wants to play volleyball  NEXT MD VISIT: 11/05/22   OBJECTIVE:   DIAGNOSTIC FINDINGS:  MRI: tendinosis with a partial-thickness tear near the extensor tendon origin  PATIENT SURVEYS:  11/05/22: FOTO 51 (predicted 69)  COGNITIVE STATUS: Within functional limits for tasks assessed    SENSATION: WFL  POSTURE:  rounded shoulders and forward head  HAND DOMINANCE:  Right  GAIT: 11/05/22 Comments: independetn   Elbow/Wrist  PALPATION: 11/05/22: tender with trigger points to Rt wrist extensors, supinator and distal triceps  UPPER EXTREMITY ROM: ROM WNL except wrist extension limited ~ 25%  UPPER EXTREMITY MMT:  MMT Right eval Left eval  Elbow flexion 5/5   Elbow extension 4/5   Wrist flexion    Wrist extension 4/5 with pain   Wrist ulnar deviation    Wrist radial deviation    Wrist pronation 4/5   Wrist supination 4/5 with pain   Grip strength 36.7# 54.5#   (Blank rows = not tested)    TREATMENT:                                                                                                                               DATE:  11/19/22 Manual STM with compression and percussive device to Rt wrist extensors and triceps; skilled palpation and monitoring of soft tissue during DN Trigger Point Dry-Needling  Treatment instructions: Expect mild to moderate muscle soreness. S/S of pneumothorax if dry needled over a lung field, and to seek immediate medical attention should they occur. Patient verbalized understanding of these instructions and education.  Patient Consent Given: Yes Education handout provided: Yes Muscles treated: Rt wrist extensors, distal triceps  Electrical stimulation performed: No Parameters: N/A Treatment response/outcome: twitch response, increased soreness reported  TherEx Seated wrist extensor stretch 2x20 sec Ulnar nerve stretch 2x10 sec  TherAct Discussed work station set up and specifically mouse.  Encouraged modifications to limit repeated wrist extension - pt verbalized understanding  11/05/22  TherEx See HEP - demonstrated exercises for pt, min cues needed  Manual STM with compression to Rt wrist extensors; skilled palpation and monitoring of soft tissue during DN Trigger Point Dry-Needling  Treatment instructions: Expect mild to moderate muscle soreness. S/S of pneumothorax if dry needled over a lung field, and to seek immediate medical attention should they occur. Patient verbalized understanding of these instructions and education.  Patient Consent Given: Yes Education handout provided: Yes Muscles treated: Rt wrist  extensors Electrical stimulation performed: No Parameters: N/A Treatment response/outcome: twitch response, increased soreness reported  PATIENT EDUCATION:  Education details: HEP, DN Person educated: Patient Education method: Explanation, Demonstration, and Handouts Education comprehension: verbalized understanding, returned demonstration, and needs  further education  HOME EXERCISE PROGRAM: Access Code: [email or web address] URL: [email or web address] Date: 11/19/2022 Prepared by: Moshe Cipro  Exercises - Standing Wrist Flexion Stretch  - 2 x daily - 7 x weekly - 1 sets - 3 reps - 30 sec hold - Seated Eccentric Wrist Extension  - 2 x daily - 7 x weekly - 2-3 sets - 10 reps - Seated Wrist Supination Pronation with Can  - 2 x daily - 7 x weekly - 2-3 sets - 10 reps - Ulnar Nerve Flossing  - 1 x daily - 7 x weekly - 1 sets - 10 reps - 3-5 sec hold - Ulnar Nerve Flossing  - 1 x daily - 7 x weekly - 1 sets - 10 reps - 3-5 sec hold  Patient Education - Trigger Point Dry Needling   ASSESSMENT:  CLINICAL IMPRESSION: Pt with initially positive response to DN and HEP at this time.  She does seem to have some nerve pain so provided some additional exercises today.  Will continue to benefit from PT to maximize function.  Pain improved nearly 50% to date.   OBJECTIVE IMPAIRMENTS: decreased ROM, decreased strength, increased fascial restrictions, increased muscle spasms, impaired UE functional use, and pain.   ACTIVITY LIMITATIONS: carrying, lifting, toileting, dressing, self feeding, reach over head, and hygiene/grooming  PARTICIPATION LIMITATIONS: meal prep, cleaning, laundry, shopping, community activity, and occupation  PERSONAL FACTORS: 3+ comorbidities: HTN, anxiety, migraines, depression  are also affecting patient's functional outcome.   REHAB POTENTIAL: Good  CLINICAL DECISION MAKING: Stable/uncomplicated  EVALUATION COMPLEXITY: Low   GOALS: Goals reviewed with patient? Yes  SHORT TERM GOALS: Target date: 11/26/2022  Independent with initial HEP Goal status: INITIAL  LONG TERM GOALS: Target date: 12/17/2022  Independent with final HEP Goal status: INITIAL  2.  FOTO score improved to 69 Goal status: INITIAL  3.  Rt grip strength improved to at least 45# for improved strength and function Goal status:  INIITAL  4.  Report pain < 3/10 with work activities for improved function Goal status: INITIAL    PLAN:  PT FREQUENCY: 1-2x/week  PT DURATION: 6 weeks  PLANNED INTERVENTIONS: Therapeutic exercises, Therapeutic activity, Neuromuscular re-education, Patient/Family education, Self Care, Joint mobilization, Aquatic Therapy, Dry Needling, Electrical stimulation, Cryotherapy, Moist heat, scar mobilization, Taping, Ultrasound, Ionotophoresis 4mg /ml Dexamethasone, Manual therapy, and Re-evaluation.  PLAN FOR NEXT SESSION:  assess response to DN and repeat PRN, consider ionto   Clarita Crane, PT, DPT 11/19/22 8:33 AM

## 2022-11-26 ENCOUNTER — Ambulatory Visit: Payer: No Typology Code available for payment source | Admitting: Physical Therapy

## 2022-11-26 ENCOUNTER — Encounter: Payer: Self-pay | Admitting: Physical Therapy

## 2022-11-26 DIAGNOSIS — M25521 Pain in right elbow: Secondary | ICD-10-CM | POA: Diagnosis not present

## 2022-11-26 DIAGNOSIS — M6281 Muscle weakness (generalized): Secondary | ICD-10-CM | POA: Diagnosis not present

## 2022-11-26 DIAGNOSIS — R29898 Other symptoms and signs involving the musculoskeletal system: Secondary | ICD-10-CM | POA: Diagnosis not present

## 2022-11-26 DIAGNOSIS — M79601 Pain in right arm: Secondary | ICD-10-CM | POA: Diagnosis not present

## 2022-11-26 NOTE — Therapy (Signed)
OUTPATIENT PHYSICAL THERAPY TREATMENT   Patient Name: Carrie Gordon MRN: [id number] DOB:1970/12/14, 52 y.o., female Today's Date: 11/26/2022  END OF SESSION:  PT End of Session - 11/26/22 0803     Visit Number 3    Number of Visits 6    Date for PT Re-Evaluation 12/17/22    Authorization Type [employer] 20% coinsurance    PT Start Time 0800    PT Stop Time 0825    PT Time Calculation (min) 25 min    Activity Tolerance Patient tolerated treatment well    Behavior During Therapy [facility] for tasks assessed/performed               Past Medical History:  Diagnosis Date   Anxiety    Back problem    Bursitis of shoulder, right    Depression    Elevated hemoglobin A1c 2017   Hypertension    Low vitamin D level    Migraines    Uterine fibroid    Past Surgical History:  Procedure Laterality Date   ABDOMINAL HYSTERECTOMY  2010   TAH--Dr. Edward Jolly   MYOMECTOMY  2006   -Dr. Edward Jolly   TUBAL LIGATION  2002   -Dr. Edward Jolly   Patient Active Problem List   Diagnosis Date Noted   Primary hypertension 07/02/2021   Near syncope 12/31/2019   Other fatigue 12/31/2019   Anxiety 08/26/2017   Insomnia 12/19/2016   Migraines 12/06/2014    PCP: Babs Sciara, MD  REFERRING PROVIDER: Madelyn Brunner, DO  REFERRING DIAG: M77.11 (ICD-10-CM) - Lateral epicondylitis, right elbow  Rationale for Evaluation and Treatment: Rehabilitation  THERAPY DIAG:  Pain in right elbow  Pain in right arm  Muscle weakness (generalized)  Other symptoms and signs involving the musculoskeletal system  ONSET DATE: Mid March 2024   SUBJECTIVE:                                                                                                                                                                                           SUBJECTIVE STATEMENT: "Getting better and better."  Reports ~ 75% improvement  EVAL: Pt with sudden onset of Rt elbow pain about 3 months ago without known injury.  She  does a lot of computer work and works at home since 2020.  She had a steroid injection which didn't seem to help, and will be starting shockwave therapy tomorrow.    PERTINENT HISTORY:  HTN, anxiety, migraines, depression  PAIN:  Are you having pain? Yes: NPRS scale: 0 currently, up to 5-6/10 Pain location: Rt elbow Pain description: shooting into forearm, occasionally up the arm Aggravating factors: picking  up items, pulling and reaching for items Relieving factors: movement, repositioning  PRECAUTIONS:  None  WEIGHT BEARING RESTRICTIONS:  No  FALLS:  Has patient fallen in last 6 months? 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[employer]  PLOF:  Independent and Leisure: walking 4 miles in the AM (hasn't recently)  PATIENT GOALS:  Improve pain, wants to play volleyball  NEXT MD VISIT: 11/05/22   OBJECTIVE:   DIAGNOSTIC FINDINGS:  MRI: tendinosis with a partial-thickness tear near the extensor tendon origin  PATIENT SURVEYS:  11/05/22: FOTO 51 (predicted 69)  COGNITIVE STATUS: Within functional limits for tasks assessed    SENSATION: WFL  POSTURE:  rounded shoulders and forward head  HAND DOMINANCE:  Right  GAIT: 11/05/22 Comments: independetn   Elbow/Wrist  PALPATION: 11/05/22: tender with trigger points to Rt wrist extensors, supinator and distal triceps  UPPER EXTREMITY ROM: ROM WNL except wrist extension limited ~ 25%  UPPER EXTREMITY MMT:  MMT Right eval Left eval Right 11/26/22  Elbow flexion 5/5    Elbow extension 4/5    Wrist flexion     Wrist extension 4/5 with pain    Wrist ulnar deviation     Wrist radial deviation     Wrist pronation 4/5    Wrist supination 4/5 with pain    Grip strength 36.7# 54.5# 41.2#   (Blank rows = not tested)    TREATMENT:                                                                                                                               DATE:  11/26/22 Manual STM with compression and percussive device to Rt triceps; skilled palpation and monitoring of soft tissue during DN Trigger Point Dry-Needling  Treatment instructions: Expect mild to moderate muscle soreness. S/S of pneumothorax if dry needled over a lung field, and to seek immediate medical attention should they occur. Patient verbalized understanding of these instructions and education.  Patient Consent Given: Yes Education handout provided: Yes Muscles treated: Rt distal triceps  Electrical stimulation performed: yes Parameters: 8-10 mA freq with intensity to tolerance x 5 min Treatment response/outcome: twitch response, increased soreness reported  TherEx Demonstrated tricep stretch as well as tricep extension with dumbbells - added to HEP  11/19/22 Manual STM with compression and percussive device to Rt wrist extensors and triceps; skilled palpation and monitoring of soft tissue during DN Trigger Point Dry-Needling  Treatment instructions: Expect mild to moderate muscle soreness. S/S of pneumothorax if dry needled over a lung field, and to seek immediate medical attention should they occur. Patient verbalized understanding of these instructions and education.  Patient Consent Given: Yes Education handout provided: Yes Muscles treated: Rt wrist extensors, distal triceps  Electrical stimulation performed: No Parameters: N/A Treatment response/outcome: twitch response, increased soreness reported  TherEx Seated wrist extensor stretch 2x20 sec Ulnar nerve stretch 2x10 sec  TherAct Discussed work station set up and specifically mouse.  Encouraged modifications to limit repeated wrist extension - pt verbalized understanding  11/05/22  TherEx See HEP - demonstrated exercises for pt, min cues needed  Manual STM with compression to Rt wrist extensors; skilled palpation and monitoring of soft tissue during DN Trigger Point Dry-Needling   Treatment instructions: Expect mild to moderate muscle soreness. S/S of pneumothorax if dry needled over a lung field, and to seek immediate medical attention should they occur. Patient verbalized understanding of these instructions and education.  Patient Consent Given: Yes Education handout provided: Yes Muscles treated: Rt wrist extensors Electrical stimulation performed: No Parameters: N/A Treatment response/outcome: twitch response, increased soreness reported  PATIENT EDUCATION:  Education details: HEP, DN Person educated: Patient Education method: Explanation, Demonstration, and Handouts Education comprehension: verbalized understanding, returned demonstration, and needs further education  HOME EXERCISE PROGRAM: Access Code: [email or web address] URL: [email or web address] Date: 11/19/2022 Prepared by: Moshe Cipro  Exercises - Standing Wrist Flexion Stretch  - 2 x daily - 7 x weekly - 1 sets - 3 reps - 30 sec hold - Seated Eccentric Wrist Extension  - 2 x daily - 7 x weekly - 2-3 sets - 10 reps - Seated Wrist Supination Pronation with Can  - 2 x daily - 7 x weekly - 2-3 sets - 10 reps - Ulnar Nerve Flossing  - 1 x daily - 7 x weekly - 1 sets - 10 reps - 3-5 sec hold - Ulnar Nerve Flossing  - 1 x daily - 7 x weekly - 1 sets - 10 reps - 3-5 sec hold  Patient Education - Trigger Point Dry Needling   ASSESSMENT:  CLINICAL IMPRESSION: Pt with continued reports of improvement in symptoms, with improved grip strength noted today in Rt.  Progressing well with PT and may be ready to hold after next visit.  Trial of estim today with DN to see if this helps..   OBJECTIVE IMPAIRMENTS: decreased ROM, decreased strength, increased fascial restrictions, increased muscle spasms, impaired UE functional use, and pain.   ACTIVITY LIMITATIONS: carrying, lifting, toileting, dressing, self feeding, reach over head, and hygiene/grooming  PARTICIPATION LIMITATIONS: meal prep,  cleaning, laundry, shopping, community activity, and occupation  PERSONAL FACTORS: 3+ comorbidities: HTN, anxiety, migraines, depression  are also affecting patient's functional outcome.   REHAB POTENTIAL: Good  CLINICAL DECISION MAKING: Stable/uncomplicated  EVALUATION COMPLEXITY: Low   GOALS: Goals reviewed with patient? Yes  SHORT TERM GOALS: Target date: 11/26/2022  Independent with initial HEP Goal status: INITIAL  LONG TERM GOALS: Target date: 12/17/2022  Independent with final HEP Goal status: INITIAL  2.  FOTO score improved to 69 Goal status: INITIAL  3.  Rt grip strength improved to at least 45# for improved strength and function Goal status: INIITAL  4.  Report pain < 3/10 with work activities for improved function Goal status: INITIAL    PLAN:  PT FREQUENCY: 1-2x/week  PT DURATION: 6 weeks  PLANNED INTERVENTIONS: Therapeutic exercises, Therapeutic activity, Neuromuscular re-education, Patient/Family education, Self Care, Joint mobilization, Aquatic Therapy, Dry Needling, Electrical stimulation, Cryotherapy, Moist heat, scar mobilization, Taping, Ultrasound, Ionotophoresis 4mg /ml Dexamethasone, Manual therapy, and Re-evaluation.  PLAN FOR NEXT SESSION:  ready for hold?, assess response to DN and repeat PRN, consider ionto   Clarita Crane, PT, DPT 11/26/22 8:30 AM

## 2022-12-03 ENCOUNTER — Ambulatory Visit: Payer: No Typology Code available for payment source | Admitting: Physical Therapy

## 2022-12-03 ENCOUNTER — Encounter: Payer: Self-pay | Admitting: Physical Therapy

## 2022-12-03 DIAGNOSIS — M6281 Muscle weakness (generalized): Secondary | ICD-10-CM | POA: Diagnosis not present

## 2022-12-03 DIAGNOSIS — M25521 Pain in right elbow: Secondary | ICD-10-CM | POA: Diagnosis not present

## 2022-12-03 DIAGNOSIS — M79601 Pain in right arm: Secondary | ICD-10-CM | POA: Diagnosis not present

## 2022-12-03 DIAGNOSIS — R29898 Other symptoms and signs involving the musculoskeletal system: Secondary | ICD-10-CM

## 2022-12-03 NOTE — Therapy (Addendum)
OUTPATIENT PHYSICAL THERAPY TREATMENT / DISCHARGE   Patient Name: Carrie Gordon MRN: [id number] DOB:11-14-1970, 52 y.o., female Today's Date: 12/03/2022  END OF SESSION:  PT End of Session - 12/03/22 0803     Visit Number 4    Number of Visits 6    Date for PT Re-Evaluation 12/17/22    Authorization Type [employer] 20% coinsurance    PT Start Time 0800    PT Stop Time 0819    PT Time Calculation (min) 19 min    Activity Tolerance Patient tolerated treatment well    Behavior During Therapy [facility] for tasks assessed/performed                Past Medical History:  Diagnosis Date   Anxiety    Back problem    Bursitis of shoulder, right    Depression    Elevated hemoglobin A1c 2017   Hypertension    Low vitamin D level    Migraines    Uterine fibroid    Past Surgical History:  Procedure Laterality Date   ABDOMINAL HYSTERECTOMY  2010   TAH--Dr. Edward Jolly   MYOMECTOMY  2006   -Dr. Edward Jolly   TUBAL LIGATION  2002   -Dr. Edward Jolly   Patient Active Problem List   Diagnosis Date Noted   Primary hypertension 07/02/2021   Near syncope 12/31/2019   Other fatigue 12/31/2019   Anxiety 08/26/2017   Insomnia 12/19/2016   Migraines 12/06/2014    PCP: Babs Sciara, MD  REFERRING PROVIDER: Madelyn Brunner, DO  REFERRING DIAG: M77.11 (ICD-10-CM) - Lateral epicondylitis, right elbow  Rationale for Evaluation and Treatment: Rehabilitation  THERAPY DIAG:  Pain in right elbow  Pain in right arm  Muscle weakness (generalized)  Other symptoms and signs involving the musculoskeletal system  ONSET DATE: Mid March 2024   SUBJECTIVE:                                                                                                                                                                                           SUBJECTIVE STATEMENT: Continues to feel better  EVAL: Pt with sudden onset of Rt elbow pain about 3 months ago without known injury.  She does a lot of  computer work and works at home since 2020.  She had a steroid injection which didn't seem to help, and will be starting shockwave therapy tomorrow.    PERTINENT HISTORY:  HTN, anxiety, migraines, depression  PAIN:  Are you having pain? Yes: NPRS scale: 0 currently/10 Pain location: Rt elbow Pain description: shooting into forearm, occasionally up the arm Aggravating factors: picking up items, pulling and reaching  for items Relieving factors: movement, repositioning  PRECAUTIONS:  None  WEIGHT BEARING RESTRICTIONS:  No  FALLS:  Has patient fallen in last 6 months? 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[employer]  PLOF:  Independent and Leisure: walking 4 miles in the AM (hasn't recently)  PATIENT GOALS:  Improve pain, wants to play volleyball  NEXT MD VISIT: 11/05/22   OBJECTIVE:   DIAGNOSTIC FINDINGS:  MRI: tendinosis with a partial-thickness tear near the extensor tendon origin  PATIENT SURVEYS:  11/05/22: FOTO 51 (predicted 69) 12/03/22: FOTO 69  COGNITIVE STATUS: Within functional limits for tasks assessed    SENSATION: WFL  POSTURE:  rounded shoulders and forward head  HAND DOMINANCE:  Right  GAIT: 11/05/22 Comments: independetn   Elbow/Wrist  PALPATION: 11/05/22: tender with trigger points to Rt wrist extensors, supinator and distal triceps  UPPER EXTREMITY ROM: ROM WNL except wrist extension limited ~ 25%  UPPER EXTREMITY MMT:  MMT Right eval Left eval Right 11/26/22 Right 12/03/22  Elbow flexion 5/5     Elbow extension 4/5     Wrist flexion      Wrist extension 4/5 with pain     Wrist ulnar deviation      Wrist radial deviation      Wrist pronation 4/5     Wrist supination 4/5 with pain     Grip strength 36.7# 54.5# 41.2# 40.2#   (Blank rows = not tested)    TREATMENT:                                                                                                                               DATE:  12/03/22 Objective measures - see above for details  Manual STM with compression to Rt wrist extensors; skilled palpation and monitoring of soft tissue during DN Trigger Point Dry-Needling  Treatment instructions: Expect mild to moderate muscle soreness. S/S of pneumothorax if dry needled over a lung field, and to seek immediate medical attention should they occur. Patient verbalized understanding of these instructions and education.  Patient Consent Given: Yes Education handout provided: Yes Muscles treated: Rt wrist extensors  Electrical stimulation performed: No Parameters: N/A Treatment response/outcome: twitch response, increased soreness reported   11/26/22 Manual STM with compression and percussive device to Rt triceps; skilled palpation and monitoring of soft tissue during DN Trigger Point Dry-Needling  Treatment instructions: Expect mild to moderate muscle soreness. S/S of pneumothorax if dry needled over a lung field, and to seek immediate medical attention should they occur. Patient verbalized understanding of these instructions and education.  Patient Consent Given: Yes Education handout provided: Yes Muscles treated: Rt distal triceps  Electrical stimulation performed: yes Parameters: 8-10 mA freq with intensity to tolerance x 5 min Treatment response/outcome: twitch response, increased soreness reported  TherEx Demonstrated tricep stretch as well as tricep extension with dumbbells - added to HEP  11/19/22 Manual  STM with compression and percussive device to Rt wrist extensors and triceps; skilled palpation and monitoring of soft tissue during DN Trigger Point Dry-Needling  Treatment instructions: Expect mild to moderate muscle soreness. S/S of pneumothorax if dry needled over a lung field, and to seek immediate medical attention should they occur. Patient verbalized understanding of these instructions and  education.  Patient Consent Given: Yes Education handout provided: Yes Muscles treated: Rt wrist extensors, distal triceps  Electrical stimulation performed: No Parameters: N/A Treatment response/outcome: twitch response, increased soreness reported  TherEx Seated wrist extensor stretch 2x20 sec Ulnar nerve stretch 2x10 sec  TherAct Discussed work station set up and specifically mouse.  Encouraged modifications to limit repeated wrist extension - pt verbalized understanding   PATIENT EDUCATION:  Education details: HEP, DN Person educated: Patient Education method: Explanation, Demonstration, and Handouts Education comprehension: verbalized understanding, returned demonstration, and needs further education  HOME EXERCISE PROGRAM: Access Code: [email or web address] URL: [email or web address] Date: 11/19/2022 Prepared by: Moshe Cipro  Exercises - Standing Wrist Flexion Stretch  - 2 x daily - 7 x weekly - 1 sets - 3 reps - 30 sec hold - Seated Eccentric Wrist Extension  - 2 x daily - 7 x weekly - 2-3 sets - 10 reps - Seated Wrist Supination Pronation with Can  - 2 x daily - 7 x weekly - 2-3 sets - 10 reps - Ulnar Nerve Flossing  - 1 x daily - 7 x weekly - 1 sets - 10 reps - 3-5 sec hold - Ulnar Nerve Flossing  - 1 x daily - 7 x weekly - 1 sets - 10 reps - 3-5 sec hold  Patient Education - Trigger Point Dry Needling   ASSESSMENT:  CLINICAL IMPRESSION: Pt tolerated session well today and has met all goals except grip strength, which is improved but not quite to goal.  Overall she is pleased with her progress and will hold PT at this time.   OBJECTIVE IMPAIRMENTS: decreased ROM, decreased strength, increased fascial restrictions, increased muscle spasms, impaired UE functional use, and pain.   ACTIVITY LIMITATIONS: carrying, lifting, toileting, dressing, self feeding, reach over head, and hygiene/grooming  PARTICIPATION LIMITATIONS: meal prep, cleaning, laundry,  shopping, community activity, and occupation  PERSONAL FACTORS: 3+ comorbidities: HTN, anxiety, migraines, depression  are also affecting patient's functional outcome.   REHAB POTENTIAL: Good  CLINICAL DECISION MAKING: Stable/uncomplicated  EVALUATION COMPLEXITY: Low   GOALS: Goals reviewed with patient? Yes  SHORT TERM GOALS: Target date: 11/26/2022  Independent with initial HEP Goal status: MET 11/26/22  LONG TERM GOALS: Target date: 12/17/2022  Independent with final HEP Goal status: MET 12/03/22  2.  FOTO score improved to 69 Goal status: MET 12/03/22  3.  Rt grip strength improved to at least 45# for improved strength and function Goal status: ONGOING 12/03/22  4.  Report pain < 3/10 with work activities for improved function Goal status: MET 12/03/22    PLAN:  PT FREQUENCY: 1-2x/week  PT DURATION: 6 weeks  PLANNED INTERVENTIONS: Therapeutic exercises, Therapeutic activity, Neuromuscular re-education, Patient/Family education, Self Care, Joint mobilization, Aquatic Therapy, Dry Needling, Electrical stimulation, Cryotherapy, Moist heat, scar mobilization, Taping, Ultrasound, Ionotophoresis 4mg /ml Dexamethasone, Manual therapy, and Re-evaluation.  PLAN FOR NEXT SESSION:  hold PT x 30 days; d/c v/s recert   Clarita Crane, PT, DPT 12/03/22 8:28 AM    PHYSICAL THERAPY DISCHARGE SUMMARY  Visits from Start of Care: 4  Current functional level related to goals / functional outcomes: See note  Remaining deficits: See note   Education / Equipment: HEP  Patient goals were partially met. Patient is being discharged due to not returning since the last visit.   Chyrel Masson, PT, DPT, OCS, ATC 01/22/23  3:58 PM

## 2022-12-30 ENCOUNTER — Ambulatory Visit: Payer: No Typology Code available for payment source | Admitting: Nurse Practitioner

## 2023-01-02 ENCOUNTER — Ambulatory Visit: Payer: No Typology Code available for payment source | Admitting: Sports Medicine

## 2023-01-06 ENCOUNTER — Encounter: Payer: Self-pay | Admitting: *Deleted

## 2023-01-06 ENCOUNTER — Encounter: Payer: Self-pay | Admitting: Family Medicine

## 2023-01-06 ENCOUNTER — Ambulatory Visit: Payer: No Typology Code available for payment source | Admitting: Family Medicine

## 2023-01-06 VITALS — BP 120/79 | HR 82 | Temp 97.8°F | Ht 65.0 in | Wt 201.0 lb

## 2023-01-06 DIAGNOSIS — F419 Anxiety disorder, unspecified: Secondary | ICD-10-CM

## 2023-01-06 DIAGNOSIS — K30 Functional dyspepsia: Secondary | ICD-10-CM | POA: Diagnosis not present

## 2023-01-06 DIAGNOSIS — R5383 Other fatigue: Secondary | ICD-10-CM

## 2023-01-06 DIAGNOSIS — I10 Essential (primary) hypertension: Secondary | ICD-10-CM | POA: Diagnosis not present

## 2023-01-06 DIAGNOSIS — E785 Hyperlipidemia, unspecified: Secondary | ICD-10-CM

## 2023-01-06 DIAGNOSIS — Z1211 Encounter for screening for malignant neoplasm of colon: Secondary | ICD-10-CM

## 2023-01-06 MED ORDER — AMLODIPINE BESYLATE 5 MG PO TABS: 5.0000 mg | ORAL_TABLET | Freq: Every day | ORAL | 1 refills | Status: DC

## 2023-01-06 MED ORDER — ALPRAZOLAM 1 MG PO TABS: ORAL_TABLET | ORAL | 2 refills | Status: DC

## 2023-01-06 NOTE — Progress Notes (Signed)
Subjective:    Patient ID: Carrie Gordon, female    DOB: 1971/04/10, 52 y.o.   MRN: [id number]  HPI Patient for blood pressure check up.  The patient does have hypertension.   Patient relates dietary measures try to minimize salt The importance of healthy diet and activity were discussed Patient relates compliance  Patient does have some level of insomnia issues uses Xanax at nighttime to help occasionally uses Xanax during the day when she is stressed she denies being depressed but states she is seeing a counselor currently for some therapy  Patient denies misusing medication  Review of Systems     Objective:   Physical Exam General-in no acute distress Eyes-no discharge Lungs-respiratory rate normal, CTA CV-no murmurs,RRR Extremities skin warm dry no edema Neuro grossly normal Behavior normal, alert        Assessment & Plan:  HTN- patient seen for follow-up regarding HTN.   Diet, medication compliance, appropriate labs and refills were completed.   Importance of keeping blood pressure under good control to lessen the risk of complications discussed Regular follow-up visits discussed  Patient will do lab work somewhere between mid September and mid October  Insomnia Xanax as needed refills given  Anxiety-doing counseling currently.  If progressive symptoms or worse consider antidepressant medications  Patient to follow-up within 6 months

## 2023-01-12 ENCOUNTER — Other Ambulatory Visit: Payer: Self-pay | Admitting: Family Medicine

## 2023-01-14 ENCOUNTER — Ambulatory Visit: Payer: No Typology Code available for payment source | Admitting: Sports Medicine

## 2023-01-14 ENCOUNTER — Encounter: Payer: Self-pay | Admitting: Sports Medicine

## 2023-01-14 DIAGNOSIS — M7711 Lateral epicondylitis, right elbow: Secondary | ICD-10-CM

## 2023-01-14 DIAGNOSIS — S56511A Strain of other extensor muscle, fascia and tendon at forearm level, right arm, initial encounter: Secondary | ICD-10-CM | POA: Diagnosis not present

## 2023-01-14 NOTE — Progress Notes (Signed)
Carrie Gordon - 52 y.o. female MRN [id number]  Date of birth: 1971/03/16  Office Visit Note: Visit Date: 01/14/2023 PCP: Babs Sciara, MD Referred by: Babs Sciara, MD  Subjective: No chief complaint on file.  HPI: Carrie Gordon is a pleasant 52 y.o. female who presents today for follow-up of right elbow pain.   Had US-guided CSI on 08/20/22 - gave decent relief but never took away pain completely. Has done some PT, included dry needling, had some soreness the next day but then this significantly relieved her pain but only for few days and then her pain returns. No medicine currently. We did perform 2 sessions of ECSWT in June - felt like this was temporarily helpful, but her pain returned. This continues to bother her, she even has pain with swinging arms when walking.  Here over the last week or so she will report some tingling in her right hand, this will improve immediately after shaking them out.  This has not been a consistent thing, the majority of her pain is localized to the lateral epicondyle of the elbow.  Pertinent ROS were reviewed with the patient and found to be negative unless otherwise specified above in HPI.   Assessment & Plan: Visit Diagnoses:  1. Lateral epicondylitis, right elbow   2. Partial tear of common extensor tendon of right elbow    Plan: Discussed with Britta Mccreedy options for her recalcitrant right elbow pain with lateral epicondylitis and superimposed partial common extensor tendon tearing.  She has been dealing with this for 6 months or so, still having rather bothersome pain with unable to get back into physical activity.  She has received only partial and temporary benefit from ultrasound-guided corticosteroid injection, physical therapy, dry needling, extracorporeal shockwave therapy.  Discussed at this point her best option for long-term improvement would likely be surgical intervention.  Referral sent to my partner, Dr. Steward Drone.   Did discuss the role for PRP injection therapy. She will meet with Dr. Steward Drone first to discuss surgical options. She will continue her HEP in the meantime.  For only the last week or so she has had some tingling in the fingers, I think this is more so due to her positioning of avoiding certain movements of the elbow that is causing transient paresthesias.  Her Spurling's test was negative today.  Low threshold for any true cervical radiculopathy.  Follow-up: Return for Make appt with Dr. Steward Drone for R-elbow (common ext tear/lat epi).   Meds & Orders: No orders of the defined types were placed in this encounter.   Orders Placed This Encounter  Procedures   Ambulatory referral to Orthopedic Surgery     Procedures: No procedures performed      Clinical History: No specialty comments available.  She reports that she has never smoked. She has never used smokeless tobacco. No results for input(s): "HGBA1C", "LABURIC" in the last 8760 hours.  Objective:   Vital Signs: LMP  (LMP Unknown)   Physical Exam  Gen: Well-appearing, in no acute distress; non-toxic CV: Well-perfused. Warm.  Resp: Breathing unlabored on room air; no wheezing. Psych: Fluid speech in conversation; appropriate affect; normal thought process Neuro: Sensation intact throughout. No gross coordination deficits.   Ortho Exam - Right elbow: + TTP over the lateral epicondyle and just distal to this over the common extensor tendon origin.  There is full range of motion of the elbow, no varus or valgus instability.  There is pain with resisted wrist pronation and  wrist extension.  Positive Maulder's and Cozen's test.  There is no redness swelling or effusion of the elbow.  -Negative Spurling's test bilaterally.  Equivocal Tinel's test at the carpal tunnel.  Imaging:  MR Elbow Right w/o contrast CLINICAL DATA:  Lateral epicondylar pain  EXAM: MRI OF THE RIGHT ELBOW WITHOUT CONTRAST  TECHNIQUE: Multiplanar, multisequence  MR imaging of the elbow was performed. No intravenous contrast was administered.  COMPARISON:  None Available.  FINDINGS: TENDONS  Common forearm flexor origin: Intact  Common forearm extensor origin: Moderate tendinosis of the common extensor tendon origin with a partial-thickness tear.  Biceps: Intact  Triceps: Intact  LIGAMENTS  Medial stabilizers: Intact  Lateral stabilizers:  Intact  Cartilage: No chondral defect.  Joint: No joint effusion. No synovitis.  Cubital tunnel: Normal.  Bones: No fracture or dislocation. No marrow abnormality.  Soft Tissues: Muscles are normal. No fluid collection or hematoma.  IMPRESSION: 1. Moderate tendinosis of the common extensor tendon origin with a partial-thickness tear.  Electronically Signed   By: Elige Ko M.D.   On: 10/28/2022 08:42  Past Medical/Family/Surgical/Social History: Medications & Allergies reviewed per EMR, new medications updated. Patient Active Problem List   Diagnosis Date Noted   Primary hypertension 07/02/2021   Near syncope 12/31/2019   Other fatigue 12/31/2019   Anxiety 08/26/2017   Insomnia 12/19/2016   Migraines 12/06/2014   Past Medical History:  Diagnosis Date   Anxiety    Back problem    Bursitis of shoulder, right    Depression    Elevated hemoglobin A1c 2017   Hypertension    Low vitamin D level    Migraines    Uterine fibroid    Family History  Adopted: Yes  Problem Relation Age of Onset   Breast cancer Neg Hx    Past Surgical History:  Procedure Laterality Date   ABDOMINAL HYSTERECTOMY  2010   TAH--Dr. Edward Jolly   MYOMECTOMY  2006   -Dr. Edward Jolly   TUBAL LIGATION  2002   -Dr. Edward Jolly   Social History   Occupational History   Not on file  Tobacco Use   Smoking status: Never   Smokeless tobacco: Never  Vaping Use   Vaping status: Never Used  Substance and Sexual Activity   Alcohol use: Yes    Alcohol/week: 1.0 standard drink of alcohol    Types: 1 Glasses of wine  per week   Drug use: No   Sexual activity: Yes    Partners: Male    Birth control/protection: Surgical    Comment: TAH

## 2023-01-14 NOTE — Telephone Encounter (Signed)
Please verify with patient, if she utilize as needed this PPI or using omeprazole?  If using omeprazole please refuse this prescription thank you

## 2023-01-28 LAB — BASIC METABOLIC PANEL
BUN/Creatinine Ratio: 9 (ref 9–23)
BUN: 8 mg/dL (ref 6–24)
CO2: 21 mmol/L (ref 20–29)
Calcium: 8.5 mg/dL — ABNORMAL LOW (ref 8.7–10.2)
Chloride: 102 mmol/L (ref 96–106)
Creatinine, Ser: 0.93 mg/dL (ref 0.57–1.00)
Glucose: 160 mg/dL — ABNORMAL HIGH (ref 70–99)
Potassium: 3.4 mmol/L — ABNORMAL LOW (ref 3.5–5.2)
Sodium: 139 mmol/L (ref 134–144)
eGFR: 73 mL/min/{1.73_m2} (ref >59)

## 2023-01-28 LAB — LIPID PANEL
Chol/HDL Ratio: 2.9 ratio (ref 0.0–4.4)
Cholesterol, Total: 162 mg/dL (ref 100–199)
HDL: 55 mg/dL (ref >39)
LDL Chol Calc (NIH): 90 mg/dL (ref 0–99)
Triglycerides: 92 mg/dL (ref 0–149)
VLDL Cholesterol Cal: 17 mg/dL (ref 5–40)

## 2023-01-28 LAB — MICROALBUMIN / CREATININE URINE RATIO
Creatinine, Urine: 160 mg/dL
Microalb/Creat Ratio: 8 mg/g{creat} (ref 0–29)
Microalbumin, Urine: 12.5 ug/mL

## 2023-01-29 ENCOUNTER — Other Ambulatory Visit: Payer: Self-pay

## 2023-01-29 DIAGNOSIS — R739 Hyperglycemia, unspecified: Secondary | ICD-10-CM

## 2023-01-29 DIAGNOSIS — E876 Hypokalemia: Secondary | ICD-10-CM

## 2023-01-29 MED ORDER — POTASSIUM CHLORIDE CRYS ER 10 MEQ PO TBCR: 10.0000 meq | EXTENDED_RELEASE_TABLET | Freq: Every day | ORAL | 0 refills | Status: DC

## 2023-01-29 MED ORDER — ONDANSETRON HCL 8 MG PO TABS: ORAL_TABLET | ORAL | 0 refills | Status: DC

## 2023-01-29 NOTE — Progress Notes (Deleted)
[name] here for annual exam.    PCP:     No LMP recorded (lmp unknown). Patient has had a hysterectomy.           Sexually active: {yes no:314532}  The current method of family planning is status post hysterectomy.    Exercising: {yes no:314532}  {types:19826} Smoker:  no  Health Maintenance: Pap:  2014 neg History of abnormal Pap:  yes MMG:  08/15/22 Breast Density Cat C, BI-RADS CAT 1 neg Colonoscopy:  04/28/17 BMD:   n/a  Result  n/a TDaP:  07/15/13 Gardasil:   no HIV: neg in past Hep C: n/a Screening Labs:  Hb today: ***, Urine today: ***   reports that she has never smoked. She has never used smokeless tobacco. She reports current alcohol use of about 1.0 standard drink of alcohol per week. She reports that she does not use drugs.  Past Medical History:  Diagnosis Date   Anxiety    Back problem    Bursitis of shoulder, right    Depression    Elevated hemoglobin A1c 2017   Hypertension    Low vitamin D level    Migraines    Uterine fibroid     Past Surgical History:  Procedure Laterality Date   ABDOMINAL HYSTERECTOMY  2010   TAH--Dr. Edward Jolly   MYOMECTOMY  2006   -Dr. Edward Jolly   TUBAL LIGATION  2002   -Dr. Edward Jolly    Current Outpatient Medications  Medication Sig Dispense Refill   ALPRAZolam (XANAX) 1 MG tablet TAKE 1/2 TABLET TO 1 TABLET TWICE DAILY AS NEEDED USE SPARINGLY FOR ANXIETY 45 tablet 2   amLODipine (NORVASC) 5 MG tablet Take 1 tablet (5 mg total) by mouth daily. 90 tablet 1   omeprazole (PRILOSEC OTC) 20 MG tablet Take 20 mg by mouth daily.     ondansetron (ZOFRAN) 8 MG tablet TAKE 1 TABLET BY MOUTH EVERY 8 HOURS AS NEEDED FOR NAUSEA 15 tablet 0   potassium chloride (KLOR-CON M) 10 MEQ tablet Take 1 tablet (10 mEq total) by mouth daily for 15 days. 15 tablet 0   Current Facility-Administered Medications  Medication Dose Route Frequency Provider Last Rate Last Admin   0.9 %  sodium chloride infusion  500 mL  Intravenous Once Danis, Andreas Blower, MD        Family History  Adopted: Yes  Problem Relation Age of Onset   Breast cancer Neg Hx     Review of Systems  Exam:   LMP  (LMP Unknown)     General appearance: alert, cooperative and appears stated age Head: normocephalic, without obvious abnormality, atraumatic Neck: no adenopathy, supple, symmetrical, trachea midline and thyroid normal to inspection and palpation Lungs: clear to auscultation bilaterally Breasts: normal appearance, no masses or tenderness, No nipple retraction or dimpling, No nipple discharge or bleeding, No axillary adenopathy Heart: regular rate and rhythm Abdomen: soft, non-tender; no masses, no organomegaly Extremities: extremities normal, atraumatic, no cyanosis or edema Skin: skin color, texture, turgor normal. No rashes or lesions Lymph nodes: cervical, supraclavicular, and axillary nodes normal. Neurologic: grossly normal  Pelvic: External genitalia:  no lesions              No abnormal inguinal nodes palpated.              Urethra:  normal appearing urethra with no masses, tenderness or lesions  Bartholins and Skenes: normal                 Vagina: normal appearing vagina with normal color and discharge, no lesions              Cervix: no lesions              Pap taken: {yes no:314532} Bimanual Exam:  Uterus:  normal size, contour, position, consistency, mobility, non-tender              Adnexa: no mass, fullness, tenderness              Rectal exam: {yes no:314532}.  Confirms.              Anus:  normal sphincter tone, no lesions  Chaperone was present for exam:  ***  Assessment:   Well woman visit with gynecologic exam.   Plan: Mammogram screening discussed. Self breast awareness reviewed. Pap and HR HPV as above. Guidelines for Calcium, Vitamin D, regular exercise program including cardiovascular and weight bearing exercise.   Follow up annually and prn.   Additional counseling  given.  {yes [id number]. _______ minutes face to face time of which over 50% was spent in counseling.    After visit summary provided.

## 2023-01-31 ENCOUNTER — Ambulatory Visit (HOSPITAL_BASED_OUTPATIENT_CLINIC_OR_DEPARTMENT_OTHER): Payer: No Typology Code available for payment source | Admitting: Orthopaedic Surgery

## 2023-01-31 ENCOUNTER — Other Ambulatory Visit (HOSPITAL_BASED_OUTPATIENT_CLINIC_OR_DEPARTMENT_OTHER): Payer: Self-pay | Admitting: Orthopaedic Surgery

## 2023-01-31 ENCOUNTER — Ambulatory Visit (HOSPITAL_BASED_OUTPATIENT_CLINIC_OR_DEPARTMENT_OTHER): Payer: Self-pay | Admitting: Orthopaedic Surgery

## 2023-01-31 ENCOUNTER — Other Ambulatory Visit (HOSPITAL_BASED_OUTPATIENT_CLINIC_OR_DEPARTMENT_OTHER): Payer: Self-pay

## 2023-01-31 DIAGNOSIS — M7711 Lateral epicondylitis, right elbow: Secondary | ICD-10-CM | POA: Diagnosis not present

## 2023-01-31 MED ORDER — ACETAMINOPHEN 500 MG PO TABS
500.0000 mg | ORAL_TABLET | Freq: Three times a day (TID) | ORAL | 0 refills | Status: DC
  Filled 2023-01-31: qty 30, 10d supply, fill #0

## 2023-01-31 MED ORDER — OXYCODONE HCL 5 MG PO TABS
5.0000 mg | ORAL_TABLET | ORAL | 0 refills | Status: DC | PRN
Start: 2023-01-31 — End: 2023-03-03
  Filled 2023-01-31: qty 10, 2d supply, fill #0

## 2023-01-31 MED ORDER — IBUPROFEN 800 MG PO TABS
800.0000 mg | ORAL_TABLET | Freq: Three times a day (TID) | ORAL | 0 refills | Status: AC
  Filled 2023-01-31: qty 30, 10d supply, fill #0

## 2023-01-31 NOTE — Progress Notes (Signed)
Chief Complaint: Right lateral epicondyle debridement     History of Present Illness:    Carrie Gordon is a 52 y.o. female right-hand-dominant presents with right lateral based elbow pain it has been ongoing now for several years.  She states this is bothering her during most activities of daily living including reaching for objects and items.  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  She is here today with her husband.  She has been seen by Dr. Shon Baton was performed lateral epicondylar injection which gave her some temporary relief.  She has been working in physical therapy on stretching and strengthening without any relief.  At today's visit she is having persistent symptoms which are quite limited.    Surgical History:   None  PMH/PSH/Family History/Social History/Meds/Allergies:    Past Medical History:  Diagnosis Date   Anxiety    Back problem    Bursitis of shoulder, right    Depression    Elevated hemoglobin A1c 2017   Hypertension    Low vitamin D level    Migraines    Uterine fibroid    Past Surgical History:  Procedure Laterality Date   ABDOMINAL HYSTERECTOMY  2010   TAH--Dr. Edward Jolly   MYOMECTOMY  2006   -Dr. Edward Jolly   TUBAL LIGATION  2002   -Dr. Edward Jolly   Social History   Socioeconomic History   Marital status: Married    Spouse name: Not on file   Number of children: 1   Years of education: Not on file   Highest education level: Not on file  Occupational History   Not on file  Tobacco Use   Smoking status: Never   Smokeless tobacco: Never  Vaping Use   Vaping status: Never Used  Substance and Sexual Activity   Alcohol use: Yes    Alcohol/week: 1.0 standard drink of alcohol    Types: 1 Glasses of wine per week   Drug use: No   Sexual activity: Yes    Partners: Male    Birth control/protection: Surgical    Comment: TAH  Other Topics Concern   Not on file  Social History Narrative    Not on file   Social Determinants of Health   Financial Resource Strain: Not on file  Food Insecurity: Not on file  Transportation Needs: Not on file  Physical Activity: Not on file  Stress: Not on file  Social Connections: Not on file   Family History  Adopted: Yes  Problem Relation Age of Onset   Breast cancer Neg Hx    No Known Allergies Current Outpatient Medications  Medication Sig Dispense Refill   acetaminophen (TYLENOL) 500 MG tablet Take 1 tablet (500 mg total) by mouth every 8 (eight) hours for 10 days. 30 tablet 0   ibuprofen (ADVIL) 800 MG tablet Take 1 tablet (800 mg total) by mouth every 8 (eight) hours for 10 days. Please take with food, please alternate with acetaminophen 30 tablet 0   oxyCODONE (ROXICODONE) 5 MG immediate release tablet Take 1 tablet (5 mg total) by mouth every 4 (four) hours as needed for severe pain or breakthrough pain. 10 tablet 0   ALPRAZolam (XANAX) 1 MG tablet TAKE 1/2 TABLET TO 1 TABLET TWICE DAILY AS NEEDED USE SPARINGLY FOR ANXIETY 45 tablet 2  amLODipine (NORVASC) 5 MG tablet Take 1 tablet (5 mg total) by mouth daily. 90 tablet 1   omeprazole (PRILOSEC OTC) 20 MG tablet Take 20 mg by mouth daily.     ondansetron (ZOFRAN) 8 MG tablet TAKE 1 TABLET BY MOUTH EVERY 8 HOURS AS NEEDED FOR NAUSEA 15 tablet 0   potassium chloride (KLOR-CON M) 10 MEQ tablet Take 1 tablet (10 mEq total) by mouth daily for 15 days. 15 tablet 0   Current Facility-Administered Medications  Medication Dose Route Frequency Provider Last Rate Last Admin   0.9 %  sodium chloride infusion  500 mL Intravenous Once Charlie Pitter III, MD       No results found.  Review of Systems:   A ROS was performed including pertinent positives and negatives as documented in the HPI.  Physical Exam :   Constitutional: NAD and appears stated age Neurological: Alert and oriented Psych: Appropriate affect and cooperative There were no vitals taken for this visit.    Comprehensive Musculoskeletal Exam:    Inspection Right Left  Skin No atrophy or gross abnormalities appreciated No atrophy or gross abnormalities appreciated  Palpation    Tenderness None None  Crepitus None None  Range of Motion    Flexion (passive) 160 160  Flexion (active) 160 160  Extension 0 0  Pronation normal normal  Supination normal normal   Strength    Flexion  5/5 5/5  Extension 5/5 5/5  Pronation  5/5 5/5  Supination  5/5 5/5  Special Tests    Lateral epicondyle pain with resisted wrist extension Positive Negative  Medial epicondyle pain with resisted wrist flexion  Negative Negative  Tinel test over cubital tunnel  Negative  Negative   Instability    Generalized Laxity No No  Varus stress No instability No instability  Valgus stress  No instability No instability  Reflexes    Triceps Normal (2/4) Normal (2/4)  Brachioradialis Normal (2/4) Normal (2/4)  Biceps Normal (2/4) Normal (2/4)  Neurologic    Fires PIN, radial, median, ulnar, musculocutaneous, axillary, suprascapular, long thoracic, and spinal accessory innervated muscles. No abnormal sensibility.  Vascular/Lymphatic    Radial Pulse 2+ 2+  Cervical Exam    Patient has symmetric cervical range of motion with Negative Spurling's test.     Imaging:   Xray (3 views right elbow): Normal  MRI (right elbow): Tear of the common extensor origin of the lateral epicondyle  I personally reviewed and interpreted the radiographs.   Assessment:   52 y.o. female right dominant with a lateral epicondyle or common extensor origin tear.  Today's visit I did discuss that she has trialed physical therapy as well as an injection.  None of this has given her relief.  To that effect we did discuss the role for debridement repair.  I did discuss the risks and limitations associated with this.  We did discuss the specific timing of the rehab.  After discussion she has elected for right lateral epicondylar debridement  and repair  Plan :    -Plan for right lateral epicondylar debridement and repair   After a lengthy discussion of treatment options, including risks, benefits, alternatives, complications of surgical and nonsurgical conservative options, the patient elected surgical repair.   The patient  is aware of the material risks  and complications including, but not limited to injury to adjacent structures, neurovascular injury, infection, numbness, bleeding, implant failure, thermal burns, stiffness, persistent pain, failure to heal, disease transmission from allograft, need  for further surgery, dislocation, anesthetic risks, blood clots, risks of death,and others. The probabilities of surgical success and failure discussed with patient given their particular co-morbidities.The time and nature of expected rehabilitation and recovery was discussed.The patient's questions were all answered preoperatively.  No barriers to understanding were noted. I explained the natural history of the disease process and Rx rationale.  I explained to the patient what I considered to be reasonable expectations given their personal situation.  The final treatment plan was arrived at through a shared patient decision making process model.      I personally saw and evaluated the patient, and participated in the management and treatment plan.  Huel Cote, MD Attending Physician, Orthopedic Surgery  This document was dictated using Dragon voice recognition software. A reasonable attempt at proof reading has been made to minimize errors.

## 2023-02-05 ENCOUNTER — Other Ambulatory Visit: Payer: Self-pay | Admitting: Family Medicine

## 2023-02-12 ENCOUNTER — Ambulatory Visit: Payer: No Typology Code available for payment source | Admitting: Obstetrics and Gynecology

## 2023-02-17 ENCOUNTER — Other Ambulatory Visit: Payer: Self-pay

## 2023-02-17 ENCOUNTER — Encounter (HOSPITAL_BASED_OUTPATIENT_CLINIC_OR_DEPARTMENT_OTHER): Payer: Self-pay | Admitting: Orthopaedic Surgery

## 2023-02-18 ENCOUNTER — Other Ambulatory Visit (HOSPITAL_BASED_OUTPATIENT_CLINIC_OR_DEPARTMENT_OTHER): Payer: No Typology Code available for payment source

## 2023-02-18 ENCOUNTER — Encounter: Payer: Self-pay | Admitting: Sports Medicine

## 2023-02-18 ENCOUNTER — Encounter (HOSPITAL_BASED_OUTPATIENT_CLINIC_OR_DEPARTMENT_OTHER)
Admission: RE | Admit: 2023-02-18 | Discharge: 2023-02-18 | Disposition: A | Discharge status: Home / Self Care | Payer: No Typology Code available for payment source | Source: Ambulatory Visit | Attending: Orthopaedic Surgery | Admitting: Orthopaedic Surgery

## 2023-02-18 ENCOUNTER — Ambulatory Visit: Payer: No Typology Code available for payment source | Admitting: Sports Medicine

## 2023-02-18 DIAGNOSIS — Z01812 Encounter for preprocedural laboratory examination: Secondary | ICD-10-CM | POA: Insufficient documentation

## 2023-02-18 DIAGNOSIS — G5601 Carpal tunnel syndrome, right upper limb: Secondary | ICD-10-CM

## 2023-02-18 DIAGNOSIS — S56511A Strain of other extensor muscle, fascia and tendon at forearm level, right arm, initial encounter: Secondary | ICD-10-CM

## 2023-02-18 NOTE — Progress Notes (Signed)

## 2023-02-18 NOTE — Progress Notes (Signed)
Carrie Gordon - 52 y.o. female MRN [id number]  Date of birth: 09-17-1970  Office Visit Note: Visit Date: 02/18/2023 PCP: Babs Sciara, MD Referred by: Babs Sciara, MD  Subjective: Chief Complaint  Patient presents with   Right Hand - Numbness   HPI: Carrie Gordon is a pleasant 52 y.o. female who presents today for right hand/finger paresthesias.  Saw me back on 01/14/23 and at that time had been happening intermittently for a week or so.  This has started to become more frequent, the other day it awoke her from sleep because of the tingling and discomfort.  She does have pain in the elbow from her common extensor tear that will radiate up and down but the numbness and tingling is more so localized to the wrist and hand/fingers.  She is not taking any medication for this.  She does have her right elbow lateral epicondyle debridement and repair on 02/24/2023.  Pertinent ROS were reviewed with the patient and found to be negative unless otherwise specified above in HPI.   Assessment & Plan: Visit Diagnoses:  1. Carpal tunnel syndrome, right upper limb   2. Partial tear of common extensor tendon of right elbow    Plan: Leanette is dealing with carpal tunnel of the right wrist.  This is likely secondary to her adjusting and using the right upper extremity differently given her right elbow epicondylitis with partial tearing.  She also does a lot of desk and repetitive work for her job at [employer].  We did fit her for a cock up wrist brace that she will wear at nighttime.  Recommended ice for 15-20 minutes at nighttime as well as some carpal tunnel stretches.  Did discuss activity modification and proper ergonomics with work.  She will follow-up with me about 6 weeks after her elbow surgery for reevaluation of her CTS. May use Tylenol, Ibuprofen/Aleve as needed.  Follow-up: Return for f/u with me 6 weeks after her surgery for R-hand/CTS.   Meds & Orders: No  orders of the defined types were placed in this encounter.  No orders of the defined types were placed in this encounter.    Procedures: No procedures performed      Clinical History: No specialty comments available.  She reports that she has never smoked. She has never used smokeless tobacco. No results for input(s): "HGBA1C", "LABURIC" in the last 8760 hours.  Objective:   Vital Signs: LMP  (LMP Unknown)   Physical Exam  Gen: Well-appearing, in no acute distress; non-toxic CV: Well-perfused. Warm.  Resp: Breathing unlabored on room air; no wheezing. Psych: Fluid speech in conversation; appropriate affect; normal thought process Neuro: Sensation intact throughout. No gross coordination deficits.   Ortho Exam - Cervical: FROM, Negative Spurling's  -Right wrist/hand: Positive Tinel's over the volar aspect of the proximal carpal tunnel, full range of motion.  No redness or swelling.  Positive Phalen's, reverse Phalen's, positive Durkan's test. NVI.  Imaging: No results found.  Past Medical/Family/Surgical/Social History: Medications & Allergies reviewed per EMR, new medications updated. Patient Active Problem List   Diagnosis Date Noted   Primary hypertension 07/02/2021   Near syncope 12/31/2019   Other fatigue 12/31/2019   Anxiety 08/26/2017   Insomnia 12/19/2016   Migraines 12/06/2014   Past Medical History:  Diagnosis Date   Anxiety    Back problem    Bursitis of shoulder, right    Depression    Elevated hemoglobin A1c 2017   Hypertension  Low vitamin D level    Migraines    Uterine fibroid    Family History  Adopted: Yes  Problem Relation Age of Onset   Breast cancer Neg Hx    Past Surgical History:  Procedure Laterality Date   ABDOMINAL HYSTERECTOMY  2010   TAH--Dr. Edward Jolly   MYOMECTOMY  2006   -Dr. Edward Jolly   TUBAL LIGATION  2002   -Dr. Edward Jolly   Social History   Occupational History   Not on file  Tobacco Use   Smoking status: Never   Smokeless  tobacco: Never  Vaping Use   Vaping status: Never Used  Substance and Sexual Activity   Alcohol use: Yes    Alcohol/week: 1.0 standard drink of alcohol    Types: 1 Glasses of wine per week   Drug use: No   Sexual activity: Yes    Partners: Male    Birth control/protection: Surgical    Comment: TAH

## 2023-02-18 NOTE — Progress Notes (Signed)
Patient has been having numbness in her hand that has gotten more intense and constant when she sleeps. She would like to confirm that everything is okay prior to surgery on the 14th.

## 2023-02-21 ENCOUNTER — Encounter
Admission: RE | Admit: 2023-02-21 | Discharge: 2023-02-21 | Disposition: A | Discharge status: Home / Self Care | Payer: No Typology Code available for payment source | Source: Ambulatory Visit | Attending: Orthopaedic Surgery | Admitting: Orthopaedic Surgery

## 2023-02-21 NOTE — Pre-Procedure Instructions (Addendum)
Called pt to inform her of new phone # to call today for arrival time and new location of upcoming surgery on 10-14 with Dr Steward Drone. Pt was originally scheduled for [facility] and due to staffing issues pt was moved to [facility]. PAT RN at [facility] had already completed anesthesia interview. Completed pts chart and added consent to chart. Pt verbalized she had instructions from PAT [facility] at [facility] and repeated back to me instructions given from previous RN

## 2023-02-23 MED ORDER — CEFAZOLIN SODIUM-DEXTROSE 2-4 GM/100ML-% IV SOLN
2.0000 g | INTRAVENOUS | Status: AC
  Administered 2023-02-24: 2 g via INTRAVENOUS

## 2023-02-23 MED ORDER — GABAPENTIN 300 MG PO CAPS
300.0000 mg | ORAL_CAPSULE | Freq: Once | ORAL | Status: AC
  Administered 2023-02-24: 300 mg via ORAL

## 2023-02-23 MED ORDER — CHLORHEXIDINE GLUCONATE 0.12 % MT SOLN
15.0000 mL | Freq: Once | OROMUCOSAL | Status: AC
  Administered 2023-02-24: 15 mL via OROMUCOSAL

## 2023-02-23 MED ORDER — TRANEXAMIC ACID-NACL 1000-0.7 MG/100ML-% IV SOLN
1000.0000 mg | INTRAVENOUS | Status: AC
  Administered 2023-02-24: 1000 mg via INTRAVENOUS

## 2023-02-23 MED ORDER — ACETAMINOPHEN 500 MG PO TABS
1000.0000 mg | ORAL_TABLET | Freq: Once | ORAL | Status: AC
  Administered 2023-02-24: 1000 mg via ORAL

## 2023-02-23 MED ORDER — ORAL CARE MOUTH RINSE: 15.0000 mL | Freq: Once | OROMUCOSAL | Status: AC

## 2023-02-23 MED ORDER — LACTATED RINGERS IV SOLN: INTRAVENOUS | Status: DC

## 2023-02-24 ENCOUNTER — Encounter: Payer: Self-pay | Admitting: Orthopaedic Surgery

## 2023-02-24 ENCOUNTER — Encounter: Payer: Self-pay | Admitting: Anesthesiology

## 2023-02-24 ENCOUNTER — Encounter
Admission: RE | Disposition: A | Discharge status: Home / Self Care | Payer: Self-pay | Source: Home / Self Care | Attending: Orthopaedic Surgery

## 2023-02-24 ENCOUNTER — Encounter: Payer: Self-pay | Admitting: Urgent Care

## 2023-02-24 ENCOUNTER — Other Ambulatory Visit: Payer: Self-pay

## 2023-02-24 ENCOUNTER — Ambulatory Visit (HOSPITAL_BASED_OUTPATIENT_CLINIC_OR_DEPARTMENT_OTHER)
Admission: RE | Admit: 2023-02-24 | Discharge: 2023-02-24 | Disposition: A | Discharge status: Home / Self Care | Payer: No Typology Code available for payment source | Attending: Orthopaedic Surgery | Admitting: Orthopaedic Surgery

## 2023-02-24 DIAGNOSIS — F419 Anxiety disorder, unspecified: Secondary | ICD-10-CM | POA: Insufficient documentation

## 2023-02-24 DIAGNOSIS — I10 Essential (primary) hypertension: Secondary | ICD-10-CM | POA: Insufficient documentation

## 2023-02-24 DIAGNOSIS — M7711 Lateral epicondylitis, right elbow: Secondary | ICD-10-CM | POA: Diagnosis present

## 2023-02-24 DIAGNOSIS — F32A Depression, unspecified: Secondary | ICD-10-CM | POA: Insufficient documentation

## 2023-02-24 DIAGNOSIS — K219 Gastro-esophageal reflux disease without esophagitis: Secondary | ICD-10-CM | POA: Insufficient documentation

## 2023-02-24 DIAGNOSIS — Z01818 Encounter for other preprocedural examination: Secondary | ICD-10-CM

## 2023-02-24 HISTORY — PX: TENNIS ELBOW RELEASE/NIRSCHEL PROCEDURE: SHX6651

## 2023-02-24 SURGERY — TENNIS ELBOW RELEASE/NIRSCHEL PROCEDURE
Anesthesia: Regional | Site: Elbow | Laterality: Right

## 2023-02-24 MED ORDER — DEXAMETHASONE SODIUM PHOSPHATE 10 MG/ML IJ SOLN
INTRAMUSCULAR | Status: DC | PRN
  Administered 2023-02-24: 10 mg via INTRAVENOUS

## 2023-02-24 MED ORDER — LIDOCAINE HCL (CARDIAC) PF 100 MG/5ML IV SOSY
PREFILLED_SYRINGE | INTRAVENOUS | Status: DC | PRN
  Administered 2023-02-24: 50 mg via INTRAVENOUS

## 2023-02-24 MED ORDER — ONDANSETRON HCL 4 MG/2ML IJ SOLN
INTRAMUSCULAR | Status: DC | PRN
  Administered 2023-02-24: 4 mg via INTRAVENOUS

## 2023-02-24 MED ORDER — BUPIVACAINE HCL (PF) 0.25 % IJ SOLN
INTRAMUSCULAR | Status: AC
  Filled 2023-02-24: qty 30

## 2023-02-24 MED ORDER — FENTANYL CITRATE (PF) 100 MCG/2ML IJ SOLN
INTRAMUSCULAR | Status: AC
  Filled 2023-02-24: qty 2

## 2023-02-24 MED ORDER — CEFAZOLIN SODIUM-DEXTROSE 2-4 GM/100ML-% IV SOLN
INTRAVENOUS | Status: AC
  Filled 2023-02-24: qty 100

## 2023-02-24 MED ORDER — OXYCODONE HCL 5 MG PO TABS
ORAL_TABLET | ORAL | Status: AC
  Filled 2023-02-24: qty 1

## 2023-02-24 MED ORDER — LIDOCAINE HCL (PF) 2 % IJ SOLN
INTRAMUSCULAR | Status: AC
  Filled 2023-02-24: qty 5

## 2023-02-24 MED ORDER — ACETAMINOPHEN 500 MG PO TABS
ORAL_TABLET | ORAL | Status: AC
  Filled 2023-02-24: qty 2

## 2023-02-24 MED ORDER — DEXAMETHASONE SODIUM PHOSPHATE 10 MG/ML IJ SOLN
INTRAMUSCULAR | Status: AC
  Filled 2023-02-24: qty 1

## 2023-02-24 MED ORDER — DROPERIDOL 2.5 MG/ML IJ SOLN: 0.6250 mg | Freq: Once | INTRAMUSCULAR | Status: DC | PRN

## 2023-02-24 MED ORDER — PROPOFOL 10 MG/ML IV BOLUS
INTRAVENOUS | Status: AC
  Filled 2023-02-24: qty 20

## 2023-02-24 MED ORDER — GABAPENTIN 300 MG PO CAPS
ORAL_CAPSULE | ORAL | Status: AC
  Filled 2023-02-24: qty 1

## 2023-02-24 MED ORDER — TRANEXAMIC ACID-NACL 1000-0.7 MG/100ML-% IV SOLN
INTRAVENOUS | Status: AC
  Filled 2023-02-24: qty 100

## 2023-02-24 MED ORDER — FENTANYL CITRATE (PF) 100 MCG/2ML IJ SOLN
INTRAMUSCULAR | Status: DC | PRN
  Administered 2023-02-24 (×2): 50 ug via INTRAVENOUS

## 2023-02-24 MED ORDER — ONDANSETRON HCL 4 MG/2ML IJ SOLN
INTRAMUSCULAR | Status: AC
  Filled 2023-02-24: qty 2

## 2023-02-24 MED ORDER — OXYCODONE HCL 5 MG PO TABS
5.0000 mg | ORAL_TABLET | Freq: Once | ORAL | Status: AC
  Administered 2023-02-24: 5 mg via ORAL

## 2023-02-24 MED ORDER — FENTANYL CITRATE (PF) 100 MCG/2ML IJ SOLN
25.0000 ug | INTRAMUSCULAR | Status: DC | PRN
  Administered 2023-02-24 (×2): 25 ug via INTRAVENOUS
  Administered 2023-02-24: 50 ug via INTRAVENOUS

## 2023-02-24 MED ORDER — BUPIVACAINE HCL (PF) 0.25 % IJ SOLN
INTRAMUSCULAR | Status: DC | PRN
  Administered 2023-02-24: 10 mL

## 2023-02-24 MED ORDER — MIDAZOLAM HCL 2 MG/2ML IJ SOLN
INTRAMUSCULAR | Status: AC
  Filled 2023-02-24: qty 2

## 2023-02-24 MED ORDER — MIDAZOLAM HCL 2 MG/2ML IJ SOLN
INTRAMUSCULAR | Status: DC | PRN
  Administered 2023-02-24: 1 mg via INTRAVENOUS

## 2023-02-24 MED ORDER — PROPOFOL 10 MG/ML IV BOLUS
INTRAVENOUS | Status: DC | PRN
  Administered 2023-02-24: 200 mg via INTRAVENOUS

## 2023-02-24 MED ORDER — CHLORHEXIDINE GLUCONATE 0.12 % MT SOLN
OROMUCOSAL | Status: AC
  Filled 2023-02-24: qty 15

## 2023-02-24 SURGICAL SUPPLY — 61 items
APL PRP STRL LF DISP 70% ISPRP (MISCELLANEOUS) ×1
BLADE SURG 15 STRL LF DISP TIS (BLADE) ×2 IMPLANT
BLADE SURG 15 STRL SS (BLADE) ×2
BNDG CMPR 5X3 KNIT ELC UNQ LF (GAUZE/BANDAGES/DRESSINGS) ×1
BNDG CMPR 5X4 CHSV STRCH STRL (GAUZE/BANDAGES/DRESSINGS) ×1
BNDG CMPR 5X4 KNIT ELC UNQ LF (GAUZE/BANDAGES/DRESSINGS) ×1
BNDG COHESIVE 4X5 TAN STRL LF (GAUZE/BANDAGES/DRESSINGS) ×1 IMPLANT
BNDG ELASTIC 3INX 5YD STR LF (GAUZE/BANDAGES/DRESSINGS) ×1 IMPLANT
BNDG ELASTIC 4INX 5YD STR LF (GAUZE/BANDAGES/DRESSINGS) ×1 IMPLANT
BNDG GAUZE DERMACEA FLUFF 4 (GAUZE/BANDAGES/DRESSINGS) ×1 IMPLANT
BNDG GZE DERMACEA 4 6PLY (GAUZE/BANDAGES/DRESSINGS) ×1
CHLORAPREP W/TINT 26 (MISCELLANEOUS) ×1 IMPLANT
COVER BACK TABLE 60X90IN (DRAPES) ×1 IMPLANT
COVER MAYO STAND STRL (DRAPES) ×1 IMPLANT
DRAPE EXTREMITY T 121X128X90 (DISPOSABLE) ×1 IMPLANT
DRAPE IMP U-DRAPE 54X76 (DRAPES) ×1 IMPLANT
DRAPE OEC MINIVIEW 54X84 (DRAPES) IMPLANT
DRAPE U-SHAPE 47X51 STRL (DRAPES) ×1 IMPLANT
ELECT REM PT RETURN 9FT ADLT (ELECTROSURGICAL) ×1
ELECTRODE REM PT RTRN 9FT ADLT (ELECTROSURGICAL) ×1 IMPLANT
GAUZE SPONGE 4X4 12PLY STRL (GAUZE/BANDAGES/DRESSINGS) ×1 IMPLANT
GAUZE XEROFORM 1X8 LF (GAUZE/BANDAGES/DRESSINGS) ×1 IMPLANT
GLOVE BIO SURGEON STRL SZ 6 (GLOVE) ×1 IMPLANT
GLOVE BIO SURGEON STRL SZ7 (GLOVE) ×1 IMPLANT
GLOVE BIO SURGEON STRL SZ7.5 (GLOVE) ×1 IMPLANT
GLOVE BIOGEL PI IND STRL 6.5 (GLOVE) ×1 IMPLANT
GLOVE BIOGEL PI IND STRL 8 (GLOVE) ×1 IMPLANT
GOWN STRL REUS W/ TWL LRG LVL3 (GOWN DISPOSABLE) ×2 IMPLANT
GOWN STRL REUS W/TWL LRG LVL3 (GOWN DISPOSABLE) ×2
GOWN STRL REUS W/TWL XL LVL3 (GOWN DISPOSABLE) ×1 IMPLANT
KIT FIBERTAK DX KNTLS INSTBLT (Anchor) IMPLANT
NDL HYPO 25X1 1.5 SAFETY (NEEDLE) IMPLANT
NEEDLE HYPO 25X1 1.5 SAFETY (NEEDLE)
NS IRRIG 1000ML POUR BTL (IV SOLUTION) ×1 IMPLANT
PACK BASIN MINOR ARMC (MISCELLANEOUS) ×1 IMPLANT
PAD CAST 3X4 CTTN HI CHSV (CAST SUPPLIES) ×1 IMPLANT
PAD CAST 4YDX4 CTTN HI CHSV (CAST SUPPLIES) ×1 IMPLANT
PADDING CAST COTTON 3X4 STRL (CAST SUPPLIES) ×1
PADDING CAST COTTON 4X4 STRL (CAST SUPPLIES) ×1
PENCIL SMOKE EVACUATOR (MISCELLANEOUS) ×1 IMPLANT
SLEEVE SCD COMPRESS KNEE MED (STOCKING) ×1 IMPLANT
SLING ARM M TX990204 (SOFTGOODS) IMPLANT
STOCKINETTE IMPERVIOUS 9X36 MD (GAUZE/BANDAGES/DRESSINGS) IMPLANT
STRIP SUTURE WOUND CLOSURE 1/2 (MISCELLANEOUS) IMPLANT
SUCTION TUBE FRAZIER 10FR DISP (SUCTIONS) ×1 IMPLANT
SUT ETHILON 3 0 PS 1 (SUTURE) ×1 IMPLANT
SUT FIBERWIRE #2 38 REV NDL BL (SUTURE)
SUT MNCRL AB 3-0 PS2 18 (SUTURE) IMPLANT
SUT MNCRL AB 4-0 PS2 18 (SUTURE) IMPLANT
SUT VIC AB 0 CT1 27 (SUTURE) ×1
SUT VIC AB 0 CT1 27XBRD ANBCTR (SUTURE) ×1 IMPLANT
SUT VIC AB 2-0 CT1 27 (SUTURE) ×1
SUT VIC AB 2-0 CT1 TAPERPNT 27 (SUTURE) ×1 IMPLANT
SUT VIC AB 2-0 SH 27 (SUTURE)
SUT VIC AB 2-0 SH 27XBRD (SUTURE) IMPLANT
SUTURE FIBERWR#2 38 REV NDL BL (SUTURE) IMPLANT
SYR BULB EAR ULCER 3OZ GRN STR (SYRINGE) ×1 IMPLANT
SYR CONTROL 10ML LL (SYRINGE) IMPLANT
TOWEL GREEN STERILE FF (TOWEL DISPOSABLE) ×2 IMPLANT
TUBING CONNECTING 10 (TUBING) ×1 IMPLANT
UNDERPAD 30X36 HEAVY ABSORB (UNDERPADS AND DIAPERS) ×1 IMPLANT

## 2023-02-24 NOTE — Interval H&P Note (Signed)
History and Physical Interval Note:  02/24/2023 2:30 PM  Carrie Gordon  has presented today for surgery, with the diagnosis of RIGHT LATERAL EPICONDYLITIS.  The various methods of treatment have been discussed with the patient and family. After consideration of risks, benefits and other options for treatment, the patient has consented to  Procedure(s): Right elbow lateral epicondyle debridement and repair (Right) as a surgical intervention.  The patient's history has been reviewed, patient examined, no change in status, stable for surgery.  I have reviewed the patient's chart and labs.  Questions were answered to the patient's satisfaction.     Huel Cote

## 2023-02-24 NOTE — Transfer of Care (Signed)
Immediate Anesthesia Transfer of Care Note  Patient: Carrie Gordon  Procedure(s) Performed: Right elbow lateral epicondyle debridement and repair (Right: Elbow)  Patient Location: PACU  Anesthesia Type:General  Level of Consciousness: drowsy and responds to stimulation  Airway & Oxygen Therapy: Patient Spontanous Breathing and Patient connected to face mask oxygen  Post-op Assessment: Report given to RN and Post -op Vital signs reviewed and stable  Post vital signs: Reviewed and stable  Last Vitals:  Vitals Value Taken Time  BP 114/83 02/24/23 1556  Temp 36.6 C 02/24/23 1556  Pulse 73 02/24/23 1558  Resp 17 02/24/23 1558  SpO2 100 % 02/24/23 1558  Vitals shown include unfiled device data.  Last Pain:  Vitals:   02/24/23 1432  TempSrc: Tympanic  PainSc: 8          Complications: No notable events documented.

## 2023-02-24 NOTE — Op Note (Signed)
   Date of Surgery: 02/24/2023  INDICATIONS: Carrie Gordon is a 52 y.o.-year-old female with right elbow lateral epicondylitis.  The risk and benefits of the procedure were discussed in detail and documented in the pre-operative evaluation.   PREOPERATIVE DIAGNOSIS: 1. Right lateral epicondylitis  POSTOPERATIVE DIAGNOSIS: Same.  PROCEDURE: 1. Right lateral epicondyle debridement and repair  SURGEON: Benancio Deeds MD  ASSISTANT: Kerby Less, ATC  ANESTHESIA:  general plus 0.25% marcaine  IV FLUIDS AND URINE: See anesthesia record.  ANTIBIOTICS: Ancef  ESTIMATED BLOOD LOSS: 10 mL.  IMPLANTS:  Implant Name Type Inv. Item Serial No. Manufacturer Lot No. LRB No. Used Action  KIT FIBERTAK DX KNTLS INSTBLT - [id number] Anchor KIT FIBERTAK DX KNTLS INSTBLT  [employer] [id number] Right 1 Implanted    DRAINS: None  CULTURES: None  COMPLICATIONS: none  DESCRIPTION OF PROCEDURE:  The patient was identified in the preoperative holding area.  The correct site was marked according universal protocol with nursing.  Antibiotics were given 1 hour prior to skin incision.    The patient was subsequently taken back to the operating room. They were moved to the operating room table.  All bony prominences were padded. The patient was prepped and draped in usual sterile fashion.  Again final timeout was performed confirming the correct surgical site. Quarter percent marcaine was injected subcutaneously into the wound.  An incision was made curvilinear about the lateral epicondyle.  At this time layer by layer dissection was used with Metzenbaum scissors.  Electrocautery was used to achieve hemostasis.  At this time the lateral origin was identified and released from bone using 15 blade.  Ronguer was used to debride the epicondyle angiofibrodysplastic tissue.  At this time a knotless distal extremity fiber tack was placed into the bony epicondyle.  The suture was passed through the tendon origin  in a horizontal mattres fashion and this was fed into the knotless mechanism.  This was tightened with good apposition to the native origin footprint.  At this time the wound was thoroughly irrigated and closed in layers of 2-0 Vicryl and 3-0 nylon.  Dressings were placed with Xeroform gauze cast padding Webril and an Ace wrap.  A sling was provided.     POSTOPERATIVE PLAN: She will be range of motion as tolerated. She will not lift more than 5 pounds. I will see her back at 2 weeks for suture removal.  Benancio Deeds, MD 3:41 PM

## 2023-02-24 NOTE — Brief Op Note (Signed)
   Brief Op Note  Date of Surgery: 02/24/2023  Preoperative Diagnosis: RIGHT LATERAL EPICONDYLITIS  Postoperative Diagnosis: same  Procedure: Procedure(s): Right elbow lateral epicondyle debridement and repair  Implants: Implant Name Type Inv. Item Serial No. Manufacturer Lot No. LRB No. Used Action  KIT FIBERTAK DX KNTLS INSTBLT - [id number] Anchor KIT FIBERTAK DX [employer] [id number] Right 1 Implanted    Surgeons: Surgeon(s): Huel Cote, MD  Anesthesia: General    Estimated Blood Loss: See anesthesia record  Complications: None  Condition to PACU: Stable  Benancio Deeds, MD 02/24/2023 3:40 PM

## 2023-02-24 NOTE — Anesthesia Procedure Notes (Signed)
Procedure Name: LMA Insertion Date/Time: 02/24/2023 3:15 PM  Performed by: Jeannene Patella, CRNAPatient Re-evaluated:Patient Re-evaluated prior to induction Oxygen Delivery Method: Circle system utilized Preoxygenation: Pre-oxygenation with 100% oxygen Induction Type: IV induction LMA: LMA inserted LMA Size: 4.0 Number of attempts: 1 Placement Confirmation: positive ETCO2 and breath sounds checked- equal and bilateral Tube secured with: Tape Dental Injury: Teeth and Oropharynx as per pre-operative assessment

## 2023-02-24 NOTE — H&P (Signed)
Chief Complaint: Right lateral epicondyle debridement        History of Present Illness:      Carrie Gordon is a 52 y.o. female right-hand-dominant presents with right lateral based elbow pain it has been ongoing now for several years.  She states this is bothering her during most activities of daily living including reaching for objects and items.  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  She is here today with her husband.  She has been seen by Dr. Shon Baton was performed lateral epicondylar injection which gave her some temporary relief.  She has been working in physical therapy on stretching and strengthening without any relief.  At today's visit she is having persistent symptoms which are quite limited.       Surgical History:   None   PMH/PSH/Family History/Social History/Meds/Allergies:         Past Medical History:  Diagnosis Date   Anxiety     Back problem     Bursitis of shoulder, right     Depression     Elevated hemoglobin A1c 2017   Hypertension     Low vitamin D level     Migraines     Uterine fibroid               Past Surgical History:  Procedure Laterality Date   ABDOMINAL HYSTERECTOMY   2010    [facility]--Dr. Edward Jolly   MYOMECTOMY   2006    -Dr. Edward Jolly   TUBAL LIGATION   2002    -Dr. Edward Jolly        Social History         Socioeconomic History   Marital status: Married      Spouse name: Not on file   Number of children: 1   Years of education: Not on file   Highest education level: Not on file  Occupational History   Not on file  Tobacco Use   Smoking status: Never   Smokeless tobacco: Never  Vaping Use   Vaping status: Never Used  Substance and Sexual Activity   Alcohol use: Yes      Alcohol/week: 1.0 standard drink of alcohol      Types: 1 Glasses of wine per week   Drug use: No   Sexual activity: Yes      Partners: Male      Birth control/protection: Surgical      Comment: TAH  Other Topics  Concern   Not on file  Social History Narrative   Not on file    Social Determinants of Health    Financial Resource Strain: Not on file  Food Insecurity: Not on file  Transportation Needs: Not on file  Physical Activity: Not on file  Stress: Not on file  Social Connections: Not on file         Family History  Adopted: Yes  Problem Relation Age of Onset   Breast cancer Neg Hx          Allergies  No Known Allergies         Current Outpatient Medications  Medication Sig Dispense Refill   acetaminophen (TYLENOL) 500 MG tablet Take 1 tablet (500 mg total) by mouth every 8 (eight) hours for 10 days. 30 tablet 0   ibuprofen (ADVIL) 800 MG tablet Take 1 tablet (800 mg total) by mouth every 8 (eight) hours for 10 days. Please take with food, please alternate with acetaminophen 30 tablet  0   oxyCODONE (ROXICODONE) 5 MG immediate release tablet Take 1 tablet (5 mg total) by mouth every 4 (four) hours as needed for severe pain or breakthrough pain. 10 tablet 0   ALPRAZolam (XANAX) 1 MG tablet TAKE 1/2 TABLET TO 1 TABLET TWICE DAILY AS NEEDED USE SPARINGLY FOR ANXIETY 45 tablet 2   amLODipine (NORVASC) 5 MG tablet Take 1 tablet (5 mg total) by mouth daily. 90 tablet 1   omeprazole (PRILOSEC OTC) 20 MG tablet Take 20 mg by mouth daily.       ondansetron (ZOFRAN) 8 MG tablet TAKE 1 TABLET BY MOUTH EVERY 8 HOURS AS NEEDED FOR NAUSEA 15 tablet 0   potassium chloride (KLOR-CON M) 10 MEQ tablet Take 1 tablet (10 mEq total) by mouth daily for 15 days. 15 tablet 0               Current Facility-Administered Medications  Medication Dose Route Frequency Provider Last Rate Last Admin   0.9 %  sodium chloride infusion  500 mL Intravenous Once Charlie Pitter III, MD          Imaging Results (Last 48 hours)  No results found.     Review of Systems:   A ROS was performed including pertinent positives and negatives as documented in the HPI.   Physical Exam :   Constitutional: NAD and appears  stated age Neurological: Alert and oriented Psych: Appropriate affect and cooperative There were no vitals taken for this visit.    Comprehensive Musculoskeletal Exam:     Inspection Right Left  Skin No atrophy or gross abnormalities appreciated No atrophy or gross abnormalities appreciated  Palpation      Tenderness None None  Crepitus None None  Range of Motion      Flexion (passive) 160 160  Flexion (active) 160 160  Extension 0 0  Pronation normal normal  Supination normal normal   Strength      Flexion  5/5 5/5  Extension 5/5 5/5  Pronation  5/5 5/5  Supination  5/5 5/5  Special Tests      Lateral epicondyle pain with resisted wrist extension Positive Negative  Medial epicondyle pain with resisted wrist flexion  Negative Negative  Tinel test over cubital tunnel  Negative  Negative   Instability      Generalized Laxity No No  Varus stress No instability No instability  Valgus stress  No instability No instability  Reflexes      Triceps Normal (2/4) Normal (2/4)  Brachioradialis Normal (2/4) Normal (2/4)  Biceps Normal (2/4) Normal (2/4)  Neurologic      Fires PIN, radial, median, ulnar, musculocutaneous, axillary, suprascapular, long thoracic, and spinal accessory innervated muscles. No abnormal sensibility.  Vascular/Lymphatic      Radial Pulse 2+ 2+  Cervical Exam      Patient has symmetric cervical range of motion with Negative Spurling's test.      Lungs CTAB No m/g/r   Imaging:   Xray (3 views right elbow): Normal   MRI (right elbow): Tear of the common extensor origin of the lateral epicondyle   I personally reviewed and interpreted the radiographs.     Assessment:   52 y.o. female right dominant with a lateral epicondyle or common extensor origin tear.  Today's visit I did discuss that she has trialed physical therapy as well as an injection.  None of this has given her relief.  To that effect we did discuss the role for debridement repair.  I did  discuss the risks and limitations associated with this.  We did discuss the specific timing of the rehab.  After discussion she has elected for right lateral epicondylar debridement and repair   Plan :     -Plan for right lateral epicondylar debridement and repair     After a lengthy discussion of treatment options, including risks, benefits, alternatives, complications of surgical and nonsurgical conservative options, the patient elected surgical repair.    The patient  is aware of the material risks  and complications including, but not limited to injury to adjacent structures, neurovascular injury, infection, numbness, bleeding, implant failure, thermal burns, stiffness, persistent pain, failure to heal, disease transmission from allograft, need for further surgery, dislocation, anesthetic risks, blood clots, risks of death,and others. The probabilities of surgical success and failure discussed with patient given their particular co-morbidities.The time and nature of expected rehabilitation and recovery was discussed.The patient's questions were all answered preoperatively.  No barriers to understanding were noted. I explained the natural history of the disease process and Rx rationale.  I explained to the patient what I considered to be reasonable expectations given their personal situation.  The final treatment plan was arrived at through a shared patient decision making process model.           I personally saw and evaluated the patient, and participated in the management and treatment plan.   Huel Cote, MD Attending Physician, Orthopedic Surgery   This document was dictated using Dragon voice recognition software. A reasonable attempt at proof reading has been made to minimize errors.

## 2023-02-24 NOTE — Anesthesia Preprocedure Evaluation (Signed)
Anesthesia Evaluation  Patient identified by MRN, date of birth, ID band Patient awake    Reviewed: Allergy & Precautions, H&P , NPO status , Patient's Chart, lab work & pertinent test results, reviewed documented beta blocker date and time   Airway Mallampati: I  TM Distance: >3 FB Neck ROM: full    Dental  (+) Teeth Intact, Dental Advidsory Given   Pulmonary neg pulmonary ROS, Continuous Positive Airway Pressure Ventilation    Pulmonary exam normal breath sounds clear to auscultation       Cardiovascular Exercise Tolerance: Good hypertension, (-) angina (-) Past MI and (-) Cardiac Stents Normal cardiovascular exam(-) Valvular Problems/Murmurs Rhythm:regular Rate:Normal     Neuro/Psych  PSYCHIATRIC DISORDERS Anxiety Depression    negative neurological ROS     GI/Hepatic Neg liver ROS,GERD  ,,  Endo/Other  negative endocrine ROS    Renal/GU negative Renal ROS  negative genitourinary   Musculoskeletal   Abdominal   Peds  Hematology negative hematology ROS (+)   Anesthesia Other Findings Past Medical History: No date: Anxiety No date: Back problem No date: Bursitis of shoulder, right No date: Depression 2017: Elevated hemoglobin A1c No date: Hypertension No date: Low vitamin D level No date: Migraines No date: Uterine fibroid   Reproductive/Obstetrics negative OB ROS                             Anesthesia Physical Anesthesia Plan  ASA: 2  Anesthesia Plan: General and Regional   Post-op Pain Management:    Induction: Intravenous  PONV Risk Score and Plan: 3 and Ondansetron, Dexamethasone, Midazolam and Treatment may vary due to age or medical condition  Airway Management Planned: LMA  Additional Equipment:   Intra-op Plan:   Post-operative Plan: Extubation in OR  Informed Consent: I have reviewed the patients History and Physical, chart, labs and discussed the procedure  including the risks, benefits and alternatives for the proposed anesthesia with the patient or authorized representative who has indicated his/her understanding and acceptance.     Dental Advisory Given  Plan Discussed with: Anesthesiologist, CRNA and Surgeon  Anesthesia Plan Comments: (Consented for post-op block)       Anesthesia Quick Evaluation

## 2023-02-24 NOTE — Anesthesia Postprocedure Evaluation (Signed)
Anesthesia Post Note  Patient: Carrie Gordon  Procedure(s) Performed: Right elbow lateral epicondyle debridement and repair (Right: Elbow)  Patient location during evaluation: PACU Anesthesia Type: Regional Level of consciousness: awake and alert Pain management: pain level controlled Vital Signs Assessment: post-procedure vital signs reviewed and stable Respiratory status: spontaneous breathing, nonlabored ventilation, respiratory function stable and patient connected to nasal cannula oxygen Cardiovascular status: blood pressure returned to baseline and stable Postop Assessment: no apparent nausea or vomiting Anesthetic complications: no  No notable events documented.   Last Vitals:  Vitals:   02/24/23 1647 02/24/23 1658  BP: (!) 117/91 118/60  Pulse: 84 78  Resp: 15 16  Temp: (!) 36.2 C (!) 36.1 C  SpO2: 100% 98%    Last Pain:  Vitals:   02/24/23 1658  TempSrc: Temporal  PainSc:                  Stephanie Coup

## 2023-02-24 NOTE — Discharge Instructions (Addendum)
Discharge Instructions    Attending Surgeon: Huel Cote, MD Office Phone Number: [phone]   Diagnosis and Procedures:    Surgeries Performed: Right lateral epicondyle debridement and repair  Discharge Plan:    Diet: Resume usual diet. Begin with light or bland foods.  Drink plenty of fluids.  Activity:  You may use your right arm as tolerated, no lifting more than 5 pounds You are advised to go home directly from the hospital or surgical center. Restrict your activities.  GENERAL INSTRUCTIONS: 1.  Keep your surgical site elevated above your heart for at least 5-7 days or longer to prevent swelling. This will improve your comfort and your overall recovery following surgery.     2. Please call Dr. Serena Croissant office at [phone] with questions Monday-Friday during business hours. If no one answers, please leave a message and someone should get back to the patient within 24 hours. For emergencies please call 911 or proceed to the emergency room.   3. Patient to notify surgical team if experiences any of the following: Bowel/Bladder dysfunction, uncontrolled pain, nerve/muscle weakness, incision with increased drainage or redness, nausea/vomiting and Fever greater than 101.0 F.  Be alert for signs of infection including redness, streaking, odor, fever or chills. Be alert for excessive pain or bleeding and notify your surgeon immediately.  WOUND INSTRUCTIONS:   Leave your dressing/cast/splint in place until your post operative visit.  Keep it clean and dry.  Always keep the incision clean and dry until the staples/sutures are removed. If there is no drainage from the incision you should keep it open to air. If there is drainage from the incision you must keep it covered at all times until the drainage stops  Do not soak in a bath tub, hot tub, pool, lake or other body of water until 21 days after your surgery and your incision is completely dry and healed.  If you have  removable sutures (or staples) they must be removed 10-14 days (unless otherwise instructed) from the day of your surgery.     1)  Elevate the extremity as much as possible.  2)  Keep the dressing clean and dry.  3)  Please call us if the dressing becomes wet or dirty.  4)  If you are experiencing worsening pain or worsening swelling, please call.     MEDICATIONS: Resume all previous home medications at the previous prescribed dose and frequency unless otherwise noted Start taking the  pain medications on an as-needed basis as prescribed  Please taper down pain medication over the next week following surgery.  Ideally you should not require a refill of any narcotic pain medication.  Take pain medication with food to minimize nausea. In addition to the prescribed pain medication, you may take over-the-counter pain relievers such as Tylenol.  Do NOT take additional tylenol if your pain medication already has tylenol in it.  Aspirin 325mg  daily for four weeks.      FOLLOWUP INSTRUCTIONS: 1. Follow up at the Physical Therapy Clinic 3-4 days following surgery. This appointment should be scheduled unless other arrangements have been made.The Physical Therapy scheduling number is [phone] if an appointment has not already been arranged.  2. Contact Dr. Serena Croissant office during office hours at [phone] or the practice after hours line at [phone] for non-emergencies. For medical emergencies call 911.   Discharge Location: Home  Surgery is complete. Please report to the surgery info. desk to speak with the surgeon.  If you  are not in the hospital, the surgeon will call you. AMBULATORY SURGERY  DISCHARGE INSTRUCTIONS   The drugs that you were given will stay in your system until tomorrow so for the next 24 hours you should not:  Drive an automobile Make any legal decisions Drink any alcoholic beverage   You may resume regular meals tomorrow.  Today it is better to start with  liquids and gradually work up to solid foods.  You may eat anything you prefer, but it is better to start with liquids, then soup and crackers, and gradually work up to solid foods.   Please notify your doctor immediately if you have any unusual bleeding, trouble breathing, redness and pain at the surgery site, drainage, fever, or pain not relieved by medication.    Additional Instructions:        Please contact your physician with any problems or Same Day Surgery at [phone], Monday through Friday 6 am to 4 pm, or [place] [facility] number at [phone].

## 2023-02-25 ENCOUNTER — Encounter: Payer: Self-pay | Admitting: Orthopaedic Surgery

## 2023-03-03 ENCOUNTER — Other Ambulatory Visit (HOSPITAL_BASED_OUTPATIENT_CLINIC_OR_DEPARTMENT_OTHER): Payer: Self-pay | Admitting: Orthopaedic Surgery

## 2023-03-03 ENCOUNTER — Other Ambulatory Visit (HOSPITAL_BASED_OUTPATIENT_CLINIC_OR_DEPARTMENT_OTHER): Payer: Self-pay

## 2023-03-03 MED ORDER — OXYCODONE HCL 5 MG PO TABS
5.0000 mg | ORAL_TABLET | ORAL | 0 refills | Status: DC | PRN
  Filled 2023-03-03: qty 10, 2d supply, fill #0

## 2023-03-03 MED ORDER — ACETAMINOPHEN 500 MG PO TABS
500.0000 mg | ORAL_TABLET | Freq: Three times a day (TID) | ORAL | 0 refills | Status: AC
  Filled 2023-03-03: qty 30, 10d supply, fill #0

## 2023-03-04 ENCOUNTER — Other Ambulatory Visit (HOSPITAL_BASED_OUTPATIENT_CLINIC_OR_DEPARTMENT_OTHER): Payer: Self-pay

## 2023-03-10 ENCOUNTER — Ambulatory Visit (INDEPENDENT_AMBULATORY_CARE_PROVIDER_SITE_OTHER): Payer: No Typology Code available for payment source | Admitting: Orthopaedic Surgery

## 2023-03-10 DIAGNOSIS — G5601 Carpal tunnel syndrome, right upper limb: Secondary | ICD-10-CM

## 2023-03-10 DIAGNOSIS — S56511A Strain of other extensor muscle, fascia and tendon at forearm level, right arm, initial encounter: Secondary | ICD-10-CM

## 2023-03-10 NOTE — Progress Notes (Signed)
Post Operative Evaluation    Procedure/Date of Surgery: Right lateral epicondyle debridement and repair 10/14  Interval History:    Presents today 2 weeks status post the above procedure.  Overall she is doing very well.  She states that she has having some sensitivity and some weakness in the arm but overall is continuing to improve.     PMH/PSH/Family History/Social History/Meds/Allergies:    Past Medical History:  Diagnosis Date   Anxiety    Back problem    Bursitis of shoulder, right    Depression    Elevated hemoglobin A1c 2017   Hypertension    Low vitamin D level    Migraines    Uterine fibroid    Past Surgical History:  Procedure Laterality Date   ABDOMINAL HYSTERECTOMY  2010   [facility]--Dr. Edward Jolly   MYOMECTOMY  2006   -Dr. Jamelle Haring ELBOW RELEASE/NIRSCHEL PROCEDURE Right 02/24/2023   Procedure: Right elbow lateral epicondyle debridement and repair;  Surgeon: Huel Cote, MD;  Location: [facility] ORS;  Service: Orthopedics;  Laterality: Right;   TUBAL LIGATION  2002   -Dr. Edward Jolly   Social History   Socioeconomic History   Marital status: Married    Spouse name: Not on file   Number of children: 1   Years of education: Not on file   Highest education level: Not on file  Occupational History   Not on file  Tobacco Use   Smoking status: Never   Smokeless tobacco: Never  Vaping Use   Vaping status: Never Used  Substance and Sexual Activity   Alcohol use: Yes    Alcohol/week: 1.0 standard drink of alcohol    Types: 1 Glasses of wine per week    Comment: occ   Drug use: No   Sexual activity: Yes    Partners: Male    Birth control/protection: Surgical    Comment: TAH  Other Topics Concern   Not on file  Social History Narrative   Not on file   Social Determinants of Health   Financial Resource Strain: Not on file  Food Insecurity: Not on file  Transportation Needs: Not on file  Physical Activity: Not on file   Stress: Not on file  Social Connections: Not on file   Family History  Adopted: Yes  Problem Relation Age of Onset   Breast cancer Neg Hx    No Known Allergies Current Outpatient Medications  Medication Sig Dispense Refill   acetaminophen (PAIN RELIEF EXTRA STRENGTH) 500 MG tablet Take 1 tablet (500 mg total) by mouth every 8 (eight) hours for 10 days. 30 tablet 0   ALPRAZolam (XANAX) 1 MG tablet TAKE 1/2 TABLET TO 1 TABLET TWICE DAILY AS NEEDED USE SPARINGLY FOR ANXIETY 45 tablet 2   amLODipine (NORVASC) 5 MG tablet Take 1 tablet (5 mg total) by mouth daily. 90 tablet 1   KLOR-CON M10 10 MEQ tablet TAKE 1 TABLET (10 MEQ TOTAL) BY MOUTH DAILY FOR 15 DAYS. 90 tablet 1   ondansetron (ZOFRAN) 8 MG tablet TAKE 1 TABLET BY MOUTH EVERY 8 HOURS AS NEEDED FOR NAUSEA 15 tablet 0   oxyCODONE (ROXICODONE) 5 MG immediate release tablet Take 1 tablet (5 mg total) by mouth every 4 (four) hours as needed for severe pain or breakthrough pain. 10 tablet 0  Current Facility-Administered Medications  Medication Dose Route Frequency Provider Last Rate Last Admin   0.9 %  sodium chloride infusion  500 mL Intravenous Once Charlie Pitter III, MD       No results found.  Review of Systems:   A ROS was performed including pertinent positives and negatives as documented in the HPI.   Musculoskeletal Exam:    There were no vitals taken for this visit.  Right lateral epicondylar incision is well-appearing without erythema or drainage.  Fires extensors of all of her fingers as well as the extensor of the thumb as well as flexors.  Sensation intact in all distributions of the right hand  Imaging:      I personally reviewed and interpreted the radiographs.   Assessment:   2-week status post right lateral epicondyle debridement repair doing very well.  At this time I would like her to begin occupational therapy for strengthening and range of motion.  I will plan to see her back in 4 weeks for  reassessment  Plan :    -Return to clinic 4 weeks for reassessment      I personally saw and evaluated the patient, and participated in the management and treatment plan.  Huel Cote, MD Attending Physician, Orthopedic Surgery  This document was dictated using Dragon voice recognition software. A reasonable attempt at proof reading has been made to minimize errors.

## 2023-03-13 ENCOUNTER — Other Ambulatory Visit: Payer: Self-pay

## 2023-03-13 ENCOUNTER — Encounter: Payer: Self-pay | Admitting: Rehabilitative and Restorative Service Providers"

## 2023-03-13 ENCOUNTER — Ambulatory Visit (INDEPENDENT_AMBULATORY_CARE_PROVIDER_SITE_OTHER): Payer: PRIVATE HEALTH INSURANCE | Admitting: Rehabilitative and Restorative Service Providers"

## 2023-03-13 ENCOUNTER — Encounter: Payer: PRIVATE HEALTH INSURANCE | Admitting: Rehabilitative and Restorative Service Providers"

## 2023-03-13 DIAGNOSIS — R202 Paresthesia of skin: Secondary | ICD-10-CM

## 2023-03-13 DIAGNOSIS — R278 Other lack of coordination: Secondary | ICD-10-CM

## 2023-03-13 DIAGNOSIS — R600 Localized edema: Secondary | ICD-10-CM

## 2023-03-13 DIAGNOSIS — M6281 Muscle weakness (generalized): Secondary | ICD-10-CM

## 2023-03-13 DIAGNOSIS — M25621 Stiffness of right elbow, not elsewhere classified: Secondary | ICD-10-CM

## 2023-03-13 DIAGNOSIS — M25521 Pain in right elbow: Secondary | ICD-10-CM | POA: Diagnosis not present

## 2023-03-13 NOTE — Therapy (Signed)
OUTPATIENT OCCUPATIONAL THERAPY ORTHO EVALUATION  Patient Name: Carrie Gordon MRN: [id number] DOB:Dec 03, 1970, 52 y.o., female Today's Date: 03/13/2023  PCP: Lilyan Punt, MD REFERRING PROVIDER:  Huel Cote, MD    END OF SESSION:  OT End of Session - 03/13/23 1517     Visit Number 1    Number of Visits 14    Date for OT Re-Evaluation 05/09/23    Authorization Type "Generic"    OT Start Time 1518    OT Stop Time 1617    OT Time Calculation (min) 59 min    Equipment Utilized During Treatment compressive arm sleeve    Activity Tolerance Patient tolerated treatment well;Patient limited by fatigue;Patient limited by pain;Treatment limited secondary to agitation    Behavior During Therapy Anxious;Restless;Agitated             Past Medical History:  Diagnosis Date   Anxiety    Back problem    Bursitis of shoulder, right    Depression    Elevated hemoglobin A1c 2017   Hypertension    Low vitamin D level    Migraines    Uterine fibroid    Past Surgical History:  Procedure Laterality Date   ABDOMINAL HYSTERECTOMY  2010   [facility]--Dr. Edward Jolly   MYOMECTOMY  2006   -Dr. Jamelle Haring ELBOW RELEASE/NIRSCHEL PROCEDURE Right 02/24/2023   Procedure: Right elbow lateral epicondyle debridement and repair;  Surgeon: Huel Cote, MD;  Location: [facility];  Service: Orthopedics;  Laterality: Right;   TUBAL LIGATION  2002   -Dr. Edward Jolly   Patient Active Problem List   Diagnosis Date Noted   Lateral epicondylitis, right elbow 02/24/2023   Primary hypertension 07/02/2021   Near syncope 12/31/2019   Other fatigue 12/31/2019   Anxiety 08/26/2017   Insomnia 12/19/2016   Migraines 12/06/2014    ONSET DATE: DOS 02/24/23  REFERRING DIAG: G56.01 (ICD-10-CM) - Carpal tunnel syndrome, right upper limb   THERAPY DIAG:  Pain in right elbow - Plan: Ot plan of care cert/re-cert  Localized edema - Plan: Ot plan of care cert/re-cert  Muscle weakness (generalized) -  Plan: Ot plan of care cert/re-cert  Other lack of coordination - Plan: Ot plan of care cert/re-cert  Paresthesia of skin - Plan: Ot plan of care cert/re-cert  Stiffness of right elbow, not elsewhere classified - Plan: Ot plan of care cert/re-cert  Rationale for Evaluation and Treatment: Rehabilitation  SUBJECTIVE:   SUBJECTIVE STATEMENT: She is 2+ weeks s/p right lateral epicondyle debridement, repair.  She states having Rt elbow pain for about 6 months and Rt hand numbness for about 1-2 months.   She was seeing Dr. Shon Baton for numbness and was sent to Dr. Steward Drone for elbow surgery.   Dr. Shon Baton plans on seeing her back for R CTS  04/07/23.   She had a syncopal episode today during her evaluation which limited the evaluation greatly.  More information on this is listed under the "objective" section below.  Her husband was present for most of the evaluation today.   PERTINENT HISTORY:  She states having Rt elbow pain for about 6 months and Rt hand numbness for about 1-2 months.   She was seeing Dr. Shon Baton for numbness and was sent to Dr. Steward Drone for elbow surgery.   Dr. Shon Baton plans on seeing her back for R CTS  04/07/23.   PRECAUTIONS: Other: anxiety and syncopal episodes  RED FLAGS: None   WEIGHT BEARING RESTRICTIONS: Yes NWB in Rt arm now and for  next 4-6 weeks   PAIN:  Are you having pain? Yes: NPRS scale: 6/10 Pain location: Rt lat elbow Pain description: aching, sharp pinpoints  Aggravating factors: movement, weightbearing Relieving factors: rest, ice  FALLS: Has patient fallen in last 6 months? No  LIVING ENVIRONMENT: Lives with: lives with their spouse Has following equipment at home: None  PLOF: Independent  PATIENT GOALS: To improve use of her right dominant arm  NEXT MD VISIT: Approximately 1 month no appointment has been made yet    OBJECTIVE: (All objective assessments below are from initial evaluation on: 03/13/23 unless otherwise specified.)   HAND  DOMINANCE: Right   ADLs: Overall ADLs: States decreased ability to grab, hold household objects, pain and difficulty to open containers, perform FMS tasks (manipulate fasteners on clothing), mild to moderate bathing problems as well.    FUNCTIONAL OUTCOME MEASURES: Eval: Quick DASH TBD% impairment today  (Higher % Score  =  More Impairment)   (will obtain score next session unable today as she was having a syncopal episode)   UPPER EXTREMITY ROM    Eval: OT observed that she was able to move her right elbow approximately from 130 degrees of flexion to (-30*) extension.  Detailed measures were unable to be taken as she had a syncopal episode and much time was spent with her recovery today.  Details will be tested as soon as possible and tolerated  Shoulder to Wrist AROM Right TBD  Shoulder flexion   Shoulder abduction   Shoulder extension   Shoulder internal rotation   Shoulder external rotation   Elbow flexion   Elbow extension   Forearm supination   Forearm pronation    Wrist flexion   Wrist extension   Wrist ulnar deviation   Wrist radial deviation   Functional dart thrower's motion (F-DTM) in ulnar flexion   F-DTM in radial extension    (Blank rows = not tested)   Hand AROM Right TBD  Full Fist Ability (or Gap to Distal Palmar Crease)   Thumb Opposition  (Kapandji Scale)    Thumb MCP (0-60)   Thumb IP (0-80)   Thumb Radial Abduction Span   Thumb Palmar Abduction Span   (Blank rows = not tested)   UPPER EXTREMITY MMT:     MMT Right TBD  Thumb IP flexion   Shoulder abduction   Shoulder adduction   Shoulder extension   Shoulder internal rotation   Shoulder external rotation   Middle trapezius   Lower trapezius   Elbow flexion   Elbow extension   Forearm supination   Forearm pronation   Wrist flexion   Wrist extension   Wrist ulnar deviation   Wrist radial deviation   (Blank rows = not tested)  HAND FUNCTION: Eval: Observed weakness in affected right  hand.  Details will be tested when able and appropriate  COORDINATION: Eval: Observed coordination impairments with affected right hand.  Details will be tested when able and appropriate  SENSATION: Eval:  Light touch intact today, though she states some paresthesia in the right hand especially after waking up from her syncopal episode.   EDEMA:   Eval:  Mildly swollen in right elbow today  COGNITION: Eval: Overall cognitive status: WFL for evaluation today, though very anxious   OBSERVATIONS:   Eval: While OT was discussing range of motion exercises with her, she loses consciousness during therapy momentarily.  She stated feeling a panic attack happening and took some form of pill and then OT stretched her  husband from the lobby.  Shortly after she seemed to lose consciousness for about 5 minutes.  She was breathing and her heart was beating and OT was attempting to take a blood pressure.  He states that she has passed out like this before.  When she regains consciousness her blood pressure was measured at 74/49 mm Hg, and after some rest and food, it went up to 95/74 mmHg.  She does state not eating at all today so far and is almost 4 PM at this time.  She seemed to recover after about 15 minutes, but the evaluation could not be performed thoroughly due to this episode.  They denied need for EMS or to go to the ED, so were recommended to see her PCP about this ASAP-preferably this or next week.    TODAY'S TREATMENT:  Post-evaluation treatment:   OT had to use his clinical skills and expertise to monitor the patient as she lost consciousness, make informed medical decisions as whether or not to activate the EMS system, and communicate with her and her husband about this inherent safety problem.  Once she had regained alertness, her and her husband were both given this exercise program that she can begin immediately as tolerated and nonpainfully.  She was educated to also rub and touch her scar  and get "comfortable mentally" with looking at her surgical site and touching it.  Again she was encouraged to not hurt herself, perform no weightbearing, and to wear her wrist cock up brace all the time (except hygiene and exercises) to take pressure off her lateral epicondyle (as she had no form of bracing today and was in relatively high pain).  Exercises - Reach arms upward   - 4 x daily - 10 reps - Standing Elbow Flexion Extension AROM  - 4-6 x daily - 10-15 reps - Turn [employer] Facing Up & Down  - 4-6 x daily - 10-15 reps - Bend and Pull Back Wrist SLOWLY  - 4 x daily - 10-15 reps - Tendon Glides  - 4-6 x daily - 3-5 reps - 2-3 seconds hold - Thumb Opposition  - 4-6 x daily - 10 reps  Patient Education - Scar Massage    PATIENT EDUCATION: Education details: See tx section above for details  Person educated: Patient Education method: Verbal Instruction, Teach back, Handouts  Education comprehension: States and demonstrates understanding, Additional Education required    HOME EXERCISE PROGRAM: Access Code: [email or web address] URL: [email or web address] Date: 03/13/2023 Prepared by: Fannie Knee   GOALS: Goals reviewed with patient? Yes   SHORT TERM GOALS: (STG required if POC>30 days) Target Date: 03/28/23  Pt will obtain protective, custom orthotic. Goal status: TBD/PRN  2.  Pt will demo/state understanding of initial HEP to improve pain levels and prerequisite motion. Goal status: INITIAL   LONG TERM GOALS: Target Date: 05/09/23  Pt will improve functional ability by decreased impairment per Quick DASH assessment from TBD to TBD or better, for better quality of life. Goal status: INITIAL baseline will be taken next session as able  2.  Pt will improve grip strength in right dominant hand to at least 40lbs for functional use at home and in IADLs. Goal status: INITIAL baseline will be taken next session as able  3.  Pt will improve A/ROM in right wrist  flexion and extension from TBD to at least 65* each, to have functional motion for tasks like reach and grasp.  Goal status: INITIAL baseline will be taken next session as  able  4.  Pt will improve strength in left elbow flexion and extension from 3-/5 MMT to at least 4+/5 MMT to have increased functional ability to carry out selfcare and higher-level homecare tasks with less difficulty. Goal status: INITIAL  5.  Pt will improve coordination skills in right dominant arm, as seen by better score on box and blocks testing to have increased functional ability to carry out fine motor tasks (fasteners, etc.) and more complex, coordinated IADLs (meal prep, sports, etc.).  Goal status: INITIAL baseline will be taken next session as able  6.  Pt will decrease pain at rest from 6/10 to 2/10 or better to have better sleep and occupational participation in daily roles. Goal status: INITIAL   ASSESSMENT:  CLINICAL IMPRESSION: Evaluation: Patient is a 52 y.o. female who was seen today for occupational therapy evaluation for right dominant arm elbow pain, stiffness, weakness and decreased functional ability from chronic lateral epicondylitis and recent lateral epicondylectomy surgery.  She also has problems with paresthesia in the right dominant hand thought to be carpal tunnel syndrome.  Unfortunately she also had a syncopal episode today that was likely a vasovagal response/panic attack related.  She and her husband denied the need to have EMS activated or go to the ED, but was recommended to see her doctor about this immediately as this has happened before and could be a sign of other medical problems.  As long as she can stay conscious, she will benefit from outpatient occupational therapy to improve functional ability with her right dominant arm.    PERFORMANCE DEFICITS: in functional skills including ADLs, IADLs, coordination, sensation, edema, ROM, strength, pain, fascial restrictions, muscle spasms,  flexibility, Gross motor control, body mechanics, endurance, decreased knowledge of precautions, wound, and UE functional use, cognitive skills including attention, consciousness, problem solving, and safety awareness, and psychosocial skills including coping strategies, environmental adaptation, habits, and routines and behaviors.   IMPAIRMENTS: are limiting patient from ADLs, IADLs, rest and sleep, work, leisure, and social participation.   COMORBIDITIES: may have co-morbidities  that affects occupational performance. Patient will benefit from skilled OT to address above impairments and improve overall function.  MODIFICATION OR ASSISTANCE TO COMPLETE EVALUATION: Min-Moderate modification of tasks or assist with assess necessary to complete an evaluation.  OT OCCUPATIONAL PROFILE AND HISTORY: Problem focused assessment: Including review of records relating to presenting problem.  CLINICAL DECISION MAKING: LOW - limited treatment options, no task modification necessary  REHAB POTENTIAL: Good  EVALUATION COMPLEXITY: Low      PLAN:  OT FREQUENCY: 1-2x/week  OT DURATION: 8 weeks through 05/09/2023 and up to 14 visits as needed  PLANNED INTERVENTIONS: 97168 OT Re-evaluation, 97535 self care/ADL training, 24401 therapeutic exercise, 97530 therapeutic activity, 97112 neuromuscular re-education, 97140 manual therapy, 97035 ultrasound, 97010 moist heat, 97010 cryotherapy, 97032 electrical stimulation (manual), 97760 Orthotics management and training, 02725 Splinting (initial encounter), [id number] Subsequent splinting/medication, scar mobilization, compression bandaging, psychosocial skills training, energy conservation, coping strategies training, and patient/family education  RECOMMENDED OTHER SERVICES: None now  CONSULTED AND AGREED WITH PLAN OF CARE: Patient and family member/caregiver  PLAN FOR NEXT SESSION:   Try to take detailed measures and review home exercise program.  Be cautious  about anxiety and syncopal episodes   Fannie Knee, OTR/L, CHT 03/13/2023, 4:40 PM

## 2023-03-19 NOTE — Therapy (Signed)
OUTPATIENT OCCUPATIONAL THERAPY TREATMENT NOTE  Patient Name: Carrie Gordon MRN: [id number] DOB:1971-03-03, 52 y.o., female Today's Date: 03/20/2023  PCP: Lilyan Punt, MD REFERRING PROVIDER:  Huel Cote, MD    END OF SESSION:  OT End of Session - 03/20/23 1516     Visit Number 2    Number of Visits 14    Date for OT Re-Evaluation 05/09/23    Authorization Type "Generic"    OT Start Time 1515    OT Stop Time 1547    OT Time Calculation (min) 32 min    Equipment Utilized During Treatment --    Activity Tolerance Patient tolerated treatment well;Patient limited by fatigue;Patient limited by pain;No increased pain    Behavior During Therapy [facility] for tasks assessed/performed              Past Medical History:  Diagnosis Date   Anxiety    Back problem    Bursitis of shoulder, right    Depression    Elevated hemoglobin A1c 2017   Hypertension    Low vitamin D level    Migraines    Uterine fibroid    Past Surgical History:  Procedure Laterality Date   ABDOMINAL HYSTERECTOMY  2010   [facility]--Dr. Edward Jolly   MYOMECTOMY  2006   -Dr. Jamelle Haring ELBOW RELEASE/NIRSCHEL PROCEDURE Right 02/24/2023   Procedure: Right elbow lateral epicondyle debridement and repair;  Surgeon: Huel Cote, MD;  Location: [facility] ORS;  Service: Orthopedics;  Laterality: Right;   TUBAL LIGATION  2002   -Dr. Edward Jolly   Patient Active Problem List   Diagnosis Date Noted   Lateral epicondylitis, right elbow 02/24/2023   Primary hypertension 07/02/2021   Near syncope 12/31/2019   Other fatigue 12/31/2019   Anxiety 08/26/2017   Insomnia 12/19/2016   Migraines 12/06/2014    ONSET DATE: DOS 02/24/23  REFERRING DIAG: G56.01 (ICD-10-CM) - Carpal tunnel syndrome, right upper limb   THERAPY DIAG:  Localized edema  Muscle weakness (generalized)  Other lack of coordination  Pain in right elbow  Paresthesia of skin  Stiffness of right elbow, not elsewhere classified  Pain in  right arm  Rationale for Evaluation and Treatment: Rehabilitation  PERTINENT HISTORY:  She states having Rt elbow pain for about 6 months and Rt hand numbness for about 1-2 months.   She was seeing Dr. Shon Baton for numbness and was sent to Dr. Steward Drone for elbow surgery.   Dr. Shon Baton plans on seeing her back for R CTS  04/07/23.    She states having Rt elbow pain for about 6 months and Rt hand numbness for about 1-2 months.   She was seeing Dr. Shon Baton for numbness and was sent to Dr. Steward Drone for elbow surgery.   Dr. Shon Baton plans on seeing her back for R CTS  04/07/23.   She had a syncopal episode today during her evaluation which limited the evaluation greatly.  More information on this is listed under the "objective" section below. Her husband was present for most of the evaluation.  PRECAUTIONS: Other: anxiety and syncopal episodes ; RED FLAGS: None   WEIGHT BEARING RESTRICTIONS: Yes NWB in Rt arm now and for next 4-6 weeks    SUBJECTIVE:   SUBJECTIVE STATEMENT: She is  3+ weeks s/p right lateral epicondyle debridement, repair.  She states some soreness, no more syncopal episodes and she set up an MD appt about that.. She can sleep in her bed again. She is wearing wrist cock-up today to help  protect her healing elbow.     PAIN:  Are you having pain? Yes: NPRS scale: 4-5/10 Pain location: Rt lat elbow Pain description: aching, sharp pinpoints  Aggravating factors: movement, weightbearing Relieving factors: rest, ice   PATIENT GOALS: To improve use of her right dominant arm  NEXT MD VISIT: Approximately 1 month no appointment has been made yet    OBJECTIVE: (All objective assessments below are from initial evaluation on: 03/13/23 unless otherwise specified.)   HAND DOMINANCE: Right   ADLs: Overall ADLs: States decreased ability to grab, hold household objects, pain and difficulty to open containers, perform FMS tasks (manipulate fasteners on clothing), mild to moderate bathing  problems as well.    FUNCTIONAL OUTCOME MEASURES: 03/20/23: Quick DASH 75% impairment today  (Higher % Score  =  More Impairment)     UPPER EXTREMITY ROM    Eval: OT observed that she was able to move her right elbow approximately from 130 degrees of flexion to (-30*) extension.  Detailed measures were unable to be taken as she had a syncopal episode and much time was spent with her recovery today.  Details will be tested as soon as possible and tolerated  Shoulder to Wrist AROM Right 03/20/23  Shoulder flexion   Shoulder abduction   Shoulder extension   Shoulder internal rotation   Shoulder external rotation   Elbow flexion 126  Elbow extension 0  Forearm supination 55  Forearm pronation  37  Wrist flexion 68  Wrist extension 50  (Blank rows = not tested)   Hand AROM Right 03/20/23  Full Fist Ability (or Gap to Distal Palmar Crease) Full but tight  Thumb Opposition  (Kapandji Scale)  10/10 but tight  (Blank rows = not tested)   UPPER EXTREMITY MMT:     MMT Right TBD  Thumb IP flexion   Shoulder abduction   Shoulder adduction   Shoulder extension   Shoulder internal rotation   Shoulder external rotation   Middle trapezius   Lower trapezius   Elbow flexion   Elbow extension   Forearm supination   Forearm pronation   Wrist flexion   Wrist extension   Wrist ulnar deviation   Wrist radial deviation   (Blank rows = not tested)  HAND FUNCTION: Eval: Observed weakness in affected right hand.  Details will be tested when able and appropriate  COORDINATION: Eval: Observed coordination impairments with affected right hand.  Details will be tested when able and appropriate  SENSATION: 03/20/23:  States some tingling through back of hand and fingers, in radial nerve distribution, mostly.   Eval:  Light touch intact today, though she states some paresthesia in the right hand especially after waking up from her syncopal episode.   EDEMA:   Eval:  Mildly swollen in  right elbow today  COGNITION: Eval: Overall cognitive status: WFL for evaluation today, though very anxious   OBSERVATIONS:   Eval: While OT was discussing range of motion exercises with her, she loses consciousness during therapy momentarily.  She stated feeling a panic attack happening and took some form of pill and then OT stretched her husband from the lobby.  Shortly after she seemed to lose consciousness for about 5 minutes.  She was breathing and her heart was beating and OT was attempting to take a blood pressure.  He states that she has passed out like this before.  When she regains consciousness her blood pressure was measured at 74/49 mm Hg, and after some rest and food,  it went up to 95/74 mmHg.  She does state not eating at all today so far and is almost 4 PM at this time.  She seemed to recover after about 15 minutes, but the evaluation could not be performed thoroughly due to this episode.  They denied need for EMS or to go to the ED, so were recommended to see her PCP about this ASAP-preferably this or next week.    TODAY'S TREATMENT:  03/20/23:  OT does a blood pressure check to begin and it's: 125/49mmHg.   She's feeling better today.  She performs AROM for exercises and detailed measures which are her new baselines today.  We also discuss her home function and ability through the Quick DASH and discuss activities that are safe for her to perform with wrist brace on. She should be cautious with anything >3# and that is repetitive.  OT then reviews her HEP with her, and she performs each exercise back to OT with supervision for correct form, etc.  She can also use moist heat now that she is 3+ weeks post op before exercises to loosen up as needed.   Exercises - Reach arms upward   - 4 x daily - 10 reps - Standing Elbow Flexion Extension AROM  - 4-6 x daily - 10-15 reps - Turn [employer] Facing Up & Down  - 4-6 x daily - 10-15 reps - Bend and Pull Back Wrist SLOWLY  - 4 x daily - 10-15  reps - Tendon Glides  - 4-6 x daily - 3-5 reps - 2-3 seconds hold - Thumb Opposition  - 4-6 x daily - 10 reps  Patient Education - Scar Massage    PATIENT EDUCATION: Education details: See tx section above for details  Person educated: Patient Education method: Verbal Instruction, Teach back, Handouts  Education comprehension: States and demonstrates understanding, Additional Education required    HOME EXERCISE PROGRAM: Access Code: [email or web address] URL: [email or web address] Date: 03/13/2023 Prepared by: Fannie Knee   GOALS: Goals reviewed with patient? Yes   SHORT TERM GOALS: (STG required if POC>30 days) Target Date: 03/28/23  Pt will obtain protective, custom orthotic. Goal status: TBD/PRN  2.  Pt will demo/state understanding of initial HEP to improve pain levels and prerequisite motion. Goal status: INITIAL   LONG TERM GOALS: Target Date: 05/09/23  Pt will improve functional ability by decreased impairment per Quick DASH assessment from TBD to TBD or better, for better quality of life. Goal status: INITIAL baseline will be taken next session as able  2.  Pt will improve grip strength in right dominant hand to at least 40lbs for functional use at home and in IADLs. Goal status: INITIAL baseline will be taken next session as able  3.  Pt will improve A/ROM in right wrist flexion and extension from TBD to at least 65* each, to have functional motion for tasks like reach and grasp.  Goal status: INITIAL baseline will be taken next session as able  4.  Pt will improve strength in left elbow flexion and extension from 3-/5 MMT to at least 4+/5 MMT to have increased functional ability to carry out selfcare and higher-level homecare tasks with less difficulty. Goal status: INITIAL  5.  Pt will improve coordination skills in right dominant arm, as seen by better score on box and blocks testing to have increased functional ability to carry out fine motor  tasks (fasteners, etc.) and more complex, coordinated IADLs (meal prep, sports, etc.).  Goal status: INITIAL baseline will  be taken next session as able  6.  Pt will decrease pain at rest from 6/10 to 2/10 or better to have better sleep and occupational participation in daily roles. Goal status: INITIAL   ASSESSMENT:  CLINICAL IMPRESSION: 03/20/23: She has now baseline measures and full understanding of basic HEP, we will also need to address some nerve compression issues she was complaining about     PLAN:  OT FREQUENCY: 1-2x/week  OT DURATION: 8 weeks through 05/09/2023 and up to 14 visits as needed  PLANNED INTERVENTIONS: 97168 OT Re-evaluation, 97535 self care/ADL training, 16109 therapeutic exercise, 97530 therapeutic activity, 97112 neuromuscular re-education, 97140 manual therapy, 97035 ultrasound, 97010 moist heat, 97010 cryotherapy, 97032 electrical stimulation (manual), 97760 Orthotics management and training, 60454 Splinting (initial encounter), [id number] Subsequent splinting/medication, scar mobilization, compression bandaging, psychosocial skills training, energy conservation, coping strategies training, and patient/family education  CONSULTED AND AGREED WITH PLAN OF CARE: Patient and family member/caregiver  PLAN FOR NEXT SESSION:   Discuss CTS management, check motion and coping skills, try coordination assessment, etc.    Fannie Knee, OTR/L, CHT 03/20/2023, 5:48 PM

## 2023-03-20 ENCOUNTER — Ambulatory Visit (INDEPENDENT_AMBULATORY_CARE_PROVIDER_SITE_OTHER): Payer: PRIVATE HEALTH INSURANCE | Admitting: Rehabilitative and Restorative Service Providers"

## 2023-03-20 ENCOUNTER — Encounter: Payer: Self-pay | Admitting: Rehabilitative and Restorative Service Providers"

## 2023-03-20 ENCOUNTER — Encounter: Payer: PRIVATE HEALTH INSURANCE | Admitting: Rehabilitative and Restorative Service Providers"

## 2023-03-20 DIAGNOSIS — M6281 Muscle weakness (generalized): Secondary | ICD-10-CM | POA: Diagnosis not present

## 2023-03-20 DIAGNOSIS — R278 Other lack of coordination: Secondary | ICD-10-CM | POA: Diagnosis not present

## 2023-03-20 DIAGNOSIS — M25621 Stiffness of right elbow, not elsewhere classified: Secondary | ICD-10-CM

## 2023-03-20 DIAGNOSIS — M25521 Pain in right elbow: Secondary | ICD-10-CM

## 2023-03-20 DIAGNOSIS — R202 Paresthesia of skin: Secondary | ICD-10-CM

## 2023-03-20 DIAGNOSIS — R600 Localized edema: Secondary | ICD-10-CM | POA: Diagnosis not present

## 2023-03-20 DIAGNOSIS — M79601 Pain in right arm: Secondary | ICD-10-CM

## 2023-03-24 ENCOUNTER — Ambulatory Visit (INDEPENDENT_AMBULATORY_CARE_PROVIDER_SITE_OTHER): Payer: PRIVATE HEALTH INSURANCE | Admitting: Family Medicine

## 2023-03-24 DIAGNOSIS — F419 Anxiety disorder, unspecified: Secondary | ICD-10-CM | POA: Diagnosis not present

## 2023-03-24 MED ORDER — ALPRAZOLAM 1 MG PO TABS
ORAL_TABLET | ORAL | 2 refills | Status: DC
Start: 2023-03-24 — End: 2023-07-09

## 2023-03-24 NOTE — Progress Notes (Signed)
   Subjective:    Patient ID: Carrie Gordon, female    DOB: 06/04/1970, 52 y.o.   MRN: [id number]  HPI Patient has had 3 separate passing out spells over the past year to the spells occurred when she got very anxious and worried and hyperventilated to the point that she passed out and then she came around relatively quickly The other episode occurred when she had a sharp pain in her abdomen that lasted a few seconds made her worried and then she passed out She has had previous surgery recently for right elbow tendinitis She had EKG at that time which showed normal intervals She denies any cardiac symptoms She uses Xanax intermittently 45 tablets lasts her approximately 2 months at a time or longer she states   Review of Systems     Objective:   Physical Exam Lungs are clear hearts regular pulse normal BP good       Assessment & Plan:   Significant anxiety related symptoms 3 separate times of passing out twice due to panic attacks once due to sharp pain in the abdomen which more than likely triggered vasovagal spell In my opinion I believe the patient would benefit from a serotonin reuptake inhibitor She has tried Zoloft in the past and it did not help her and actually caused side effects We did discuss other medications including Cymbalta Prozac and Effexor She will discuss these with her therapist then let us know Otherwise she will continue the Xanax on a as needed basis I do not feel that she has underlying cardiac condition currently If she continues to have these spells cardiac workup would be indicated including telemetry We did discuss warning signs of presyncope follow-up within 4 months sooner if any problems

## 2023-03-25 LAB — VITAMIN D 25 HYDROXY (VIT D DEFICIENCY, FRACTURES): Vit D, 25-Hydroxy: 29.9 ng/mL — ABNORMAL LOW (ref 30.0–100.0)

## 2023-03-25 LAB — BASIC METABOLIC PANEL
BUN/Creatinine Ratio: 11 (ref 9–23)
BUN: 9 mg/dL (ref 6–24)
CO2: 25 mmol/L (ref 20–29)
Calcium: 9.7 mg/dL (ref 8.7–10.2)
Chloride: 102 mmol/L (ref 96–106)
Creatinine, Ser: 0.79 mg/dL (ref 0.57–1.00)
Glucose: 82 mg/dL (ref 70–99)
Potassium: 4.1 mmol/L (ref 3.5–5.2)
Sodium: 143 mmol/L (ref 134–144)
eGFR: 89 mL/min/{1.73_m2} (ref >59)

## 2023-03-25 LAB — HEMOGLOBIN A1C
Est. average glucose Bld gHb Est-mCnc: 137 mg/dL
Hgb A1c MFr Bld: 6.4 % — ABNORMAL HIGH (ref 4.8–5.6)

## 2023-03-25 NOTE — Therapy (Signed)
OUTPATIENT OCCUPATIONAL THERAPY TREATMENT NOTE  Patient Name: Carrie Gordon MRN: [id number] DOB:1970/12/25, 52 y.o., female Today's Date: 03/27/2023  PCP: Lilyan Punt, MD REFERRING PROVIDER:  Huel Cote, MD    END OF SESSION:  OT End of Session - 03/27/23 1523     Visit Number 3    Number of Visits 14    Date for OT Re-Evaluation 05/09/23    Authorization Type "Generic"    OT Start Time 1523    OT Stop Time 1610    OT Time Calculation (min) 47 min    Activity Tolerance Patient tolerated treatment well;Patient limited by fatigue;Patient limited by pain;No increased pain    Behavior During Therapy [facility] for tasks assessed/performed               Past Medical History:  Diagnosis Date   Anxiety    Back problem    Bursitis of shoulder, right    Depression    Elevated hemoglobin A1c 2017   Hypertension    Low vitamin D level    Migraines    Uterine fibroid    Past Surgical History:  Procedure Laterality Date   ABDOMINAL HYSTERECTOMY  2010   TA[facility]--Dr. Edward Jolly   MYOMECTOMY  2006   -Dr. Jamelle Haring ELBOW RELEASE/NIRSCHEL PROCEDURE Right 02/24/2023   Procedure: Right elbow lateral epicondyle debridement and repair;  Surgeon: Huel Cote, MD;  Location: [facility] ORS;  Service: Orthopedics;  Laterality: Right;   TUBAL LIGATION  2002   -Dr. Edward Jolly   Patient Active Problem List   Diagnosis Date Noted   Lateral epicondylitis, right elbow 02/24/2023   Primary hypertension 07/02/2021   Near syncope 12/31/2019   Other fatigue 12/31/2019   Anxiety 08/26/2017   Insomnia 12/19/2016   Migraines 12/06/2014    ONSET DATE: DOS 02/24/23  REFERRING DIAG: G56.01 (ICD-10-CM) - Carpal tunnel syndrome, right upper limb   THERAPY DIAG:  Localized edema  Muscle weakness (generalized)  Other lack of coordination  Pain in right elbow  Paresthesia of skin  Stiffness of right elbow, not elsewhere classified  Pain in right arm  Other symptoms and signs  involving the musculoskeletal system  Rationale for Evaluation and Treatment: Rehabilitation  PERTINENT HISTORY:  She states having Rt elbow pain for about 6 months and Rt hand numbness for about 1-2 months.   She was seeing Dr. Shon Baton for numbness and was sent to Dr. Steward Drone for elbow surgery.   Dr. Shon Baton plans on seeing her back for R CTS  04/07/23.    She states having Rt elbow pain for about 6 months and Rt hand numbness for about 1-2 months.   She was seeing Dr. Shon Baton for numbness and was sent to Dr. Steward Drone for elbow surgery.   Dr. Shon Baton plans on seeing her back for R CTS  04/07/23.   She had a syncopal episode today during her evaluation which limited the evaluation greatly.  More information on this is listed under the "objective" section below. Her husband was present for most of the evaluation.  PRECAUTIONS: Other: anxiety and syncopal episodes ; RED FLAGS: None   WEIGHT BEARING RESTRICTIONS: Yes NWB in Rt arm now and for next 4-6 weeks    SUBJECTIVE:   SUBJECTIVE STATEMENT: She is  4 weeks s/p right lateral epicondyle debridement, repair.  She states being in slightly more pain now, she admits to cooking and cleaning and "pushing herself" to the point of pain.  She states last night she was in  so much pain that she could not sleep.  OT reminds her that she is not to be doing these kind of painful activities this soon from surgery.    PAIN:  Are you having pain?  Yes: NPRS scale: 5-6/10 Pain location: Rt lat elbow Pain description: aching, sharp pinpoints  Aggravating factors: movement, weightbearing Relieving factors: rest, ice   PATIENT GOALS: To improve use of her right dominant arm  NEXT MD VISIT: Approximately 1 month no appointment has been made yet    OBJECTIVE: (All objective assessments below are from initial evaluation on: 03/13/23 unless otherwise specified.)   HAND DOMINANCE: Right   ADLs: Overall ADLs: States decreased ability to grab, hold household  objects, pain and difficulty to open containers, perform FMS tasks (manipulate fasteners on clothing), mild to moderate bathing problems as well.    FUNCTIONAL OUTCOME MEASURES: 03/20/23: Quick DASH 75% impairment today  (Higher % Score  =  More Impairment)     UPPER EXTREMITY ROM    Eval: OT observed that she was able to move her right elbow approximately from 130 degrees of flexion to (-30*) extension.  Detailed measures were unable to be taken as she had a syncopal episode and much time was spent with her recovery today.  Details will be tested as soon as possible and tolerated  Shoulder to Wrist AROM Right 03/20/23 Rt 03/27/23  Shoulder flexion    Shoulder abduction    Shoulder extension    Shoulder internal rotation    Shoulder external rotation    Elbow flexion 126 91  Elbow extension 0 (-5)  Forearm supination 55 50  Forearm pronation  37 59  Wrist flexion 68 33  Wrist extension 50 60  (Blank rows = not tested)   Hand AROM Right 03/20/23  Full Fist Ability (or Gap to Distal Palmar Crease) Full but tight  Thumb Opposition  (Kapandji Scale)  10/10 but tight  (Blank rows = not tested)   UPPER EXTREMITY MMT:     MMT Right TBD  Thumb IP flexion   Shoulder abduction   Shoulder adduction   Shoulder extension   Shoulder internal rotation   Shoulder external rotation   Middle trapezius   Lower trapezius   Elbow flexion   Elbow extension   Forearm supination   Forearm pronation   Wrist flexion   Wrist extension   Wrist ulnar deviation   Wrist radial deviation   (Blank rows = not tested)  HAND FUNCTION: Eval: Observed weakness in affected right hand.  Details will be tested when able and appropriate  COORDINATION: Eval: Observed coordination impairments with affected right hand.  Details will be tested when able and appropriate  SENSATION: 03/20/23:  States some tingling through back of hand and fingers, in radial nerve distribution, mostly.   Eval:  Light  touch intact today, though she states some paresthesia in the right hand especially after waking up from her syncopal episode.   EDEMA:   Eval:  Mildly swollen in right elbow today  COGNITION: Eval: Overall cognitive status: WFL for evaluation today, though very anxious   OBSERVATIONS:   Eval: While OT was discussing range of motion exercises with her, she loses consciousness during therapy momentarily.  She stated feeling a panic attack happening and took some form of pill and then OT stretched her husband from the lobby.  Shortly after she seemed to lose consciousness for about 5 minutes.  She was breathing and her heart was beating and OT was  attempting to take a blood pressure.  He states that she has passed out like this before.  When she regains consciousness her blood pressure was measured at 74/49 mm Hg, and after some rest and food, it went up to 95/74 mmHg.  She does state not eating at all today so far and is almost 4 PM at this time.  She seemed to recover after about 15 minutes, but the evaluation could not be performed thoroughly due to this episode.  They denied need for EMS or to go to the ED, so were recommended to see her PCP about this ASAP-preferably this or next week.    TODAY'S TREATMENT:  03/27/23: She starts with active range of motion for exercise review as well as new measures which shows some increased pain and swelling and stiffness in her right arm likely from her overusing it at home.  She has decreased elbow flexion and she has palpable muscle knots in the biceps and triceps now.  To help combat this, OT does manual therapy IASTM to the biceps, triceps, dorsal forearm to the back of the hand.  She only tolerates this very gently today-states feeling a stretch.  Next, OT gives her reeducation on avoiding repetitive pushing pulling grasping reaching for tasks like cooking cleaning, etc. for at least the next 2 weeks.  OT reminds her that this is a long recovery process and  if she is causing herself pain at home she will have increased swelling and decreased motion like she presents today.  OT does upgrade her home exercise program to include very light stretches today at the forearm and wrist as well as some nerve gliding activities to improve her dynamic motion and hopefully carpal tunnel symptoms.  She tolerates all these well in a very limited motion in her "comfort zone" that is pain-free.  She states understanding that she should withhold painful, heavy repetitive activities several more weeks, but her husband tells me that she is "stubborn."    Exercises - Reach arms upward   - 4 x daily - 3-5 reps - Standing Elbow Flexion Extension AROM  - 4-6 x daily - 3-5 reps - Bend and Pull Back Wrist SLOWLY  - 4 x daily - 5 reps - Tendon Glides  - 4-6 x daily - 3 reps - 2-3 seconds hold - Thumb Opposition  - 4-6 x daily - 10 reps - Forearm Pronation Stretch  - 3-4 x daily - 3-5 reps - 15 sec hold - Wrist Flexion Stretch  - 4 x daily - 3-5 reps - 15 sec hold - Wrist Prayer Stretch  - 4 x daily - 3-5 reps - 15 sec hold - Standing Radial Nerve Glide  - 4-6 x daily - 1 sets - 10-15 reps - Median Nerve Flossing  - 2-3 x daily - 5-10 reps  Patient Education - Scar Massage    PATIENT EDUCATION: Education details: See tx section above for details  Person educated: Patient Education method: Verbal Instruction, Teach back, Handouts  Education comprehension: States and demonstrates understanding, Additional Education required    HOME EXERCISE PROGRAM: Access Code: [email or web address] URL: [email or web address] Date: 03/13/2023 Prepared by: Fannie Knee   GOALS: Goals reviewed with patient? Yes   SHORT TERM GOALS: (STG required if POC>30 days) Target Date: 03/28/23  Pt will obtain protective, custom orthotic. Goal status: TBD/PRN  2.  Pt will demo/state understanding of initial HEP to improve pain levels and prerequisite motion. Goal status:  INITIAL   LONG TERM  GOALS: Target Date: 05/09/23  Pt will improve functional ability by decreased impairment per Quick DASH assessment from TBD to TBD or better, for better quality of life. Goal status: INITIAL baseline will be taken next session as able  2.  Pt will improve grip strength in right dominant hand to at least 40lbs for functional use at home and in IADLs. Goal status: INITIAL baseline will be taken next session as able  3.  Pt will improve A/ROM in right wrist flexion and extension from TBD to at least 65* each, to have functional motion for tasks like reach and grasp.  Goal status: INITIAL baseline will be taken next session as able  4.  Pt will improve strength in left elbow flexion and extension from 3-/5 MMT to at least 4+/5 MMT to have increased functional ability to carry out selfcare and higher-level homecare tasks with less difficulty. Goal status: INITIAL  5.  Pt will improve coordination skills in right dominant arm, as seen by better score on box and blocks testing to have increased functional ability to carry out fine motor tasks (fasteners, etc.) and more complex, coordinated IADLs (meal prep, sports, etc.).  Goal status: INITIAL baseline will be taken next session as able  6.  Pt will decrease pain at rest from 6/10 to 2/10 or better to have better sleep and occupational participation in daily roles. Goal status: INITIAL   ASSESSMENT:  CLINICAL IMPRESSION: 03/27/23: The patient has several negative factors including high anxiety that is even caused her to pass out in therapy, also hypersensitivity near her scar, and also some stubbornness with her daily routines.  She admits to cooking cleaning and doing things that are hurting her and increasing her pain when she was advised to do no pushing pulling weightbearing etc.  OT may need to put her in an elbow immobilization brace if she stays noncompliant.   PLAN:  OT FREQUENCY: 1-2x/week  OT DURATION: 8  weeks through 05/09/2023 and up to 14 visits as needed  PLANNED INTERVENTIONS: 97168 OT Re-evaluation, 97535 self care/ADL training, 16109 therapeutic exercise, 97530 therapeutic activity, 97112 neuromuscular re-education, 97140 manual therapy, 97035 ultrasound, 97010 moist heat, 97010 cryotherapy, 97032 electrical stimulation (manual), 97760 Orthotics management and training, 60454 Splinting (initial encounter), [phone] Subsequent splinting/medication, scar mobilization, compression bandaging, psychosocial skills training, energy conservation, coping strategies training, and patient/family education  CONSULTED AND AGREED WITH PLAN OF CARE: Patient and family member/caregiver  PLAN FOR NEXT SESSION:   Review exercises, new light stretches, no pushing pulling or painful activities.  Perform manual therapy and modalities as helpful and plan for light hand strengthening an additional week if she is doing better pain wise  Fannie Knee, OTR/L, CHT 03/27/2023, 4:34 PM

## 2023-03-27 ENCOUNTER — Encounter: Payer: Self-pay | Admitting: Rehabilitative and Restorative Service Providers"

## 2023-03-27 ENCOUNTER — Ambulatory Visit (INDEPENDENT_AMBULATORY_CARE_PROVIDER_SITE_OTHER): Payer: PRIVATE HEALTH INSURANCE | Admitting: Rehabilitative and Restorative Service Providers"

## 2023-03-27 DIAGNOSIS — R278 Other lack of coordination: Secondary | ICD-10-CM

## 2023-03-27 DIAGNOSIS — M6281 Muscle weakness (generalized): Secondary | ICD-10-CM | POA: Diagnosis not present

## 2023-03-27 DIAGNOSIS — M25521 Pain in right elbow: Secondary | ICD-10-CM | POA: Diagnosis not present

## 2023-03-27 DIAGNOSIS — R202 Paresthesia of skin: Secondary | ICD-10-CM

## 2023-03-27 DIAGNOSIS — M79601 Pain in right arm: Secondary | ICD-10-CM

## 2023-03-27 DIAGNOSIS — R600 Localized edema: Secondary | ICD-10-CM | POA: Diagnosis not present

## 2023-03-27 DIAGNOSIS — R29898 Other symptoms and signs involving the musculoskeletal system: Secondary | ICD-10-CM

## 2023-03-27 DIAGNOSIS — M25621 Stiffness of right elbow, not elsewhere classified: Secondary | ICD-10-CM

## 2023-03-31 NOTE — Therapy (Signed)
OUTPATIENT OCCUPATIONAL THERAPY TREATMENT NOTE  Patient Name: Carrie Gordon MRN: [id number] DOB:[birth year], 52 y.o., female Today's Date: 04/03/2023  PCP: Lilyan Punt, MD REFERRING PROVIDER:  Huel Cote, MD    END OF SESSION:  OT End of Session - 04/03/23 1527     Visit Number 4    Number of Visits 14    Date for OT Re-Evaluation 05/09/23    Authorization Type "Generic"    OT Start Time 1527    OT Stop Time 1615    OT Time Calculation (min) 48 min    Activity Tolerance Patient tolerated treatment well;Patient limited by fatigue;Patient limited by pain;No increased pain    Behavior During Therapy [facility] for tasks assessed/performed                Past Medical History:  Diagnosis Date   Anxiety    Back problem    Bursitis of shoulder, right    Depression    Elevated hemoglobin A1c 2017   Hypertension    Low vitamin D level    Migraines    Uterine fibroid    Past Surgical History:  Procedure Laterality Date   ABDOMINAL HYSTERECTOMY  2010   TAH--Dr. Edward Jolly   MYOMECTOMY  2006   -Dr. Jamelle Haring ELBOW RELEASE/NIRSCHEL PROCEDURE Right 02/24/2023   Procedure: Right elbow lateral epicondyle debridement and repair;  Surgeon: Huel Cote, MD;  Location: [facility] ORS;  Service: Orthopedics;  Laterality: Right;   TUBAL LIGATION  2002   -Dr. Edward Jolly   Patient Active Problem List   Diagnosis Date Noted   Lateral epicondylitis, right elbow 02/24/2023   Primary hypertension 07/02/2021   Near syncope 12/31/2019   Other fatigue 12/31/2019   Anxiety 08/26/2017   Insomnia 12/19/2016   Migraines 12/06/2014    ONSET DATE: DOS 02/24/23  REFERRING DIAG: G56.01 (ICD-10-CM) - Carpal tunnel syndrome, right upper limb   THERAPY DIAG:  Localized edema  Other lack of coordination  Pain in right elbow  Paresthesia of skin  Pain in right arm  Stiffness of right elbow, not elsewhere classified  Muscle weakness (generalized)  Other symptoms and signs  involving the musculoskeletal system  Rationale for Evaluation and Treatment: Rehabilitation  PERTINENT HISTORY:  She states having Rt elbow pain for about 6 months and Rt hand numbness for about 1-2 months.   She was seeing Dr. Shon Baton for numbness and was sent to Dr. Steward Drone for elbow surgery.   Dr. Shon Baton plans on seeing her back for R CTS  04/07/23.    She states having Rt elbow pain for about 6 months and Rt hand numbness for about 1-2 months.   She was seeing Dr. Shon Baton for numbness and was sent to Dr. Steward Drone for elbow surgery.   Dr. Shon Baton plans on seeing her back for R CTS  04/07/23.   She had a syncopal episode today during her evaluation which limited the evaluation greatly.  More information on this is listed under the "objective" section below. Her husband was present for most of the evaluation.  PRECAUTIONS: Other: anxiety and syncopal episodes ; RED FLAGS: None   WEIGHT BEARING RESTRICTIONS: Yes NWB in Rt arm now and for next 4-6 weeks    SUBJECTIVE:   SUBJECTIVE STATEMENT: She is 5+ weeks s/p right lateral epicondyle debridement, repair.  She states having significantly less pain now, but still having some intermittent pain at home and numbness in her hand after resting for a while with her arm propped up  on her pillow at her recliner.  OT helps her problem solve this pain and numbness.     PAIN:  Are you having pain?  Yes: NPRS scale: 1-2/10 Pain location: Rt lat elbow Pain description: aching, sharp pinpoints  Aggravating factors: movement, weightbearing Relieving factors: rest, ice   PATIENT GOALS: To improve use of her right dominant arm  NEXT MD VISIT: Approximately 1 month no appointment has been made yet    OBJECTIVE: (All objective assessments below are from initial evaluation on: 03/13/23 unless otherwise specified.)   HAND DOMINANCE: Right   ADLs: Overall ADLs: States decreased ability to grab, hold household objects, pain and difficulty to open  containers, perform FMS tasks (manipulate fasteners on clothing), mild to moderate bathing problems as well.    FUNCTIONAL OUTCOME MEASURES: 03/20/23: Quick DASH 75% impairment today  (Higher % Score  =  More Impairment)     UPPER EXTREMITY ROM    Eval: OT observed that she was able to move her right elbow approximately from 130 degrees of flexion to (-30*) extension.  Detailed measures were unable to be taken as she had a syncopal episode and much time was spent with her recovery today.  Details will be tested as soon as possible and tolerated  Shoulder to Wrist AROM Right 03/20/23 Rt 03/27/23 Rt 04/03/23  Shoulder flexion     Shoulder abduction     Shoulder extension     Shoulder internal rotation     Shoulder external rotation     Elbow flexion 126 91 104  Elbow extension 0 (-5) (-30)  Forearm supination 55 50 51  Forearm pronation  37 59 50  Wrist flexion 68 33 52  Wrist extension 50 60 28  (Blank rows = not tested)   Hand AROM Right 03/20/23  Full Fist Ability (or Gap to Distal Palmar Crease) Full but tight  Thumb Opposition  (Kapandji Scale)  10/10 but tight  (Blank rows = not tested)   UPPER EXTREMITY MMT:     MMT Right TBD  Thumb IP flexion   Shoulder abduction   Shoulder adduction   Shoulder extension   Shoulder internal rotation   Shoulder external rotation   Middle trapezius   Lower trapezius   Elbow flexion   Elbow extension   Forearm supination   Forearm pronation   Wrist flexion   Wrist extension   Wrist ulnar deviation   Wrist radial deviation   (Blank rows = not tested)  HAND FUNCTION: Eval: Observed weakness in affected right hand.  Details will be tested when able and appropriate  COORDINATION: Eval: Observed coordination impairments with affected right hand.  Details will be tested when able and appropriate  SENSATION: 03/20/23:  States some tingling through back of hand and fingers, in radial nerve distribution, mostly.   Eval:  Light  touch intact today, though she states some paresthesia in the right hand especially after waking up from her syncopal episode.   EDEMA:   Eval:  Mildly swollen in right elbow today  COGNITION: Eval: Overall cognitive status: WFL for evaluation today, though very anxious   OBSERVATIONS:   Eval: While OT was discussing range of motion exercises with her, she loses consciousness during therapy momentarily.  She stated feeling a panic attack happening and took some form of pill and then OT stretched her husband from the lobby.  Shortly after she seemed to lose consciousness for about 5 minutes.  She was breathing and her heart was beating and  OT was attempting to take a blood pressure.  He states that she has passed out like this before.  When she regains consciousness her blood pressure was measured at 74/49 mm Hg, and after some rest and food, it went up to 95/74 mmHg.  She does state not eating at all today so far and is almost 4 PM at this time.  She seemed to recover after about 15 minutes, but the evaluation could not be performed thoroughly due to this episode.  They denied need for EMS or to go to the ED, so were recommended to see her PCP about this ASAP-preferably this or next week.    TODAY'S TREATMENT:  04/03/23: She performs active range of motion for exercise as well as new measures which show some mixed progress.  She is some better elbow flexion and decreased pain but worse elbow extension today and better wrist flexion but worst wrist extension today.  OT feels this is highly linked towards anxiety and guarding behaviors, as throughout the session she tends to demonstrate better range of motion when not so focused on the task.  OT reviews her full home exercise program with her, but first does manual therapy IASTM for the biceps, triceps, dorsal forearm and dorsal wrist and hand.  She states this is somewhat tender but also relieving.  Next, does manual therapy percussion tool to the biceps  and triceps from which she gets a bit more relief.  OT then does some manual therapy stretches with her while concurrently reviewing these new stretches and her home exercise program.  We work on forearm rotations, wrist motion in flexion and extension, as well as reviewing neuromuscular reeducation exercises for radial nerve irritation as well as median nerve issues and numbness.  Again OT provides self-care recommendations to not sit for long periods of time with her arm "propped up."  As she admits that these things make her increase her pain and numbness.  She is told to be moving every hour and 1/2 to 2 hours and watch her shoulder postures-she should have retracted shoulder is not protracted shoulder, then elbow, resting on carpal tunnel.  That is a bad combination that is causing numbness.  She states understanding and states feeling better after her session today and leaves with no significant pain.    Exercises - Reach arms upward   - 4 x daily - 3-5 reps - Standing Elbow Flexion Extension AROM  - 4-6 x daily - 3-5 reps - Bend and Pull Back Wrist SLOWLY  - 4 x daily - 5 reps - Forearm Pronation Stretch  - 3-4 x daily - 3-5 reps - 15 sec hold - Wrist Flexion Stretch  - 4 x daily - 3-5 reps - 15 sec hold - Wrist Prayer Stretch  - 4 x daily - 3-5 reps - 15 sec hold - Standing Radial Nerve Glide  - 4-6 x daily - 1 sets - 10-15 reps - Median Nerve Flossing  - 2-3 x daily - 5-10 reps Patient Education - Scar Massage    PATIENT EDUCATION: Education details: See tx section above for details  Person educated: Patient Education method: Verbal Instruction, Teach back, Handouts  Education comprehension: States and demonstrates understanding, Additional Education required    HOME EXERCISE PROGRAM: Access Code: [email or web address] URL: [email or web address] Date: 03/13/2023 Prepared by: Fannie Knee   GOALS: Goals reviewed with patient? Yes   SHORT TERM GOALS: (STG required if  POC>30 days) Target Date: 03/28/23  Pt will obtain protective,  custom orthotic. Goal status: TBD/PRN  2.  Pt will demo/state understanding of initial HEP to improve pain levels and prerequisite motion. Goal status: 04/03/23: MET   LONG TERM GOALS: Target Date: 05/09/23  Pt will improve functional ability by decreased impairment per Quick DASH assessment from TBD to TBD or better, for better quality of life. Goal status: INITIAL baseline will be taken next session as able  2.  Pt will improve grip strength in right dominant hand to at least 40lbs for functional use at home and in IADLs. Goal status: INITIAL baseline will be taken next session as able  3.  Pt will improve A/ROM in right wrist flexion and extension from TBD to at least 65* each, to have functional motion for tasks like reach and grasp.  Goal status: INITIAL baseline will be taken next session as able  4.  Pt will improve strength in left elbow flexion and extension from 3-/5 MMT to at least 4+/5 MMT to have increased functional ability to carry out selfcare and higher-level homecare tasks with less difficulty. Goal status: INITIAL  5.  Pt will improve coordination skills in right dominant arm, as seen by better score on box and blocks testing to have increased functional ability to carry out fine motor tasks (fasteners, etc.) and more complex, coordinated IADLs (meal prep, sports, etc.).  Goal status: INITIAL baseline will be taken next session as able  6.  Pt will decrease pain at rest from 6/10 to 2/10 or better to have better sleep and occupational participation in daily roles. Goal status: INITIAL   ASSESSMENT:  CLINICAL IMPRESSION: 04/03/23: Initially she was very painful, and then when pain decreased she got too aggressive with home activities which increased her pain and increased her swelling and stiffness.  Now, her pain is down and her motion is somewhat improved though still a bit stiff and guarded from  pain.  She has been sitting too long with her arm propped up on a pillow and needs to move more often but keep the intensity below.  She will see the doctor next Wednesday.  PLAN:  OT FREQUENCY: 1-2x/week  OT DURATION: 8 weeks through 05/09/2023 and up to 14 visits as needed  PLANNED INTERVENTIONS: 97168 OT Re-evaluation, 97535 self care/ADL training, 16109 therapeutic exercise, 97530 therapeutic activity, 97112 neuromuscular re-education, 97140 manual therapy, 97035 ultrasound, 97010 moist heat, 97010 cryotherapy, 97032 electrical stimulation (manual), 97760 Orthotics management and training, 60454 Splinting (initial encounter), [phone] Subsequent splinting/medication, scar mobilization, compression bandaging, psychosocial skills training, energy conservation, coping strategies training, and patient/family education  CONSULTED AND AGREED WITH PLAN OF CARE: Patient and family member/caregiver  PLAN FOR NEXT SESSION:   Next week at 6 weeks postop, continue with manual therapy and modalities as helpful, continued light functional motion and avoid painful and heavier activities for another 1-2 additional weeks.  Fannie Knee, OTR/L, CHT 04/03/2023, 4:34 PM

## 2023-04-03 ENCOUNTER — Encounter: Payer: Self-pay | Admitting: Rehabilitative and Restorative Service Providers"

## 2023-04-03 ENCOUNTER — Ambulatory Visit (INDEPENDENT_AMBULATORY_CARE_PROVIDER_SITE_OTHER): Payer: PRIVATE HEALTH INSURANCE | Admitting: Rehabilitative and Restorative Service Providers"

## 2023-04-03 DIAGNOSIS — R202 Paresthesia of skin: Secondary | ICD-10-CM | POA: Diagnosis not present

## 2023-04-03 DIAGNOSIS — R29898 Other symptoms and signs involving the musculoskeletal system: Secondary | ICD-10-CM

## 2023-04-03 DIAGNOSIS — M79601 Pain in right arm: Secondary | ICD-10-CM

## 2023-04-03 DIAGNOSIS — M6281 Muscle weakness (generalized): Secondary | ICD-10-CM

## 2023-04-03 DIAGNOSIS — R600 Localized edema: Secondary | ICD-10-CM

## 2023-04-03 DIAGNOSIS — M25521 Pain in right elbow: Secondary | ICD-10-CM | POA: Diagnosis not present

## 2023-04-03 DIAGNOSIS — R278 Other lack of coordination: Secondary | ICD-10-CM | POA: Diagnosis not present

## 2023-04-03 DIAGNOSIS — M25621 Stiffness of right elbow, not elsewhere classified: Secondary | ICD-10-CM

## 2023-04-04 ENCOUNTER — Telehealth: Payer: Self-pay | Admitting: Sports Medicine

## 2023-04-04 NOTE — Telephone Encounter (Signed)
Called patient and confirmed that her upcoming appointment is for follow up of carpal tunnel symptoms, whereas appointment with Dr. Steward Drone is another post-op appointment. She plans to keep the Monday appointment.

## 2023-04-04 NOTE — Telephone Encounter (Signed)
Patient called asked if she still needed the appointment for Monday 04/07/2023?  Patient said she is seeing Dr. Steward Drone 04/09/2023. The number to contact patient is [phone]

## 2023-04-07 ENCOUNTER — Encounter: Payer: No Typology Code available for payment source | Admitting: Sports Medicine

## 2023-04-07 ENCOUNTER — Ambulatory Visit (INDEPENDENT_AMBULATORY_CARE_PROVIDER_SITE_OTHER): Payer: No Typology Code available for payment source | Admitting: Sports Medicine

## 2023-04-07 ENCOUNTER — Encounter: Payer: Self-pay | Admitting: Sports Medicine

## 2023-04-07 ENCOUNTER — Other Ambulatory Visit: Payer: Self-pay

## 2023-04-07 DIAGNOSIS — G5601 Carpal tunnel syndrome, right upper limb: Secondary | ICD-10-CM

## 2023-04-07 NOTE — Progress Notes (Signed)
Carrie Gordon - 52 y.o. female MRN [id number]  Date of birth: 07-09-1970  Office Visit Note: Visit Date: 04/07/2023 PCP: Babs Sciara, MD Referred by: Babs Sciara, MD  Subjective: Chief Complaint  Patient presents with   Right Hand - Numbness   HPI: Carrie Gordon is a pleasant 52 y.o. female who presents today for right hand/wrist numbness and tingling.  Carrie Gordon has had numbness and tingling into the right hand and fingers for almost the last 3 months.  She is status post right lateral epicondyle debridement surgery on 02/24/2023.  She is undergoing occupational therapy for this.  She has been wearing her neutral wrist brace during the day as well as at night but she still continues with numbness and tingling more so in digits 2-4.  She will have to shake out the hands.  She is not taking any medication for this.  Pertinent ROS were reviewed with the patient and found to be negative unless otherwise specified above in HPI.   Assessment & Plan: Visit Diagnoses:  1. Carpal tunnel syndrome, right upper limb    Plan: Impression is chronic and progressive carpal tunnel syndromes of the right upper extremity.  She is undergoing OT/PT for her lateral epicondylitis debridement but still having persisting carpal tunnel symptoms.  She prefers not to take oral medication for this.  She will continue with her neutral wrist bracing at nighttime. Through shared decision-making, did proceed with ultrasound-guided carpal tunnel injection with median nerve hydrodissection.  Discussed postinjection protocol for the next 48 to 72 hours, then may return to hand therapy.  If she is not receiving good relief or lasting relief for this after 1 month, she may reevaluate and we may consider EMG/nerve conduction study or more discussion on CTS release. If she is doing well, may follow-up as needed.   Follow-up: Return for will call or return if symptoms do not resolve.   Meds &  Orders: No orders of the defined types were placed in this encounter.   Orders Placed This Encounter  Procedures   [place] Extrem Up Right Ltd     Procedures: Procedure: US-guided Carpal Tunnel Injection, Right Wrist After informed verbal consent and discussion on R/B/I, a timeout was performed, patient was seated on exam table with the affected arm placed in supine position on table. The area overlying the patient's carpal tunnel was prepped with Betadine and alcohol swabs then utilizing ultrasound guidance via an in-plane approach, the patient's carpal tunnel was injected with a mixture of 2:2:1:1 lidocaine:D5W:D5W:betamethasone with hydrodissection of the median nerve from the flexor retinaculum in 360 degree fashion. Visualization of the needle was achieved with a transverse, in-plane approach. Patient tolerated procedure well without immediate complications.        Clinical History: No specialty comments available.  She reports that she has never smoked. She has never used smokeless tobacco.  Recent Labs    03/24/23 1631  HGBA1C 6.4*    Objective:   Vital Signs: LMP  (LMP Unknown)   Physical Exam  Gen: Well-appearing, in no acute distress; non-toxic CV:  Well-perfused. Warm.  Resp: Breathing unlabored on room air; no wheezing. Psych: Fluid speech in conversation; appropriate affect; normal thought process Neuro: Sensation intact throughout. No gross coordination deficits.   Ortho Exam - Right hand/wrist: No redness swelling or effusion.  Positive Tinel's at the proximal carpal tunnel.  Positive Durkan's, positive Phalen's test.  Cap refill less than 2 seconds.  Normal strength with finger abduction  and abduction.  Okay sign intact.  Imaging: [place] Extrem Up Right Ltd  Result Date: 04/07/2023 Limited musculoskeletal ultrasound of the right volar wrist and carpal tunnel was performed today.  Evaluation shows a median nerve which is biphasic in nature with a persistent median  artery.  The larger fascicle of the median nerve is approximately 0.874 cm in circumference.  Just distal to this area of the nerve does appear to flatten, indicative of impingement/carpal tunnel. *Technically successful ultrasound-guided carpal tunnel injection with median nerve hydrodissection     Past Medical/Family/Surgical/Social History: Medications & Allergies reviewed per EMR, new medications updated. Patient Active Problem List   Diagnosis Date Noted   Lateral epicondylitis, right elbow 02/24/2023   Primary hypertension 07/02/2021   Near syncope 12/31/2019   Other fatigue 12/31/2019   Anxiety 08/26/2017   Insomnia 12/19/2016   Migraines 12/06/2014   Past Medical History:  Diagnosis Date   Anxiety    Back problem    Bursitis of shoulder, right    Depression    Elevated hemoglobin A1c 2017   Hypertension    Low vitamin D level    Migraines    Uterine fibroid    Family History  Adopted: Yes  Problem Relation Age of Onset   Breast cancer Neg Hx    Past Surgical History:  Procedure Laterality Date   ABDOMINAL HYSTERECTOMY  2010   TAH--Dr. Edward Jolly   MYOMECTOMY  2006   -Dr. Jamelle Haring ELBOW RELEASE/NIRSCHEL PROCEDURE Right 02/24/2023   Procedure: Right elbow lateral epicondyle debridement and repair;  Surgeon: Huel Cote, MD;  Location: [facility] ORS;  Service: Orthopedics;  Laterality: Right;   TUBAL LIGATION  2002   -Dr. Edward Jolly   Social History   Occupational History   Not on file  Tobacco Use   Smoking status: Never   Smokeless tobacco: Never  Vaping Use   Vaping status: Never Used  Substance and Sexual Activity   Alcohol use: Yes    Alcohol/week: 1.0 standard drink of alcohol    Types: 1 Glasses of wine per week    Comment: occ   Drug use: No   Sexual activity: Yes    Partners: Male    Birth control/protection: Surgical    Comment: TAH

## 2023-04-07 NOTE — Progress Notes (Signed)
Patient says that she is still having numbness and tingling in the right hand. She says that it is in the inside of her palm and the dorsal side of her second - fifth fingers. She says that moving the hand and wiggling the fingers helps to alleviate these symptoms, but anytime her hand is at rest it starts again; position of the hand does not make a difference.

## 2023-04-08 ENCOUNTER — Encounter: Payer: PRIVATE HEALTH INSURANCE | Admitting: Rehabilitative and Restorative Service Providers"

## 2023-04-09 ENCOUNTER — Ambulatory Visit (INDEPENDENT_AMBULATORY_CARE_PROVIDER_SITE_OTHER): Payer: PRIVATE HEALTH INSURANCE | Admitting: Orthopaedic Surgery

## 2023-04-09 DIAGNOSIS — M7711 Lateral epicondylitis, right elbow: Secondary | ICD-10-CM

## 2023-04-09 MED ORDER — TRIAMCINOLONE ACETONIDE 40 MG/ML IJ SUSP
80.0000 mg | INTRAMUSCULAR | Status: AC | PRN
Start: 2023-04-09 — End: 2023-04-09
  Administered 2023-04-09: 80 mg via INTRA_ARTICULAR

## 2023-04-09 MED ORDER — LIDOCAINE HCL 1 % IJ SOLN
4.0000 mL | INTRAMUSCULAR | Status: AC | PRN
Start: 2023-04-09 — End: 2023-04-09
  Administered 2023-04-09: 4 mL

## 2023-04-09 NOTE — Progress Notes (Signed)
Post Operative Evaluation    Procedure/Date of Surgery: Right lateral epicondyle debridement and repair 10/14  Interval History:    Presents today 6 weeks status post the above procedure.  Overall she is doing very well.  Having some soreness about the posterior elbow joint recently more consistent with synovitis.  She has been working with occupational therapy which is going well   PMH/PSH/Family History/Social History/Meds/Allergies:    Past Medical History:  Diagnosis Date  . Anxiety   . Back problem   . Bursitis of shoulder, right   . Depression   . Elevated hemoglobin A1c 2017  . Hypertension   . Low vitamin D level   . Migraines   . Uterine fibroid    Past Surgical History:  Procedure Laterality Date  . ABDOMINAL HYSTERECTOMY  2010   TAH--Dr. Edward Jolly  . MYOMECTOMY  2006   -Dr. Edward Jolly  . TENNIS ELBOW RELEASE/NIRSCHEL PROCEDURE Right 02/24/2023   Procedure: Right elbow lateral epicondyle debridement and repair;  Surgeon: Huel Cote, MD;  Location: [facility] ORS;  Service: Orthopedics;  Laterality: Right;  . TUBAL LIGATION  2002   -Dr. Edward Jolly   Social History   Socioeconomic History  . Marital status: Married    Spouse name: Not on file  . Number of children: 1  . Years of education: Not on file  . Highest education level: Not on file  Occupational History  . Not on file  Tobacco Use  . Smoking status: Never  . Smokeless tobacco: Never  Vaping Use  . Vaping status: Never Used  Substance and Sexual Activity  . Alcohol use: Yes    Alcohol/week: 1.0 standard drink of alcohol    Types: 1 Glasses of wine per week    Comment: occ  . Drug use: No  . Sexual activity: Yes    Partners: Male    Birth control/protection: Surgical    Comment: TAH  Other Topics Concern  . Not on file  Social History Narrative  . Not on file   Social Determinants of Health   Financial Resource Strain: Not on file  Food Insecurity: Not on file   Transportation Needs: Not on file  Physical Activity: Not on file  Stress: Not on file  Social Connections: Not on file   Family History  Adopted: Yes  Problem Relation Age of Onset  . Breast cancer Neg Hx    No Known Allergies Current Outpatient Medications  Medication Sig Dispense Refill  . ALPRAZolam (XANAX) 1 MG tablet TAKE 1/2 TABLET TO 1 TABLET TWICE DAILY AS NEEDED USE SPARINGLY FOR ANXIETY 45 tablet 2  . amLODipine (NORVASC) 5 MG tablet Take 1 tablet (5 mg total) by mouth daily. 90 tablet 1  . KLOR-CON M10 10 MEQ tablet TAKE 1 TABLET (10 MEQ TOTAL) BY MOUTH DAILY FOR 15 DAYS. 90 tablet 1  . ondansetron (ZOFRAN) 8 MG tablet TAKE 1 TABLET BY MOUTH EVERY 8 HOURS AS NEEDED FOR NAUSEA 15 tablet 0   Current Facility-Administered Medications  Medication Dose Route Frequency Provider Last Rate Last Admin  . 0.9 %  sodium chloride infusion  500 mL Intravenous Once Sherrilyn Rist, MD       No results found.  Review of Systems:   A ROS was performed including pertinent positives and negatives as documented  in the HPI.   Musculoskeletal Exam:    There were no vitals taken for this visit.  Right lateral epicondylar incision is well-appearing without erythema or drainage.  Fires extensors of all of her fingers as well as the extensor of the thumb as well as flexors.  Sensation intact in all distributions of the right hand  Imaging:      I personally reviewed and interpreted the radiographs.   Assessment:   6-week status post right lateral epicondyle debridement repair doing very well.  She does have some synovitis about the elbow joint itself and for that I have recommended ultrasound-guided injection into the joint.  Will plan to proceed with this today.  I will plan to see her back in 4 weeks for reassessment  Plan :    -Return to clinic 4 weeks for reassessment     Procedure Note  Patient: Carrie Gordon             Date of Birth: 04/11/1971            MRN: [id number]             Visit Date: 04/09/2023  Procedures: Visit Diagnoses: No diagnosis found.  Medium Joint Inj on 04/09/2023 5:38 PM Indications: pain Details: 22 G 1.5 in needle, ultrasound-guided lateral approach Medications: 4 mL lidocaine 1 %; 80 mg triamcinolone acetonide 40 MG/ML Outcome: tolerated well, no immediate complications Consent was given by the patient. Immediately prior to procedure a time out was called to verify the correct patient, procedure, equipment, support staff and site/side marked as required. Patient was prepped and draped in the usual sterile fashion.    Medium Joint Inj: R elbow on 04/09/2023 5:38 PM Indications: pain Details: 22 G 1.5 in needle, ultrasound-guided lateral approach Medications: 4 mL lidocaine 1 % Outcome: tolerated well, no immediate complications Consent was given by the patient. Immediately prior to procedure a time out was called to verify the correct patient, procedure, equipment, support staff and site/side marked as required. Patient was prepped and draped in the usual sterile fashion.        I personally saw and evaluated the patient, and participated in the management and treatment plan.  Huel Cote, MD Attending Physician, Orthopedic Surgery  This document was dictated using Dragon voice recognition software. A reasonable attempt at proof reading has been made to minimize errors.

## 2023-04-14 ENCOUNTER — Telehealth: Payer: Self-pay | Admitting: Orthopaedic Surgery

## 2023-04-14 NOTE — Telephone Encounter (Signed)
[employer] forms received. To [employer]. 

## 2023-04-16 NOTE — Therapy (Signed)
OUTPATIENT OCCUPATIONAL THERAPY TREATMENT NOTE  Patient Name: Carrie Gordon MRN: [id number] DOB:Feb 02, 1971, 52 y.o., female Today's Date: 04/17/2023  PCP: Lilyan Punt, MD REFERRING PROVIDER:  Huel Cote, MD    END OF SESSION:  OT End of Session - 04/17/23 1514     Visit Number 5    Number of Visits 14    Date for OT Re-Evaluation 05/09/23    Authorization Type "Generic"    OT Start Time 1514    OT Stop Time 1559    OT Time Calculation (min) 45 min    Equipment Utilized During Treatment pink putty    Activity Tolerance Patient tolerated treatment well;Patient limited by fatigue;Patient limited by pain;No increased pain    Behavior During Therapy [facility] for tasks assessed/performed              Past Medical History:  Diagnosis Date   Anxiety    Back problem    Bursitis of shoulder, right    Depression    Elevated hemoglobin A1c 2017   Hypertension    Low vitamin D level    Migraines    Uterine fibroid    Past Surgical History:  Procedure Laterality Date   ABDOMINAL HYSTERECTOMY  2010   TAH--Dr. Edward Jolly   MYOMECTOMY  2006   -Dr. Jamelle Haring ELBOW RELEASE/NIRSCHEL PROCEDURE Right 02/24/2023   Procedure: Right elbow lateral epicondyle debridement and repair;  Surgeon: Huel Cote, MD;  Location: [facility] ORS;  Service: Orthopedics;  Laterality: Right;   TUBAL LIGATION  2002   -Dr. Edward Jolly   Patient Active Problem List   Diagnosis Date Noted   Lateral epicondylitis, right elbow 02/24/2023   Primary hypertension 07/02/2021   Near syncope 12/31/2019   Other fatigue 12/31/2019   Anxiety 08/26/2017   Insomnia 12/19/2016   Migraines 12/06/2014    ONSET DATE: DOS 02/24/23  REFERRING DIAG: G56.01 (ICD-10-CM) - Carpal tunnel syndrome, right upper limb   THERAPY DIAG:  Localized edema  Other lack of coordination  Pain in right elbow  Paresthesia of skin  Pain in right arm  Muscle weakness (generalized)  Stiffness of right elbow, not  elsewhere classified  Rationale for Evaluation and Treatment: Rehabilitation  PERTINENT HISTORY:  She states having Rt elbow pain for about 6 months and Rt hand numbness for about 1-2 months.   She was seeing Dr. Shon Baton for numbness and was sent to Dr. Steward Drone for elbow surgery.   Dr. Shon Baton plans on seeing her back for R CTS  04/07/23.    She states having Rt elbow pain for about 6 months and Rt hand numbness for about 1-2 months.   She was seeing Dr. Shon Baton for numbness and was sent to Dr. Steward Drone for elbow surgery.   Dr. Shon Baton plans on seeing her back for R CTS  04/07/23.   She had a syncopal episode today during her evaluation which limited the evaluation greatly.  More information on this is listed under the "objective" section below. Her husband was present for most of the evaluation.  PRECAUTIONS: Other: anxiety and syncopal episodes ; RED FLAGS: None   WEIGHT BEARING RESTRICTIONS: Yes NWB in Rt arm now and for next 4-6 weeks    SUBJECTIVE:   SUBJECTIVE STATEMENT: She is 7 weeks s/p right lateral epicondyle debridement, repair.  She missed last week's appointment, and today she states having a carpal tunnel injection on 04/07/2023 with Dr. Shon Baton.  Also had an ultrasound-guided injection to the elbow on 04/09/2023 with Dr.  Bokshan. She says her elbow pain is better after injection and her numbness in her fingers is gone, but she has a "sharp spike" in her carpal tunnel occasionally with only certain wrist motions since that injection.  OT feels that this is from a tight flexor carpi radialis tendon and wearing her wrist brace frequently.    PAIN:  Are you having pain?  Yes: NPRS scale: 3/10 in elbow and wrist has a "pin prick" feeling at times since injection.  Pain location: Rt lat elbow Pain description: aching, sharp pinpoints  Aggravating factors: movement, weightbearing Relieving factors: rest, ice   PATIENT GOALS: To improve use of her right dominant arm  NEXT MD VISIT:  Approximately 1 month no appointment has been made yet    OBJECTIVE: (All objective assessments below are from initial evaluation on: 03/13/23 unless otherwise specified.)   HAND DOMINANCE: Right   ADLs: Overall ADLs: States decreased ability to grab, hold household objects, pain and difficulty to open containers, perform FMS tasks (manipulate fasteners on clothing), mild to moderate bathing problems as well.    FUNCTIONAL OUTCOME MEASURES: 03/20/23: Quick DASH 75% impairment today  (Higher % Score  =  More Impairment)     UPPER EXTREMITY ROM    Eval: OT observed that she was able to move her right elbow approximately from 130 degrees of flexion to (-30*) extension.  Detailed measures were unable to be taken as she had a syncopal episode and much time was spent with her recovery today.  Details will be tested as soon as possible and tolerated  Shoulder to Wrist AROM Right 03/20/23 Rt 03/27/23 Rt 04/03/23 Rt 04/17/23  Shoulder flexion      Shoulder abduction      Shoulder extension      Shoulder internal rotation      Shoulder external rotation      Elbow flexion 126 91 104 116  Elbow extension 0 (-5) (-30) (-20)  Forearm supination 55 50 51   Forearm pronation  37 59 50   Wrist flexion 68 33 52   Wrist extension 50 60 28   (Blank rows = not tested)   Hand AROM Right 03/20/23  Full Fist Ability (or Gap to Distal Palmar Crease) Full but tight  Thumb Opposition  (Kapandji Scale)  10/10 but tight  (Blank rows = not tested)   UPPER EXTREMITY MMT:     MMT Right TBD  Thumb IP flexion   Shoulder abduction   Shoulder adduction   Shoulder extension   Shoulder internal rotation   Shoulder external rotation   Middle trapezius   Lower trapezius   Elbow flexion   Elbow extension   Forearm supination   Forearm pronation   Wrist flexion   Wrist extension   Wrist ulnar deviation   Wrist radial deviation   (Blank rows = not tested)  HAND FUNCTION: 04/17/23: grip strength  Rt: 27#, Lt: 55#  Eval: Observed weakness in affected right hand.  Details will be tested when able and appropriate  COORDINATION: 04/17/23: Box and Blocks Test: Rt 35 Blocks today (57 is WFL)   Eval: Observed coordination impairments with affected right hand.  Details will be tested when able and appropriate  SENSATION: 03/20/23:  States some tingling through back of hand and fingers, in radial nerve distribution, mostly.   Eval:  Light touch intact today, though she states some paresthesia in the right hand especially after waking up from her syncopal episode.   EDEMA:  Eval:  Mildly swollen in right elbow today  COGNITION: Eval: Overall cognitive status: WFL for evaluation today, though very anxious   OBSERVATIONS:   Eval: While OT was discussing range of motion exercises with her, she loses consciousness during therapy momentarily.  She stated feeling a panic attack happening and took some form of pill and then OT stretched her husband from the lobby.  Shortly after she seemed to lose consciousness for about 5 minutes.  She was breathing and her heart was beating and OT was attempting to take a blood pressure.  He states that she has passed out like this before.  When she regains consciousness her blood pressure was measured at 74/49 mm Hg, and after some rest and food, it went up to 95/74 mmHg.  She does state not eating at all today so far and is almost 4 PM at this time.  She seemed to recover after about 15 minutes, but the evaluation could not be performed thoroughly due to this episode.  They denied need for EMS or to go to the ED, so were recommended to see her PCP about this ASAP-preferably this or next week.    TODAY'S TREATMENT:  04/17/23: She starts with active range of motion at the elbow which shows improvement in both flexion and extension but still tentative with motion and some anxiety.  OT puts moist heat around her elbow for 5 minutes while performing manual therapy IASTM  to the volar part of her forearm and wrist where she has been feeling these "sharp spikes" of pain occasionally.  OT palpates tight flexor carpi radialis tendon and tight muscle belly and works these a bit with the tools, before offering the patient trigger point dry needling modality.  She states having this done before and consents to doing this treatment again today.  OT uses a 0.30 x 50mm inserted only partly into the flexor carpi radialis muscle belly and also the common flexor origin muscle belly achieving small twitch responses in both areas and minimal bleeding, no bruising, no lingering pain.  She states feeling a bit looser in the forearm after this treatment is done.  The FCR tendon and muscle belly feel less prominent afterwards to palpate.  Next, OT reviews her stretch program with her performing some with her and having her perform them back.  Additionally, OT reviews nerve gliding for both median and radial nerves to help both carpal tunnel symptoms and elbow symptoms.  She tolerates this fairly well and was encouraged not to cause pain to herself when performing these.  Generally she was encouraged to be moving much more often and also now weaning from her wrist brace throughout the day for light activities or no weight activities or simply just when resting.  She performs the box and blocks Test for functional activity and to get a baseline measure also to encourage light functional motion.  Of course she does this without her wrist brace on today.  She also tolerates light grip training today done isometrically and is provided pink therapy putty to work on this at home.  She should only do this 2-3 times a day, nonpainfully, isometrically, feeling an active stretch through the forearm and elbow.  She did this today multiple times feeling no significant pain or tension through the arm.  She thanks the therapist and leaves stating feeling no significant pain or problems or tingling or numbness  at the end of the session.   PATIENT EDUCATION: Education details: See tx  section above for details  Person educated: Patient Education method: Verbal Instruction, Teach back, Handouts  Education comprehension: States and demonstrates understanding, Additional Education required    HOME EXERCISE PROGRAM: Access Code: [email or web address] URL: [email or web address] Date: 03/13/2023 Prepared by: Fannie Knee   GOALS: Goals reviewed with patient? Yes   SHORT TERM GOALS: (STG required if POC>30 days) Target Date: 03/28/23  Pt will obtain protective, custom orthotic. Goal status: TBD/PRN  2.  Pt will demo/state understanding of initial HEP to improve pain levels and prerequisite motion. Goal status: 04/03/23: MET   LONG TERM GOALS: Target Date: 05/09/23  Pt will improve functional ability by decreased impairment per Quick DASH assessment from 75% to 25% or better, for better quality of life. Goal status: INITIAL baseline will be taken next session as able  2.  Pt will improve grip strength in right dominant hand to at least 40lbs for functional use at home and in IADLs. Goal status: INITIAL baseline will be taken next session as able  3.  Pt will improve A/ROM in right wrist flexion and extension from 68/50 to at least 65* each, to have functional motion for tasks like reach and grasp.  Goal status: INITIAL baseline will be taken next session as able  4.  Pt will improve strength in left elbow flexion and extension from 3-/5 MMT to at least 4+/5 MMT to have increased functional ability to carry out selfcare and higher-level homecare tasks with less difficulty. Goal status: INITIAL  5.  Pt will improve coordination skills in right dominant arm, as seen by better score on box and blocks testing to have increased functional ability to carry out fine motor tasks (fasteners, etc.) and more complex, coordinated IADLs (meal prep, sports, etc.).  Goal status: INITIAL baseline  will be taken next session as able  6.  Pt will decrease pain at rest from 6/10 to 2/10 or better to have better sleep and occupational participation in daily roles. Goal status: INITIAL   ASSESSMENT:  CLINICAL IMPRESSION: 04/17/23: She is now tolerating light grip training, but she still has some tentative motion and anxiety about moving her elbow that is likely holding her back.  OT also feels that this "pinching" near her carpal tunnel is likely from tight flexor carpi radialis due to wearing her wrist brace very often and not removing and enough to move or perform light exercises and active range of motion.  She was cautioned about this and asked to do more often motion in therapy and light stretches but still to be cautious not to hurt herself.    PLAN:  OT FREQUENCY: 1-2x/week  OT DURATION: 8 weeks through 05/09/2023 and up to 14 visits as needed  PLANNED INTERVENTIONS: 97168 OT Re-evaluation, 97535 self care/ADL training, 84166 therapeutic exercise, 97530 therapeutic activity, 97112 neuromuscular re-education, 97140 manual therapy, 97035 ultrasound, 97010 moist heat, 97010 cryotherapy, 97032 electrical stimulation (manual), 97760 Orthotics management and training, 06301 Splinting (initial encounter), [phone] Subsequent splinting/medication, scar mobilization, compression bandaging, psychosocial skills training, energy conservation, coping strategies training, and patient/family education  CONSULTED AND AGREED WITH PLAN OF CARE: Patient and family member/caregiver  PLAN FOR NEXT SESSION:   Check on new therapy putty and grip strength, ensure that motion is still improving, start light training for strength isometrically at the wrist and forearm and bicep and tricep as tolerated in the next 1-2 visits  Jeanne Terrance, OTR/L, CHT 04/17/2023, 5:15 PM

## 2023-04-17 ENCOUNTER — Encounter: Payer: Self-pay | Admitting: Rehabilitative and Restorative Service Providers"

## 2023-04-17 ENCOUNTER — Ambulatory Visit: Payer: PRIVATE HEALTH INSURANCE | Admitting: Family Medicine

## 2023-04-17 ENCOUNTER — Ambulatory Visit (INDEPENDENT_AMBULATORY_CARE_PROVIDER_SITE_OTHER): Payer: PRIVATE HEALTH INSURANCE | Admitting: Rehabilitative and Restorative Service Providers"

## 2023-04-17 DIAGNOSIS — R202 Paresthesia of skin: Secondary | ICD-10-CM | POA: Diagnosis not present

## 2023-04-17 DIAGNOSIS — M6281 Muscle weakness (generalized): Secondary | ICD-10-CM

## 2023-04-17 DIAGNOSIS — R600 Localized edema: Secondary | ICD-10-CM

## 2023-04-17 DIAGNOSIS — M25521 Pain in right elbow: Secondary | ICD-10-CM

## 2023-04-17 DIAGNOSIS — M25621 Stiffness of right elbow, not elsewhere classified: Secondary | ICD-10-CM

## 2023-04-17 DIAGNOSIS — M79601 Pain in right arm: Secondary | ICD-10-CM

## 2023-04-17 DIAGNOSIS — R278 Other lack of coordination: Secondary | ICD-10-CM

## 2023-04-22 NOTE — Therapy (Incomplete)
OUTPATIENT OCCUPATIONAL THERAPY TREATMENT NOTE  Patient Name: Carrie Gordon MRN: [id number] DOB:[birth year], 52 y.o., female Today's Date: 04/22/2023  PCP: Lilyan Punt, MD REFERRING PROVIDER:  Huel Cote, MD    END OF SESSION:     Past Medical History:  Diagnosis Date   Anxiety    Back problem    Bursitis of shoulder, right    Depression    Elevated hemoglobin A1c 2017   Hypertension    Low vitamin D level    Migraines    Uterine fibroid    Past Surgical History:  Procedure Laterality Date   ABDOMINAL HYSTERECTOMY  2010   TAH--Dr. Edward Jolly   MYOMECTOMY  2006   -Dr. Jamelle Haring ELBOW RELEASE/NIRSCHEL PROCEDURE Right 02/24/2023   Procedure: Right elbow lateral epicondyle debridement and repair;  Surgeon: Huel Cote, MD;  Location: [facility];  Service: Orthopedics;  Laterality: Right;   TUBAL LIGATION  2002   -Dr. Edward Jolly   Patient Active Problem List   Diagnosis Date Noted   Lateral epicondylitis, right elbow 02/24/2023   Primary hypertension 07/02/2021   Near syncope 12/31/2019   Other fatigue 12/31/2019   Anxiety 08/26/2017   Insomnia 12/19/2016   Migraines 12/06/2014    ONSET DATE: DOS 02/24/23  REFERRING DIAG: G56.01 (ICD-10-CM) - Carpal tunnel syndrome, right upper limb   THERAPY DIAG:  No diagnosis found.  Rationale for Evaluation and Treatment: Rehabilitation  PERTINENT HISTORY:  She states having Rt elbow pain for about 6 months and Rt hand numbness for about 1-2 months.   She was seeing Dr. Shon Baton for numbness and was sent to Dr. Steward Drone for elbow surgery.   Dr. Shon Baton plans on seeing her back for R CTS  04/07/23.    She states having Rt elbow pain for about 6 months and Rt hand numbness for about 1-2 months.   She was seeing Dr. Shon Baton for numbness and was sent to Dr. Steward Drone for elbow surgery.   Dr. Shon Baton plans on seeing her back for R CTS  04/07/23.   She had a syncopal episode today during her evaluation which limited the  evaluation greatly.  More information on this is listed under the "objective" section below. Her husband was present for most of the evaluation.  PRECAUTIONS: Other: anxiety and syncopal episodes ; RED FLAGS: None   WEIGHT BEARING RESTRICTIONS: Yes NWB in Rt arm now and for next 4-6 weeks    SUBJECTIVE:   SUBJECTIVE STATEMENT: She is 8 weeks s/p right lateral epicondyle debridement, repair.  She states ***.      PAIN:  Are you having pain?  *** Yes: NPRS scale: 3/10 in elbow and wrist has a "pin prick" feeling at times since injection.  Pain location: Rt lat elbow Pain description: aching, sharp pinpoints  Aggravating factors: movement, weightbearing Relieving factors: rest, ice   PATIENT GOALS: To improve use of her right dominant arm  NEXT MD VISIT: Approximately 1 month no appointment has been made yet    OBJECTIVE: (All objective assessments below are from initial evaluation on: 03/13/23 unless otherwise specified.)   HAND DOMINANCE: Right   ADLs: Overall ADLs: States decreased ability to grab, hold household objects, pain and difficulty to open containers, perform FMS tasks (manipulate fasteners on clothing), mild to moderate bathing problems as well.    FUNCTIONAL OUTCOME MEASURES: 03/20/23: Quick DASH 75% impairment today  (Higher % Score  =  More Impairment)     UPPER EXTREMITY ROM    Eval: OT  observed that she was able to move her right elbow approximately from 130 degrees of flexion to (-30*) extension.  Detailed measures were unable to be taken as she had a syncopal episode and much time was spent with her recovery today.  Details will be tested as soon as possible and tolerated  Shoulder to Wrist AROM Right 03/20/23 Rt 03/27/23 Rt 04/03/23 Rt 04/17/23 Rt 04/23/23  Shoulder flexion       Shoulder abduction       Shoulder extension       Shoulder internal rotation       Shoulder external rotation       Elbow flexion 126 91 104 116 ***  Elbow extension 0  (-5) (-30) (-20) ***  Forearm supination 55 50 51  ***  Forearm pronation  37 59 50  ***  Wrist flexion 68 33 52  ***  Wrist extension 50 60 28  ***  (Blank rows = not tested)   Hand AROM Right 03/20/23  Full Fist Ability (or Gap to Distal Palmar Crease) Full but tight  Thumb Opposition  (Kapandji Scale)  10/10 but tight  (Blank rows = not tested)   UPPER EXTREMITY MMT:     MMT Right TBD  Thumb IP flexion   Shoulder abduction   Shoulder adduction   Shoulder extension   Shoulder internal rotation   Shoulder external rotation   Middle trapezius   Lower trapezius   Elbow flexion   Elbow extension   Forearm supination   Forearm pronation   Wrist flexion   Wrist extension   Wrist ulnar deviation   Wrist radial deviation   (Blank rows = not tested)  HAND FUNCTION: 04/23/23:  grip strength Rt: ***#  04/17/23: grip strength Rt: 27#, Lt: 55#  Eval: Observed weakness in affected right hand.  Details will be tested when able and appropriate  COORDINATION: 04/17/23: Box and Blocks Test: Rt 35 Blocks today (57 is WFL)   Eval: Observed coordination impairments with affected right hand.  Details will be tested when able and appropriate  SENSATION: 03/20/23:  States some tingling through back of hand and fingers, in radial nerve distribution, mostly.   Eval:  Light touch intact today, though she states some paresthesia in the right hand especially after waking up from her syncopal episode.   EDEMA:   Eval:  Mildly swollen in right elbow today  COGNITION: Eval: Overall cognitive status: WFL for evaluation today, though very anxious   OBSERVATIONS:   Eval: While OT was discussing range of motion exercises with her, she loses consciousness during therapy momentarily.  She stated feeling a panic attack happening and took some form of pill and then OT stretched her husband from the lobby.  Shortly after she seemed to lose consciousness for about 5 minutes.  She was breathing and  her heart was beating and OT was attempting to take a blood pressure.  He states that she has passed out like this before.  When she regains consciousness her blood pressure was measured at 74/49 mm Hg, and after some rest and food, it went up to 95/74 mmHg.  She does state not eating at all today so far and is almost 4 PM at this time.  She seemed to recover after about 15 minutes, but the evaluation could not be performed thoroughly due to this episode.  They denied need for EMS or to go to the ED, so were recommended to see her PCP about this ASAP-preferably this  or next week.    TODAY'S TREATMENT:  04/23/23: ***  Check on new therapy putty and grip strength, ensure that motion is still improving, start light training for strength isometrically at the wrist and forearm and bicep and tricep as tolerated in the next 1-2 visits   04/17/23: She starts with active range of motion at the elbow which shows improvement in both flexion and extension but still tentative with motion and some anxiety.  OT puts moist heat around her elbow for 5 minutes while performing manual therapy IASTM to the volar part of her forearm and wrist where she has been feeling these "sharp spikes" of pain occasionally.  OT palpates tight flexor carpi radialis tendon and tight muscle belly and works these a bit with the tools, before offering the patient trigger point dry needling modality.  She states having this done before and consents to doing this treatment again today.  OT uses a 0.30 x 50mm inserted only partly into the flexor carpi radialis muscle belly and also the common flexor origin muscle belly achieving small twitch responses in both areas and minimal bleeding, no bruising, no lingering pain.  She states feeling a bit looser in the forearm after this treatment is done.  The FCR tendon and muscle belly feel less prominent afterwards to palpate.  Next, OT reviews her stretch program with her performing some with her and  having her perform them back.  Additionally, OT reviews nerve gliding for both median and radial nerves to help both carpal tunnel symptoms and elbow symptoms.  She tolerates this fairly well and was encouraged not to cause pain to herself when performing these.  Generally she was encouraged to be moving much more often and also now weaning from her wrist brace throughout the day for light activities or no weight activities or simply just when resting.  She performs the box and blocks Test for functional activity and to get a baseline measure also to encourage light functional motion.  Of course she does this without her wrist brace on today.  She also tolerates light grip training today done isometrically and is provided pink therapy putty to work on this at home.  She should only do this 2-3 times a day, nonpainfully, isometrically, feeling an active stretch through the forearm and elbow.  She did this today multiple times feeling no significant pain or tension through the arm.  She thanks the therapist and leaves stating feeling no significant pain or problems or tingling or numbness at the end of the session.   PATIENT EDUCATION: Education details: See tx section above for details  Person educated: Patient Education method: Verbal Instruction, Teach back, Handouts  Education comprehension: States and demonstrates understanding, Additional Education required    HOME EXERCISE PROGRAM: Access Code: [email or web address] URL: [email or web address] Date: 03/13/2023 Prepared by: Fannie Knee   GOALS: Goals reviewed with patient? Yes   SHORT TERM GOALS: (STG required if POC>30 days) Target Date: 03/28/23  Pt will obtain protective, custom orthotic. Goal status: TBD/PRN  2.  Pt will demo/state understanding of initial HEP to improve pain levels and prerequisite motion. Goal status: 04/03/23: MET   LONG TERM GOALS: Target Date: 05/09/23  Pt will improve functional ability by  decreased impairment per Quick DASH assessment from 75% to 25% or better, for better quality of life. Goal status: INITIAL baseline will be taken next session as able  2.  Pt will improve grip strength in right dominant hand to at least 40lbs for functional  use at home and in IADLs. Goal status: INITIAL baseline will be taken next session as able  3.  Pt will improve A/ROM in right wrist flexion and extension from 68/50 to at least 65* each, to have functional motion for tasks like reach and grasp.  Goal status: INITIAL baseline will be taken next session as able  4.  Pt will improve strength in left elbow flexion and extension from 3-/5 MMT to at least 4+/5 MMT to have increased functional ability to carry out selfcare and higher-level homecare tasks with less difficulty. Goal status: INITIAL  5.  Pt will improve coordination skills in right dominant arm, as seen by better score on box and blocks testing to have increased functional ability to carry out fine motor tasks (fasteners, etc.) and more complex, coordinated IADLs (meal prep, sports, etc.).  Goal status: INITIAL baseline will be taken next session as able  6.  Pt will decrease pain at rest from 6/10 to 2/10 or better to have better sleep and occupational participation in daily roles. Goal status: INITIAL   ASSESSMENT:  CLINICAL IMPRESSION: 04/23/23: ***  04/17/23: She is now tolerating light grip training, but she still has some tentative motion and anxiety about moving her elbow that is likely holding her back.  OT also feels that this "pinching" near her carpal tunnel is likely from tight flexor carpi radialis due to wearing her wrist brace very often and not removing and enough to move or perform light exercises and active range of motion.  She was cautioned about this and asked to do more often motion in therapy and light stretches but still to be cautious not to hurt herself.    PLAN:  OT FREQUENCY: 1-2x/week  OT  DURATION: 8 weeks through 05/09/2023 and up to 14 visits as needed  PLANNED INTERVENTIONS: 97168 OT Re-evaluation, 97535 self care/ADL training, 45409 therapeutic exercise, 97530 therapeutic activity, 97112 neuromuscular re-education, 97140 manual therapy, 97035 ultrasound, 97010 moist heat, 97010 cryotherapy, 97032 electrical stimulation (manual), 97760 Orthotics management and training, 81191 Splinting (initial encounter), [phone] Subsequent splinting/medication, scar mobilization, compression bandaging, psychosocial skills training, energy conservation, coping strategies training, and patient/family education  CONSULTED AND AGREED WITH PLAN OF CARE: Patient and family member/caregiver  PLAN FOR NEXT SESSION:   ***  Fannie Knee, OTR/L, CHT 04/22/2023, 5:10 PM

## 2023-04-23 ENCOUNTER — Encounter: Payer: PRIVATE HEALTH INSURANCE | Admitting: Rehabilitative and Restorative Service Providers"

## 2023-04-23 NOTE — Therapy (Signed)
OUTPATIENT OCCUPATIONAL THERAPY TREATMENT NOTE  Patient Name: Carrie Gordon MRN: [id number] DOB:[birth year], 52 y.o., female Today's Date: 04/24/2023  PCP: Lilyan Punt, MD REFERRING PROVIDER:  Huel Cote, MD    END OF SESSION:  OT End of Session - 04/24/23 1151     Visit Number 6    Number of Visits 14    Date for OT Re-Evaluation 05/09/23    Authorization Type "Generic"    OT Start Time 1151    OT Stop Time 1237    OT Time Calculation (min) 46 min    Activity Tolerance Patient tolerated treatment well;Patient limited by fatigue;Patient limited by pain;No increased pain    Behavior During Therapy [facility] for tasks assessed/performed               Past Medical History:  Diagnosis Date   Anxiety    Back problem    Bursitis of shoulder, right    Depression    Elevated hemoglobin A1c 2017   Hypertension    Low vitamin D level    Migraines    Uterine fibroid    Past Surgical History:  Procedure Laterality Date   ABDOMINAL HYSTERECTOMY  2010   TAH--Dr. Edward Jolly   MYOMECTOMY  2006   -Dr. Jamelle Haring ELBOW RELEASE/NIRSCHEL PROCEDURE Right 02/24/2023   Procedure: Right elbow lateral epicondyle debridement and repair;  Surgeon: Huel Cote, MD;  Location: [facility] ORS;  Service: Orthopedics;  Laterality: Right;   TUBAL LIGATION  2002   -Dr. Edward Jolly   Patient Active Problem List   Diagnosis Date Noted   Lateral epicondylitis, right elbow 02/24/2023   Primary hypertension 07/02/2021   Near syncope 12/31/2019   Other fatigue 12/31/2019   Anxiety 08/26/2017   Insomnia 12/19/2016   Migraines 12/06/2014    ONSET DATE: DOS 02/24/23  REFERRING DIAG: G56.01 (ICD-10-CM) - Carpal tunnel syndrome, right upper limb   THERAPY DIAG:  Localized edema  Other lack of coordination  Pain in right elbow  Paresthesia of skin  Pain in right arm  Muscle weakness (generalized)  Stiffness of right elbow, not elsewhere classified  Other symptoms and signs  involving the musculoskeletal system  Rationale for Evaluation and Treatment: Rehabilitation  PERTINENT HISTORY:  She states having Rt elbow pain for about 6 months and Rt hand numbness for about 1-2 months.   She was seeing Dr. Shon Baton for numbness and was sent to Dr. Steward Drone for elbow surgery.   Dr. Shon Baton plans on seeing her back for R CTS  04/07/23.    She states having Rt elbow pain for about 6 months and Rt hand numbness for about 1-2 months.   She was seeing Dr. Shon Baton for numbness and was sent to Dr. Steward Drone for elbow surgery.   Dr. Shon Baton plans on seeing her back for R CTS  04/07/23.   She had a syncopal episode today during her evaluation which limited the evaluation greatly.  More information on this is listed under the "objective" section below. Her husband was present for most of the evaluation.  PRECAUTIONS: Other: anxiety and syncopal episodes ; RED FLAGS: None   WEIGHT BEARING RESTRICTIONS: Yes NWB in Rt arm now and for next 4-6 weeks    SUBJECTIVE:   SUBJECTIVE STATEMENT: She is 8 weeks s/p right lateral epicondyle debridement, repair.  She arrives a bit late, states therapy putty is going well, no significant pain now. States weaning from wrist brace 10% of the week- was recommended to go up to 30-40 %  this week and watch her soreness.      PAIN:  Are you having pain?   Yes: NPRS scale: 0/10 in elbow, up to 6/10 at worst very infrequently Pain location: Rt lat elbow Pain description: aching, sharp pinpoints  Aggravating factors: movement, weightbearing Relieving factors: rest, ice   PATIENT GOALS: To improve use of her right dominant arm  NEXT MD VISIT: Approximately 1 month no appointment has been made yet    OBJECTIVE: (All objective assessments below are from initial evaluation on: 03/13/23 unless otherwise specified.)   HAND DOMINANCE: Right   ADLs: Overall ADLs: States decreased ability to grab, hold household objects, pain and difficulty to open  containers, perform FMS tasks (manipulate fasteners on clothing), mild to moderate bathing problems as well.    FUNCTIONAL OUTCOME MEASURES: 03/20/23: Quick DASH 75% impairment today  (Higher % Score  =  More Impairment)     UPPER EXTREMITY ROM    Eval: OT observed that she was able to move her right elbow approximately from 130 degrees of flexion to (-30*) extension.  Detailed measures were unable to be taken as she had a syncopal episode and much time was spent with her recovery today.  Details will be tested as soon as possible and tolerated  Shoulder to Wrist AROM Right 03/20/23 Rt 03/27/23 Rt 04/03/23 Rt 04/17/23 Rt 04/24/23  Shoulder flexion       Shoulder abduction       Shoulder extension       Shoulder internal rotation       Shoulder external rotation       Elbow flexion 126 91 104 116 123  Elbow extension 0 (-5) (-30) (-20) (-15)  Forearm supination 55 50 51  68  Forearm pronation  37 59 50  70  Wrist flexion 68 33 52  62  Wrist extension 50 60 28  66  (Blank rows = not tested)   Hand AROM Right 03/20/23  Full Fist Ability (or Gap to Distal Palmar Crease) Full but tight  Thumb Opposition  (Kapandji Scale)  10/10 but tight  (Blank rows = not tested)   UPPER EXTREMITY MMT:     MMT Right TBD  Thumb IP flexion   Shoulder abduction   Shoulder adduction   Shoulder extension   Shoulder internal rotation   Shoulder external rotation   Middle trapezius   Lower trapezius   Elbow flexion   Elbow extension   Forearm supination   Forearm pronation   Wrist flexion   Wrist extension   Wrist ulnar deviation   Wrist radial deviation   (Blank rows = not tested)  HAND FUNCTION: 04/24/23:  grip strength Rt: 27#  04/17/23: grip strength Rt: 27#, Lt: 55#  Eval: Observed weakness in affected right hand.  Details will be tested when able and appropriate  COORDINATION: 04/17/23: Box and Blocks Test: Rt 35 Blocks today (57 is WFL)   Eval: Observed coordination  impairments with affected right hand.  Details will be tested when able and appropriate  SENSATION: 03/20/23:  States some tingling through back of hand and fingers, in radial nerve distribution, mostly.   Eval:  Light touch intact today, though she states some paresthesia in the right hand especially after waking up from her syncopal episode.   EDEMA:   Eval:  Mildly swollen in right elbow today  COGNITION: Eval: Overall cognitive status: WFL for evaluation today, though very anxious   OBSERVATIONS:   Eval: While OT was discussing range  of motion exercises with her, she loses consciousness during therapy momentarily.  She stated feeling a panic attack happening and took some form of pill and then OT stretched her husband from the lobby.  Shortly after she seemed to lose consciousness for about 5 minutes.  She was breathing and her heart was beating and OT was attempting to take a blood pressure.  He states that she has passed out like this before.  When she regains consciousness her blood pressure was measured at 74/49 mm Hg, and after some rest and food, it went up to 95/74 mmHg.  She does state not eating at all today so far and is almost 4 PM at this time.  She seemed to recover after about 15 minutes, but the evaluation could not be performed thoroughly due to this episode.  They denied need for EMS or to go to the ED, so were recommended to see her PCP about this ASAP-preferably this or next week.    TODAY'S TREATMENT:  04/24/23: She starts with active range of motion today which continues to show slow but steady improvements while keeping pain low.  As she is now 8 weeks postop, OT upgrades her home exercise program significantly to include new stretches for the elbow and tricep as well as "easier" isometric training for the elbow and wrist and also a "harder step" to do eccentric's and even some concentric bicep curls.  She was given copious education today with demonstration and performance  to ensure her understanding.  She was told numerous times that she should be stretching 3-4 times a day, and once or twice a day working on these easier strengthening portions.  After about 3 or 5 days, if the easier isometric strengthening is very well-tolerated, she can start working on eccentric strengthening.  She states understanding and also that she should be weaning from her wrist brace more now.  She should still wear this when driving, cooking, shopping, but other than those times she should try to wean from it and even at night.  OT is slightly concerned that she is a bit "hypersensitive" in terms of fear and guarding and not letting her arm swing loosely, etc.  Weaning from this brace will help with these things.  She leaves in no significant pain stating understanding all directions.   Exercises - Median Nerve Flossing  - 2-3 x daily - 5-10 reps - elbow flexion STRETCH  - 3-4 x daily - 3-5 reps - 15 sec hold - Fridge Door Stretch  - 4 x daily - 3-5 reps - 15 sec hold - Tricep Stretch- DO SEATED BY TABLE  - 3-4 x daily - 3-5 reps - 15 hold - Forearm Pronation Stretch  - 3-4 x daily - 3-5 reps - 15 sec hold - Wrist Flexion Stretch  - 4 x daily - 3-5 reps - 15 sec hold - Wrist Prayer Stretch  - 4 x daily - 3-5 reps - 15 sec hold - Standing Radial Nerve Glide  - 4-6 x daily - 1 sets - 10-15 reps - Full Fist  - 2-3 x daily - 5 reps - 10 sec hold - Seated Isometric Elbow Extension  - 2-3 x daily - 5-10 reps - 10 sec hold - Seated Isometric Elbow Flexion  - 4-6 x daily - 1 sets - 10-15 reps - Seated Isometric Wrist Flexion Neutral  - 4-6 x daily - 1 sets - 10-15 reps - Seated Isometric Wrist Extension  - 4-6 x  daily - 1 sets - 10-15 reps Wait 3-5 days, and move on to these if non-painful:  - "Negative" (eccentric) bicep strength   - 4-6 x daily - 1 sets - 10-15 reps - Seated Eccentric Wrist Extension  - 4-6 x daily - 1 sets - 10-15 reps - Seated Wrist Flexion with Dumbbell  - 2 x daily  - 1-2 sets - 5-15 reps - Standing Bicep Curls with Resistance  - 2-4 x daily - 1-2 sets - 10-15 reps   PATIENT EDUCATION: Education details: See tx section above for details  Person educated: Patient Education method: Verbal Instruction, Teach back, Handouts  Education comprehension: States and demonstrates understanding, Additional Education required    HOME EXERCISE PROGRAM: Access Code: [email or web address] URL: [email or web address] Date: 03/13/2023 Prepared by: Fannie Knee   GOALS: Goals reviewed with patient? Yes   SHORT TERM GOALS: (STG required if POC>30 days) Target Date: 03/28/23  Pt will obtain protective, custom orthotic. Goal status: TBD/PRN  2.  Pt will demo/state understanding of initial HEP to improve pain levels and prerequisite motion. Goal status: 04/03/23: MET   LONG TERM GOALS: Target Date: 05/09/23  Pt will improve functional ability by decreased impairment per Quick DASH assessment from 75% to 25% or better, for better quality of life. Goal status: INITIAL baseline will be taken next session as able  2.  Pt will improve grip strength in right dominant hand to at least 40lbs for functional use at home and in IADLs. Goal status: INITIAL baseline will be taken next session as able  3.  Pt will improve A/ROM in right wrist flexion and extension from 68/50 to at least 65* each, to have functional motion for tasks like reach and grasp.  Goal status: INITIAL baseline will be taken next session as able  4.  Pt will improve strength in left elbow flexion and extension from 3-/5 MMT to at least 4+/5 MMT to have increased functional ability to carry out selfcare and higher-level homecare tasks with less difficulty. Goal status: INITIAL  5.  Pt will improve coordination skills in right dominant arm, as seen by better score on box and blocks testing to have increased functional ability to carry out fine motor tasks (fasteners, etc.) and more complex,  coordinated IADLs (meal prep, sports, etc.).  Goal status: INITIAL baseline will be taken next session as able  6.  Pt will decrease pain at rest from 6/10 to 2/10 or better to have better sleep and occupational participation in daily roles. Goal status: INITIAL   ASSESSMENT:  CLINICAL IMPRESSION: 04/24/23: She is now tolerating isometric strengthening at the elbow and wrist, and was told to wean from her brace more now and also upgraded to stronger stretches.  So far so good, but we will watch out for any exacerbations.    04/17/23: She is now tolerating light grip training, but she still has some tentative motion and anxiety about moving her elbow that is likely holding her back.  OT also feels that this "pinching" near her carpal tunnel is likely from tight flexor carpi radialis due to wearing her wrist brace very often and not removing and enough to move or perform light exercises and active range of motion.  She was cautioned about this and asked to do more often motion in therapy and light stretches but still to be cautious not to hurt herself.    PLAN:  OT FREQUENCY: 1-2x/week  OT DURATION: 8 weeks through 05/09/2023 and up to 14 visits as  needed  PLANNED INTERVENTIONS: 97168 OT Re-evaluation, 97535 self care/ADL training, 13086 therapeutic exercise, 97530 therapeutic activity, 97112 neuromuscular re-education, 97140 manual therapy, 97035 ultrasound, 97010 moist heat, 97010 cryotherapy, 97032 electrical stimulation (manual), 97760 Orthotics management and training, 57846 Splinting (initial encounter), [phone] Subsequent splinting/medication, scar mobilization, compression bandaging, psychosocial skills training, energy conservation, coping strategies training, and patient/family education  CONSULTED AND AGREED WITH PLAN OF CARE: Patient and family member/caregiver  PLAN FOR NEXT SESSION:   Check on large new HEP including new stretching and isometric and eccentric strengthening.   Keep weaning from brace and keep increasing strength as tolerated.  Fannie Knee, OTR/L, CHT 04/24/2023, 12:42 PM

## 2023-04-24 ENCOUNTER — Encounter: Payer: Self-pay | Admitting: Rehabilitative and Restorative Service Providers"

## 2023-04-24 ENCOUNTER — Ambulatory Visit (INDEPENDENT_AMBULATORY_CARE_PROVIDER_SITE_OTHER): Payer: PRIVATE HEALTH INSURANCE | Admitting: Rehabilitative and Restorative Service Providers"

## 2023-04-24 ENCOUNTER — Encounter: Payer: PRIVATE HEALTH INSURANCE | Admitting: Rehabilitative and Restorative Service Providers"

## 2023-04-24 DIAGNOSIS — M25621 Stiffness of right elbow, not elsewhere classified: Secondary | ICD-10-CM

## 2023-04-24 DIAGNOSIS — R278 Other lack of coordination: Secondary | ICD-10-CM | POA: Diagnosis not present

## 2023-04-24 DIAGNOSIS — M25521 Pain in right elbow: Secondary | ICD-10-CM | POA: Diagnosis not present

## 2023-04-24 DIAGNOSIS — R600 Localized edema: Secondary | ICD-10-CM | POA: Diagnosis not present

## 2023-04-24 DIAGNOSIS — R202 Paresthesia of skin: Secondary | ICD-10-CM

## 2023-04-24 DIAGNOSIS — R29898 Other symptoms and signs involving the musculoskeletal system: Secondary | ICD-10-CM

## 2023-04-24 DIAGNOSIS — M79601 Pain in right arm: Secondary | ICD-10-CM

## 2023-04-24 DIAGNOSIS — M6281 Muscle weakness (generalized): Secondary | ICD-10-CM

## 2023-04-28 NOTE — Therapy (Signed)
OUTPATIENT OCCUPATIONAL THERAPY TREATMENT NOTE  Patient Name: Carrie Gordon MRN: [id number] DOB:1970-10-07, 52 y.o., female Today's Date: 04/29/2023  PCP: Lilyan Punt, MD REFERRING PROVIDER:  Huel Cote, MD    END OF SESSION:  OT End of Session - 04/29/23 0805     Visit Number 7    Number of Visits 14    Date for OT Re-Evaluation 05/09/23    Authorization Type "Generic"    OT Start Time 0805    OT Stop Time 0846    OT Time Calculation (min) 41 min    Activity Tolerance Patient tolerated treatment well;Patient limited by fatigue;Patient limited by pain;No increased pain    Behavior During Therapy [facility] for tasks assessed/performed                Past Medical History:  Diagnosis Date   Anxiety    Back problem    Bursitis of shoulder, right    Depression    Elevated hemoglobin A1c 2017   Hypertension    Low vitamin D level    Migraines    Uterine fibroid    Past Surgical History:  Procedure Laterality Date   ABDOMINAL HYSTERECTOMY  2010   TAH--Dr. Edward Jolly   MYOMECTOMY  2006   -Dr. Jamelle Haring ELBOW RELEASE/NIRSCHEL PROCEDURE Right 02/24/2023   Procedure: Right elbow lateral epicondyle debridement and repair;  Surgeon: Huel Cote, MD;  Location: [facility] ORS;  Service: Orthopedics;  Laterality: Right;   TUBAL LIGATION  2002   -Dr. Edward Jolly   Patient Active Problem List   Diagnosis Date Noted   Lateral epicondylitis, right elbow 02/24/2023   Primary hypertension 07/02/2021   Near syncope 12/31/2019   Other fatigue 12/31/2019   Anxiety 08/26/2017   Insomnia 12/19/2016   Migraines 12/06/2014    ONSET DATE: DOS 02/24/23  REFERRING DIAG: G56.01 (ICD-10-CM) - Carpal tunnel syndrome, right upper limb   THERAPY DIAG:  Localized edema  Other lack of coordination  Pain in right elbow  Paresthesia of skin  Pain in right arm  Muscle weakness (generalized)  Stiffness of right elbow, not elsewhere classified  Rationale for Evaluation  and Treatment: Rehabilitation  PERTINENT HISTORY:  She states having Rt elbow pain for about 6 months and Rt hand numbness for about 1-2 months.   She was seeing Dr. Shon Baton for numbness and was sent to Dr. Steward Drone for elbow surgery.   Dr. Shon Baton plans on seeing her back for R CTS  04/07/23.    She states having Rt elbow pain for about 6 months and Rt hand numbness for about 1-2 months.   She was seeing Dr. Shon Baton for numbness and was sent to Dr. Steward Drone for elbow surgery.   Dr. Shon Baton plans on seeing her back for R CTS  04/07/23.   She had a syncopal episode today during her evaluation which limited the evaluation greatly.  More information on this is listed under the "objective" section below. Her husband was present for most of the evaluation.  PRECAUTIONS: Other: anxiety and syncopal episodes ; RED FLAGS: None   WEIGHT BEARING RESTRICTIONS: Yes NWB in Rt arm now and for next 4-6 weeks    SUBJECTIVE:   SUBJECTIVE STATEMENT: She is 9 weeks s/p right lateral epicondyle debridement, repair.  She states  new strength has gone well.      PAIN:  Are you having pain?   Yes: NPRS scale: 0/10 in elbow, up to 4/10 at worst very infrequently Pain location: Rt lat elbow  Pain description: aching, sharp pinpoints  Aggravating factors: movement, weightbearing Relieving factors: rest, ice   PATIENT GOALS: To improve use of her right dominant arm  NEXT MD VISIT: Approximately 1 month no appointment has been made yet    OBJECTIVE: (All objective assessments below are from initial evaluation on: 03/13/23 unless otherwise specified.)   HAND DOMINANCE: Right   ADLs: Overall ADLs: States decreased ability to grab, hold household objects, pain and difficulty to open containers, perform FMS tasks (manipulate fasteners on clothing), mild to moderate bathing problems as well.    FUNCTIONAL OUTCOME MEASURES: 03/20/23: Quick DASH 75% impairment today  (Higher % Score  =  More Impairment)     UPPER  EXTREMITY ROM    Eval: OT observed that she was able to move her right elbow approximately from 130 degrees of flexion to (-30*) extension.  Detailed measures were unable to be taken as she had a syncopal episode and much time was spent with her recovery today.  Details will be tested as soon as possible and tolerated  Shoulder to Wrist AROM Right 03/20/23 Rt 03/27/23 Rt 04/03/23 Rt 04/17/23 Rt 04/24/23 Rt 04/29/23  Shoulder flexion        Shoulder abduction        Shoulder extension        Shoulder internal rotation        Shoulder external rotation        Elbow flexion 126 91 104 116 123 115 AROM (135 PROM)  Elbow extension 0 (-5) (-30) (-20) (-15) (-18)  Forearm supination 55 50 51  68   Forearm pronation  37 59 50  70   Wrist flexion 68 33 52  62 60  Wrist extension 50 60 28  66 50  (Blank rows = not tested)   Hand AROM Right 03/20/23  Full Fist Ability (or Gap to Distal Palmar Crease) Full but tight  Thumb Opposition  (Kapandji Scale)  10/10 but tight  (Blank rows = not tested)   UPPER EXTREMITY MMT:     MMT Right 04/29/23  Elbow flexion 4-/5  Elbow extension 4-/5  Forearm supination 4/5  Forearm pronation 4+/5  Wrist flexion 5/5  Wrist extension 4-/5  Wrist ulnar deviation   Wrist radial deviation   (Blank rows = not tested)  HAND FUNCTION: 04/29/23: Rt grip: 30#   04/24/23:  grip strength Rt: 27#  04/17/23: grip strength Rt: 27#, Lt: 55#  Eval: Observed weakness in affected right hand.  Details will be tested when able and appropriate  COORDINATION: 04/29/23: BBT: 58 blocks today   04/17/23: Box and Blocks Test: Rt 35 Blocks today (57 is WFL)   Eval: Observed coordination impairments with affected right hand.  Details will be tested when able and appropriate  SENSATION: 03/20/23:  States some tingling through back of hand and fingers, in radial nerve distribution, mostly.   Eval:  Light touch intact today, though she states some paresthesia in the  right hand especially after waking up from her syncopal episode.   EDEMA:   Eval:  Mildly swollen in right elbow today  OBSERVATIONS:   04/29/23: less tender to lat epicondyle now but seems to have mor erigiity in muscle bellies themselves. Still some guarding from pain and anxiety    Eval: While OT was discussing range of motion exercises with her, she loses consciousness during therapy momentarily.  She stated feeling a panic attack happening and took some form of pill and then OT stretched her  husband from the lobby.  Shortly after she seemed to lose consciousness for about 5 minutes.  She was breathing and her heart was beating and OT was attempting to take a blood pressure.  He states that she has passed out like this before.  When she regains consciousness her blood pressure was measured at 74/49 mm Hg, and after some rest and food, it went up to 95/74 mmHg.  She does state not eating at all today so far and is almost 4 PM at this time.  She seemed to recover after about 15 minutes, but the evaluation could not be performed thoroughly due to this episode.  They denied need for EMS or to go to the ED, so were recommended to see her PCP about this ASAP-preferably this or next week.    TODAY'S TREATMENT:  04/29/23:  She starts with AROM for exercise and new measures showing some lack of AROM vs PROM possibly due to guarding/muscle tightness.   OT does manual therapy IASTM and percussion along Rt dorsal FA and triceps/biceps to loosen tight muscles before leading her through stretches at elbow and wrist. She then uses UBE for fnl push/pull at 2.5 resistance x 4 mins @ 45 RPM, feeling some fatigue and soreness at end.   Lastly, we review isometric and eccentric strength for elbow, forearm as below. Tolerates well  2# eccentric bicep curls in supination x 8-10 Yellow t-band tricep press x10 Radial nerve glides x5 (for nerves, mobility, cool down)    PATIENT EDUCATION: Education details: See tx  section above for details  Person educated: Patient Education method: Verbal Instruction, Teach back, Handouts  Education comprehension: States and demonstrates understanding, Additional Education required    HOME EXERCISE PROGRAM: Access Code: [email or web address] URL: [email or web address] Date: 03/13/2023 Prepared by: Fannie Knee   GOALS: Goals reviewed with patient? Yes   SHORT TERM GOALS: (STG required if POC>30 days) Target Date: 03/28/23  Pt will obtain protective, custom orthotic. Goal status: TBD/PRN  2.  Pt will demo/state understanding of initial HEP to improve pain levels and prerequisite motion. Goal status: 04/03/23: MET   LONG TERM GOALS: Target Date: 05/09/23  Pt will improve functional ability by decreased impairment per Quick DASH assessment from 75% to 25% or better, for better quality of life. Goal status: INITIAL baseline will be taken next session as able  2.  Pt will improve grip strength in right dominant hand to at least 40lbs for functional use at home and in IADLs. Goal status: INITIAL baseline will be taken next session as able  3.  Pt will improve A/ROM in right wrist flexion and extension from 68/50 to at least 65* each, to have functional motion for tasks like reach and grasp.  Goal status: INITIAL baseline will be taken next session as able  4.  Pt will improve strength in left elbow flexion and extension from 3-/5 MMT to at least 4+/5 MMT to have increased functional ability to carry out selfcare and higher-level homecare tasks with less difficulty. Goal status: INITIAL  5.  Pt will improve coordination skills in right dominant arm, as seen by better score on box and blocks testing to have increased functional ability to carry out fine motor tasks (fasteners, etc.) and more complex, coordinated IADLs (meal prep, sports, etc.).  Goal status: INITIAL baseline will be taken next session as able  6.  Pt will decrease pain at rest from  6/10 to 2/10 or better to have better sleep and occupational participation in  daily roles. Goal status: INITIAL   ASSESSMENT:  CLINICAL IMPRESSION: 04/29/23: Doing well with strength but still a bit guarded and wanting to wear wrist brace... encouraging lightly, slowly building up strength and tolerance to avoid exacerbations.   04/24/23: She is now tolerating isometric strengthening at the elbow and wrist, and was told to wean from her brace more now and also upgraded to stronger stretches.  So far so good, but we will watch out for any exacerbations.    04/17/23: She is now tolerating light grip training, but she still has some tentative motion and anxiety about moving her elbow that is likely holding her back.  OT also feels that this "pinching" near her carpal tunnel is likely from tight flexor carpi radialis due to wearing her wrist brace very often and not removing and enough to move or perform light exercises and active range of motion.  She was cautioned about this and asked to do more often motion in therapy and light stretches but still to be cautious not to hurt herself.    PLAN:  OT FREQUENCY: 1-2x/week  OT DURATION: 8 weeks through 05/09/2023 and up to 14 visits as needed  PLANNED INTERVENTIONS: 97168 OT Re-evaluation, 97535 self care/ADL training, 95638 therapeutic exercise, 97530 therapeutic activity, 97112 neuromuscular re-education, 97140 manual therapy, 97035 ultrasound, 97010 moist heat, 97010 cryotherapy, 97032 electrical stimulation (manual), 97760 Orthotics management and training, 75643 Splinting (initial encounter), [phone] Subsequent splinting/medication, scar mobilization, compression bandaging, psychosocial skills training, energy conservation, coping strategies training, and patient/family education  CONSULTED AND AGREED WITH PLAN OF CARE: Patient and family member/caregiver  PLAN FOR NEXT SESSION:   F/u in 2 weeks after the holiday and hopefully we will see full  AROM at elbow and better strength    Fannie Knee, OTR/L, CHT 04/29/2023, 8:49 AM

## 2023-04-29 ENCOUNTER — Ambulatory Visit (INDEPENDENT_AMBULATORY_CARE_PROVIDER_SITE_OTHER): Payer: PRIVATE HEALTH INSURANCE | Admitting: Rehabilitative and Restorative Service Providers"

## 2023-04-29 ENCOUNTER — Encounter: Payer: Self-pay | Admitting: Rehabilitative and Restorative Service Providers"

## 2023-04-29 DIAGNOSIS — R278 Other lack of coordination: Secondary | ICD-10-CM | POA: Diagnosis not present

## 2023-04-29 DIAGNOSIS — M6281 Muscle weakness (generalized): Secondary | ICD-10-CM

## 2023-04-29 DIAGNOSIS — M79601 Pain in right arm: Secondary | ICD-10-CM

## 2023-04-29 DIAGNOSIS — R202 Paresthesia of skin: Secondary | ICD-10-CM

## 2023-04-29 DIAGNOSIS — R600 Localized edema: Secondary | ICD-10-CM | POA: Diagnosis not present

## 2023-04-29 DIAGNOSIS — M25621 Stiffness of right elbow, not elsewhere classified: Secondary | ICD-10-CM

## 2023-04-29 DIAGNOSIS — M25521 Pain in right elbow: Secondary | ICD-10-CM | POA: Diagnosis not present

## 2023-04-30 ENCOUNTER — Encounter: Payer: PRIVATE HEALTH INSURANCE | Admitting: Rehabilitative and Restorative Service Providers"

## 2023-05-01 ENCOUNTER — Encounter: Payer: PRIVATE HEALTH INSURANCE | Admitting: Rehabilitative and Restorative Service Providers"

## 2023-05-08 ENCOUNTER — Encounter: Payer: PRIVATE HEALTH INSURANCE | Admitting: Rehabilitative and Restorative Service Providers"

## 2023-05-13 NOTE — Therapy (Signed)
 OUTPATIENT OCCUPATIONAL THERAPY TREATMENT & DISCHARGE NOTE  Patient Name: Carrie Gordon MRN: [id number] DOB:24-Mar-1971, 52 y.o., female Today's Date: 05/16/2023  PCP: Alphonsa Hamilton, MD REFERRING PROVIDER:  Genelle Standing, MD         OCCUPATIONAL THERAPY DISCHARGE SUMMARY  Visits from Start of Care: 8  Current functional level related to goals / functional outcomes:  05/16/23: She has now met all long-term goals with the exception of manual muscle testing which has nearly been met.  She agrees that she can continue to work on her stretches and her strengthening over the next month cautiously, and wearing a compressive elbow sleeve for support.  She should be able to return to all activities after 12 weeks postop as long as she does this in a nonaggressive fashion which was explained to her today.  We will discharge successfully   Education / Equipment: Pt has all needed materials and education. Pt understands how to continue on with self-management. See tx notes for more details.   Patient agrees to discharge due to max benefits received from outpatient occupational therapy / hand therapy at this time.    PLAN: OT FREQUENCY: 1 additional visit on 05/16/23 OT DURATION: 1 additional week from 05/09/2023  - 05/16/23 to cover today's last visit   Melvenia Ada, OTR/L, CHT 05/16/23      END OF SESSION:  OT End of Session - 05/16/23 1055     Visit Number 8    Number of Visits 8    Date for OT Re-Evaluation 05/16/23    Authorization Type Generic    OT Start Time 1102    OT Stop Time 1133    OT Time Calculation (min) 31 min    Activity Tolerance Patient tolerated treatment well;No increased pain;Patient limited by fatigue    Behavior During Therapy [name] for tasks assessed/performed             Past Medical History:  Diagnosis Date   Anxiety    Back problem    Bursitis of shoulder, right    Depression    Elevated hemoglobin A1c 2017   Hypertension     Low vitamin D  level    Migraines    Uterine fibroid    Past Surgical History:  Procedure Laterality Date   ABDOMINAL HYSTERECTOMY  2010   TAH--Dr. Nikki   MYOMECTOMY  2006   -Dr. Nikki KRAS ELBOW RELEASE/NIRSCHEL PROCEDURE Right 02/24/2023   Procedure: Right elbow lateral epicondyle debridement and repair;  Surgeon: Genelle Standing, MD;  Location: [facility] ORS;  Service: Orthopedics;  Laterality: Right;   TUBAL LIGATION  2002   -Dr. Nikki   Patient Active Problem List   Diagnosis Date Noted   Lateral epicondylitis, right elbow 02/24/2023   Primary hypertension 07/02/2021   Near syncope 12/31/2019   Other fatigue 12/31/2019   Anxiety 08/26/2017   Insomnia 12/19/2016   Migraines 12/06/2014    ONSET DATE: DOS 02/24/23  REFERRING DIAG: G56.01 (ICD-10-CM) - Carpal tunnel syndrome, right upper limb   THERAPY DIAG:  Localized edema  Other lack of coordination  Pain in right elbow  Paresthesia of skin  Pain in right arm  Muscle weakness (generalized)  Stiffness of right elbow, not elsewhere classified  Rationale for Evaluation and Treatment: Rehabilitation  PERTINENT HISTORY:  She states having Rt elbow pain for about 6 months and Rt hand numbness for about 1-2 months.   She was seeing Dr. Burnetta for numbness and was sent to Dr. Genelle for  elbow surgery.   Dr. Burnetta plans on seeing her back for R CTS  04/07/23.    She states having Rt elbow pain for about 6 months and Rt hand numbness for about 1-2 months.   She was seeing Dr. Burnetta for numbness and was sent to Dr. Genelle for elbow surgery.   Dr. Burnetta plans on seeing her back for R CTS  04/07/23.   She had a syncopal episode today during her evaluation which limited the evaluation greatly.  More information on this is listed under the objective section below. Her husband was present for most of the evaluation.  PRECAUTIONS: Other: anxiety and syncopal episodes ; RED FLAGS: None   WEIGHT BEARING RESTRICTIONS: Yes  NWB in Rt arm now and for next 4-6 weeks    SUBJECTIVE:   SUBJECTIVE STATEMENT: She is 11 weeks s/p right lateral epicondyle debridement, repair.  She states feeling better and nothing intolerable lately.    PAIN:  Are you having pain?   None now and only very mild in the past 2 weeks      PATIENT GOALS: To improve use of her right dominant arm  NEXT MD VISIT: Approximately 1 month no appointment has been made yet    OBJECTIVE: (All objective assessments below are from initial evaluation on: 03/13/23 unless otherwise specified.)   HAND DOMINANCE: Right   ADLs: Overall ADLs: States decreased ability to grab, hold household objects, pain and difficulty to open containers, perform FMS tasks (manipulate fasteners on clothing), mild to moderate bathing problems as well.    FUNCTIONAL OUTCOME MEASURES: 05/16/23: Quick DASH: 20%   03/20/23: Quick DASH 75% impairment today  (Higher % Score  =  More Impairment)     UPPER EXTREMITY ROM     Shoulder to Wrist AROM Right 03/20/23 Rt 04/29/23 Rt 05/16/23  Shoulder flexion     Shoulder abduction     Shoulder extension     Shoulder internal rotation     Shoulder external rotation     Elbow flexion 126 115 AROM (135 PROM) 140  Elbow extension 0 (-18) (-7)  Forearm supination 55  65  Forearm pronation  37  95  Wrist flexion 68 60 70  Wrist extension 50 50 72  (Blank rows = not tested)   Hand AROM Right 03/20/23  Full Fist Ability (or Gap to Distal Palmar Crease) Full but tight  Thumb Opposition  (Kapandji Scale)  10/10 but tight  (Blank rows = not tested)   UPPER EXTREMITY MMT:     MMT Right 04/29/23 Rt 05/16/23  Elbow flexion 4-/5 4+/5  Elbow extension 4-/5 4/5 slight  tender  Forearm supination 4/5 4/5 slight tender  Forearm pronation 4+/5 5/5  Wrist flexion 5/5 5/5  Wrist extension 4-/5 4/5  (Blank rows = not tested)  HAND FUNCTION: 05/16/23: Grip Rt: 40#   04/29/23: Rt grip: 30#   04/17/23: grip strength Rt: 27#,  Lt: 55#  COORDINATION: 04/29/23: BBT: 58 blocks today   04/17/23: Box and Blocks Test: Rt 35 Blocks today (57 is WFL)   SENSATION: 05/16/23: No complaints now   03/20/23:  States some tingling through back of hand and fingers, in radial nerve distribution, mostly.   Eval:  Light touch intact today, though she states some paresthesia in the right hand especially after waking up from her syncopal episode.   EDEMA:   05/16/23: None now    TODAY'S TREATMENT:  05/16/23: Pt performs AROM, gripping, and strength with Rt arm  against therapist's resistance for exercise/activities as well as new measures today. OT reviews her entire home exercise program and provides an updated copy of the necessary exercises to continue over the next month, and also discusses home and functional tasks with the pt and reviews goals.  She has little to no functional problems now compared to when she started, her strength is greatly improved, she has 40 pounds of grip strength and is coordinated.  She has a compressive elbow sleeve on to help her with slight lingering tenderness.  Pt states understanding and agrees to discharge today understanding what she should do going forward-she should use caution through 12 weeks postop and use a crawl, walk, run method to get back into all daily activities over the next 1 to 2 weeks.   Exercises reviewed today and finalized: - Tricep Stretch- DO SEATED BY TABLE  - 3-4 x daily - 3-5 reps - 15 hold - elbow flexion STRETCH  - 3-4 x daily - 3-5 reps - 15 sec hold - Fridge Door Stretch  - 4 x daily - 3-5 reps - 15 sec hold - Forearm Supination Stretch  - 3-4 x daily - 3-5 reps - 15 sec hold - Seated Wrist Flexion Stretch  - 3-4 x daily - 3 reps - 15 second  hold - Negative (eccentric) bicep strength   - 1-2 x daily - 3-5 x weekly - 1-2 sets - 5-10 reps - 10 sec hold - Seated Wrist Extension with Dumbbell  - 1-2 x daily - 3-5 x weekly - 1-2 sets - 5-15 reps - Standing Bicep Curls with  Resistance  - 1-2 x daily - 3-5 x weekly - 1-2 sets - 10-15 reps   PATIENT EDUCATION: Education details: See tx section above for details  Person educated: Patient Education method: Verbal Instruction, Teach back, Handouts  Education comprehension: States and demonstrates understanding   HOME EXERCISE PROGRAM: Access Code: [email or web address] URL: [email or web address] Date: 03/13/2023 Prepared by: Melvenia Ada   GOALS: Goals reviewed with patient? Yes   SHORT TERM GOALS: (STG required if POC>30 days) Target Date: 03/28/23  Pt will obtain protective, custom orthotic. Goal status: D/C  2.  Pt will demo/state understanding of initial HEP to improve pain levels and prerequisite motion. Goal status: 04/03/23: MET   LONG TERM GOALS: Target Date: 05/09/23  Pt will improve functional ability by decreased impairment per Quick DASH assessment from 75% to 25% or better, for better quality of life. Goal status: 05/16/23: MET 20%   2.  Pt will improve grip strength in right dominant hand to at least 40lbs for functional use at home and in IADLs. Goal status: 05/16/23: MET 40#  3.  Pt will improve A/ROM in right wrist flexion and extension from 68/50 to at least 65* each, to have functional motion for tasks like reach and grasp.  Goal status: 05/16/23: MET!  4.  Pt will improve strength in left elbow flexion and extension from 3-/5 MMT to at least 4+/5 MMT to have increased functional ability to carry out selfcare and higher-level homecare tasks with less difficulty. Goal status: 05/16/23: Partially MET 4+/5 in flexion, 4/5 in Extension (she will continue HEP to improve)   5.  Pt will improve coordination skills in right dominant arm, as seen by better score on box and blocks testing to have increased functional ability to carry out fine motor tasks (fasteners, etc.) and more complex, coordinated IADLs (meal prep, sports, etc.).  Goal status: 05/16/23: MET prior  6.  Pt will decrease  pain at rest from 6/10 to 2/10 or better to have better sleep and occupational participation in daily roles. Goal status: 05/16/23: MET   ASSESSMENT:  CLINICAL IMPRESSION: 05/16/23: She has now met all long-term goals with the exception of manual muscle testing which has nearly been met.  She agrees that she can continue to work on her stretches and her strengthening over the next month cautiously, and wearing a compressive elbow sleeve for support.  She should be able to return to all activities after 12 weeks postop as long as she does this in a nonaggressive fashion which was explained to her today.  We will discharge successfully   PLAN:  OT FREQUENCY: 1 additional visit on 05/16/23  OT DURATION: 1 additional week from 05/09/2023  - 05/16/23 to cover today's last visit   PLANNED INTERVENTIONS: 97168 OT Re-evaluation, 97535 self care/ADL training, 02889 therapeutic exercise, 97530 therapeutic activity, 97112 neuromuscular re-education, 97140 manual therapy, 97035 ultrasound, 97010 moist heat, 97010 cryotherapy, 97032 electrical stimulation (manual), 97760 Orthotics management and training, 02239 Splinting (initial encounter), [phone] Subsequent splinting/medication, scar mobilization, compression bandaging, psychosocial skills training, energy conservation, coping strategies training, and patient/family education  CONSULTED AND AGREED WITH PLAN OF CARE: Patient and family member/caregiver  PLAN FOR NEXT SESSION:   D/C today   Melvenia Ada, OTR/L, CHT 05/16/2023, 11:40 AM

## 2023-05-15 ENCOUNTER — Encounter: Payer: PRIVATE HEALTH INSURANCE | Admitting: Rehabilitative and Restorative Service Providers"

## 2023-05-16 ENCOUNTER — Ambulatory Visit (HOSPITAL_BASED_OUTPATIENT_CLINIC_OR_DEPARTMENT_OTHER): Payer: PRIVATE HEALTH INSURANCE | Admitting: Orthopaedic Surgery

## 2023-05-16 ENCOUNTER — Ambulatory Visit (INDEPENDENT_AMBULATORY_CARE_PROVIDER_SITE_OTHER): Payer: PRIVATE HEALTH INSURANCE | Admitting: Rehabilitative and Restorative Service Providers"

## 2023-05-16 ENCOUNTER — Encounter: Payer: Self-pay | Admitting: Rehabilitative and Restorative Service Providers"

## 2023-05-16 DIAGNOSIS — R600 Localized edema: Secondary | ICD-10-CM

## 2023-05-16 DIAGNOSIS — R278 Other lack of coordination: Secondary | ICD-10-CM | POA: Diagnosis not present

## 2023-05-16 DIAGNOSIS — M25521 Pain in right elbow: Secondary | ICD-10-CM

## 2023-05-16 DIAGNOSIS — M7711 Lateral epicondylitis, right elbow: Secondary | ICD-10-CM

## 2023-05-16 DIAGNOSIS — M25621 Stiffness of right elbow, not elsewhere classified: Secondary | ICD-10-CM

## 2023-05-16 DIAGNOSIS — R202 Paresthesia of skin: Secondary | ICD-10-CM

## 2023-05-16 DIAGNOSIS — M79601 Pain in right arm: Secondary | ICD-10-CM | POA: Diagnosis not present

## 2023-05-16 DIAGNOSIS — M6281 Muscle weakness (generalized): Secondary | ICD-10-CM

## 2023-05-16 NOTE — Progress Notes (Signed)
 Post Operative Evaluation    Procedure/Date of Surgery: Right lateral epicondyle debridement and repair 10/14  Interval History:    Presents today 12 weeks status post the above procedure.  Overall she is doing very well.  At this time the right elbow has dramatically improved over the last several weeks with occupational therapy.  PMH/PSH/Family History/Social History/Meds/Allergies:    Past Medical History:  Diagnosis Date   Anxiety    Back problem    Bursitis of shoulder, right    Depression    Elevated hemoglobin A1c 2017   Hypertension    Low vitamin D  level    Migraines    Uterine fibroid    Past Surgical History:  Procedure Laterality Date   ABDOMINAL HYSTERECTOMY  2010   TAH--Dr. Nikki   MYOMECTOMY  2006   -Dr. Nikki KRAS ELBOW RELEASE/NIRSCHEL PROCEDURE Right 02/24/2023   Procedure: Right elbow lateral epicondyle debridement and repair;  Surgeon: Genelle Standing, MD;  Location: [facility] ORS;  Service: Orthopedics;  Laterality: Right;   TUBAL LIGATION  2002   -Dr. Nikki   Social History   Socioeconomic History   Marital status: Married    Spouse name: Not on file   Number of children: 1   Years of education: Not on file   Highest education level: Not on file  Occupational History   Not on file  Tobacco Use   Smoking status: Never   Smokeless tobacco: Never  Vaping Use   Vaping status: Never Used  Substance and Sexual Activity   Alcohol use: Yes    Alcohol/week: 1.0 standard drink of alcohol    Types: 1 Glasses of wine per week    Comment: occ   Drug use: No   Sexual activity: Yes    Partners: Male    Birth control/protection: Surgical    Comment: TAH  Other Topics Concern   Not on file  Social History Narrative   Not on file   Social Drivers of Health   Financial Resource Strain: Not on file  Food Insecurity: Not on file  Transportation Needs: Not on file  Physical Activity: Not on file  Stress: Not on  file  Social Connections: Not on file   Family History  Adopted: Yes  Problem Relation Age of Onset   Breast cancer Neg Hx    No Known Allergies Current Outpatient Medications  Medication Sig Dispense Refill   ALPRAZolam  (XANAX ) 1 MG tablet TAKE 1/2 TABLET TO 1 TABLET TWICE DAILY AS NEEDED USE SPARINGLY FOR ANXIETY 45 tablet 2   amLODipine  (NORVASC ) 5 MG tablet Take 1 tablet (5 mg total) by mouth daily. 90 tablet 1   KLOR-CON  M10 10 MEQ tablet TAKE 1 TABLET (10 MEQ TOTAL) BY MOUTH DAILY FOR 15 DAYS. 90 tablet 1   ondansetron  (ZOFRAN ) 8 MG tablet TAKE 1 TABLET BY MOUTH EVERY 8 HOURS AS NEEDED FOR NAUSEA 15 tablet 0   Current Facility-Administered Medications  Medication Dose Route Frequency Provider Last Rate Last Admin   0.9 %  sodium chloride  infusion  500 mL Intravenous Once Danis, Henry L III, MD       No results found.  Review of Systems:   A ROS was performed including pertinent positives and negatives as documented in the HPI.   Musculoskeletal Exam:  There were no vitals taken for this visit.  Right lateral epicondylar incision is well-appearing without erythema or drainage.  Fires extensors of all of her fingers as well as the extensor of the thumb as well as flexors.  Sensation intact in all distributions of the right hand  Imaging:      I personally reviewed and interpreted the radiographs.   Assessment:   12-week status post right lateral epicondyle debridement repair doing very well.  She does feel more as though she has normalized in terms of her elbow and is dramatically improved.  I will plan to see her back as needed Plan :    -Return to clinic as needed     I personally saw and evaluated the patient, and participated in the management and treatment plan.  Elspeth Parker, MD Attending Physician, Orthopedic Surgery  This document was dictated using Dragon voice recognition software. A reasonable attempt at proof reading has been made to minimize  errors.

## 2023-05-29 NOTE — Progress Notes (Signed)
53 y.o. G40P1001 Married [place] American female here for annual exam.    No GYN concerns.   Some night sweats.  Managing ok. Not seeking tx.   Searching for her biological parents.  Her mother may have a hx of cancer.  PCP: Babs Sciara, MD   No LMP recorded (lmp unknown). Patient has had a hysterectomy.           Sexually active: Yes.    The current method of family planning is status post hysterectomy.    Menopausal hormone therapy:  n/a Exercising: Yes.     walking Smoker:  no  OB History  Gravida Para Term Preterm AB Living  1 1 1   1   SAB IAB Ectopic Multiple Live Births          # Outcome Date GA Lbr Len/2nd Weight Sex Type Anes PTL Lv  1 Term              HEALTH MAINTENANCE: Last 2 paps:  2014 neg History of abnormal Pap or positive HPV:  yes Mammogram:   08/15/22 Breast Density cat C, BI-RADS CAT 1 neg Colonoscopy:  04/28/17- due in 2028 Bone Density:  n/a  Result  n/a   Immunization History  Administered Date(s) Administered   Influenza,inj,Quad PF,6+ Mos 04/10/2018, 03/20/2019, 02/19/2020, 02/22/2021, 02/20/2022   Moderna SARS-COV2 Booster Vaccination 10/21/2020   Moderna Sars-Covid-2 Vaccination 07/19/2019, 08/16/2019, 03/17/2020   Tdap 07/15/2013      reports that she has never smoked. She has never used smokeless tobacco. She reports current alcohol use of about 1.0 standard drink of alcohol per week. She reports that she does not use drugs.  Past Medical History:  Diagnosis Date   Anxiety    Back problem    Bursitis of shoulder, right    Depression    Elevated hemoglobin A1c 2017   Hypertension    Low vitamin D level    Migraines    Uterine fibroid     Past Surgical History:  Procedure Laterality Date   ABDOMINAL HYSTERECTOMY  2010   TAH--Dr. Edward Jolly   MYOMECTOMY  2006   -Dr. Jamelle Haring ELBOW RELEASE/NIRSCHEL PROCEDURE Right 02/24/2023   Procedure: Right elbow lateral epicondyle debridement and repair;  Surgeon: Huel Cote, MD;  Location: [facility] ORS;  Service: Orthopedics;  Laterality: Right;   TUBAL LIGATION  2002   -Dr. Edward Jolly    Current Outpatient Medications  Medication Sig Dispense Refill   ALPRAZolam (XANAX) 1 MG tablet TAKE 1/2 TABLET TO 1 TABLET TWICE DAILY AS NEEDED USE SPARINGLY FOR ANXIETY 45 tablet 2   amLODipine (NORVASC) 5 MG tablet Take 1 tablet (5 mg total) by mouth daily. 90 tablet 1   ondansetron (ZOFRAN) 8 MG tablet TAKE 1 TABLET BY MOUTH EVERY 8 HOURS AS NEEDED FOR NAUSEA 15 tablet 0   potassium chloride (KLOR-CON M10) 10 MEQ tablet Take 1 tablet (10 mEq total) by mouth once for 1 dose. 90 tablet 1   Current Facility-Administered Medications  Medication Dose Route Frequency Provider Last Rate Last Admin   0.9 %  sodium chloride infusion  500 mL Intravenous Once Sherrilyn Rist, MD        ALLERGIES: Patient has no known allergies.  Family History  Adopted: Yes  Problem Relation Age of Onset   Breast cancer Neg Hx     Review of Systems  All other systems reviewed and are negative.   PHYSICAL EXAM:  BP 124/82 (BP  Location: Left Arm, Patient Position: Sitting, Cuff Size: Small)   Pulse (!) 101   Ht 5' 6.25" (1.683 m)   Wt 201 lb (91.2 kg)   LMP  (LMP Unknown)   SpO2 100%   BMI 32.20 kg/m     General appearance: alert, cooperative and appears stated age Head: normocephalic, without obvious abnormality, atraumatic Neck: no adenopathy, supple, symmetrical, trachea midline and thyroid normal to inspection and palpation Lungs: clear to auscultation bilaterally Breasts: normal appearance, no masses or tenderness, No nipple retraction or dimpling, No nipple discharge or bleeding, No axillary adenopathy Heart: regular rate and rhythm Abdomen: soft, non-tender; no masses, no organomegaly Extremities: extremities normal, atraumatic, no cyanosis or edema Skin: skin color, texture, turgor normal. No rashes or lesions Lymph nodes: cervical, supraclavicular, and axillary nodes  normal. Neurologic: grossly normal  Pelvic: External genitalia:  no lesions              No abnormal inguinal nodes palpated.              Urethra:  normal appearing urethra with no masses, tenderness or lesions              Bartholins and Skenes: normal                 Vagina: normal appearing vagina with normal color and discharge, no lesions              Cervix: absent              Pap taken: No. Bimanual Exam:  Uterus:  absent              Adnexa: no mass, fullness, tenderness              Rectal exam: Yes.  .  Confirms.              Anus:  normal sphincter tone, no lesions  Chaperone was present for exam:  Warren Lacy, CMA  ASSESSMENT: Well woman visit with gynecologic exam Status post TAH. Remote history of abnormal pap. Hx low vit D.  HTN. Elevated A1C.   PHQ2:  0 Adopted.  Possible family history of cancer.   PLAN: Mammogram screening discussed. Self breast awareness reviewed. Pap and HRV collected:  No. Guidelines for Calcium, Vitamin D, regular exercise program including cardiovascular and weight bearing exercise. Medication refills:  NA We discussed cancer screening and potential referral to for genetic counseling and testing depending on family history obtained.  Doing routine labs with PCP. Follow up:  yearly and prn.

## 2023-06-01 ENCOUNTER — Other Ambulatory Visit: Payer: Self-pay | Admitting: Family Medicine

## 2023-06-02 ENCOUNTER — Other Ambulatory Visit: Payer: Self-pay

## 2023-06-02 DIAGNOSIS — E876 Hypokalemia: Secondary | ICD-10-CM

## 2023-06-02 MED ORDER — POTASSIUM CHLORIDE CRYS ER 10 MEQ PO TBCR: 10.0000 meq | EXTENDED_RELEASE_TABLET | Freq: Once | ORAL | 1 refills | Status: DC

## 2023-06-12 ENCOUNTER — Ambulatory Visit (INDEPENDENT_AMBULATORY_CARE_PROVIDER_SITE_OTHER): Payer: No Typology Code available for payment source | Admitting: Obstetrics and Gynecology

## 2023-06-12 ENCOUNTER — Encounter: Payer: Self-pay | Admitting: Obstetrics and Gynecology

## 2023-06-12 VITALS — BP 124/82 | HR 101 | Ht 66.25 in | Wt 201.0 lb

## 2023-06-12 DIAGNOSIS — Z01419 Encounter for gynecological examination (general) (routine) without abnormal findings: Secondary | ICD-10-CM | POA: Diagnosis not present

## 2023-06-12 DIAGNOSIS — Z1331 Encounter for screening for depression: Secondary | ICD-10-CM | POA: Diagnosis not present

## 2023-06-12 NOTE — Patient Instructions (Signed)

## 2023-07-09 ENCOUNTER — Encounter: Payer: Self-pay | Admitting: Family Medicine

## 2023-07-09 ENCOUNTER — Other Ambulatory Visit: Payer: Self-pay | Admitting: Family Medicine

## 2023-07-09 ENCOUNTER — Ambulatory Visit: Payer: No Typology Code available for payment source | Admitting: Family Medicine

## 2023-07-09 VITALS — BP 129/85 | HR 78 | Temp 98.2°F | Ht 66.25 in | Wt 194.0 lb

## 2023-07-09 DIAGNOSIS — R7303 Prediabetes: Secondary | ICD-10-CM | POA: Diagnosis not present

## 2023-07-09 DIAGNOSIS — Z23 Encounter for immunization: Secondary | ICD-10-CM | POA: Diagnosis not present

## 2023-07-09 DIAGNOSIS — E876 Hypokalemia: Secondary | ICD-10-CM | POA: Diagnosis not present

## 2023-07-09 DIAGNOSIS — I10 Essential (primary) hypertension: Secondary | ICD-10-CM | POA: Diagnosis not present

## 2023-07-09 DIAGNOSIS — Z1231 Encounter for screening mammogram for malignant neoplasm of breast: Secondary | ICD-10-CM

## 2023-07-09 DIAGNOSIS — F419 Anxiety disorder, unspecified: Secondary | ICD-10-CM

## 2023-07-09 MED ORDER — POTASSIUM CHLORIDE CRYS ER 10 MEQ PO TBCR
10.0000 meq | EXTENDED_RELEASE_TABLET | Freq: Once | ORAL | 1 refills | Status: DC
Start: 2023-07-09 — End: 2024-03-08

## 2023-07-09 MED ORDER — AMLODIPINE BESYLATE 5 MG PO TABS
5.0000 mg | ORAL_TABLET | Freq: Every day | ORAL | 1 refills | Status: DC
Start: 2023-07-09 — End: 2024-03-03

## 2023-07-09 MED ORDER — ALPRAZOLAM 1 MG PO TABS
ORAL_TABLET | ORAL | 5 refills | Status: DC
Start: 2023-07-09 — End: 2024-03-03

## 2023-07-09 NOTE — Progress Notes (Signed)
   Subjective:    Patient ID: Carrie Gordon, female    DOB: 11/23/1970, 53 y.o.   MRN: [id number]  HPI Patient here for follow-up Doing a good job taking blood pressure medicine Trying to watch diet closely Staying physically active  Has underlying anxiety has had it for years uses Xanax to help with sleep at night and takes an occasional 1:30 during the day denies abusing it denies drowsiness with the medicine Denies being depressed  Patient does take her potassium on a regular basis denies any fatigue tiredness  Review of Systems     Objective:   Physical Exam  General-in no acute distress Eyes-no discharge Lungs-respiratory rate normal, CTA CV-no murmurs,RRR Extremities skin warm dry no edema Neuro grossly normal Behavior normal, alert       Assessment & Plan:  1. Prediabetes (Primary) Patient is frustrated by her weight gain but she has lost a few pounds Watch diet closely she she will check lab work in the near future continue healthy diet - Hemoglobin A1c  2. Primary hypertension Pressure good control continue current medications - amLODipine (NORVASC) 5 MG tablet; Take 1 tablet (5 mg total) by mouth daily.  Dispense: 90 tablet; Refill: 1  3. Anxiety Xanax sparingly during the day caution drowsiness - ALPRAZolam (XANAX) 1 MG tablet; TAKE 1/2 TABLET TO 1 TABLET TWICE DAILY AS NEEDED USE SPARINGLY FOR ANXIETY  Dispense: 45 tablet; Refill: 5  4. Hypokalemia Continue potassium currently - potassium chloride (KLOR-CON M10) 10 MEQ tablet; Take 1 tablet (10 mEq total) by mouth once for 1 dose.  Dispense: 90 tablet; Refill: 1  5. Immunization due Flu shot today - Flu vaccine trivalent PF, 6mos and older(Flulaval,Afluria,Fluarix,Fluzone) Follow-up 6 months Insomnia-Xanax at nighttime as needed

## 2023-07-10 ENCOUNTER — Encounter (INDEPENDENT_AMBULATORY_CARE_PROVIDER_SITE_OTHER): Payer: Self-pay | Admitting: *Deleted

## 2023-07-11 ENCOUNTER — Encounter: Payer: Self-pay | Admitting: Family Medicine

## 2023-07-11 LAB — HEMOGLOBIN A1C
Est. average glucose Bld gHb Est-mCnc: 137 mg/dL
Hgb A1c MFr Bld: 6.4 % — ABNORMAL HIGH (ref 4.8–5.6)

## 2023-08-21 ENCOUNTER — Ambulatory Visit
Admission: RE | Admit: 2023-08-21 | Discharge: 2023-08-21 | Disposition: A | Discharge status: Home / Self Care | Payer: PRIVATE HEALTH INSURANCE | Source: Ambulatory Visit | Attending: Family Medicine | Admitting: Family Medicine

## 2023-08-21 DIAGNOSIS — Z1231 Encounter for screening mammogram for malignant neoplasm of breast: Secondary | ICD-10-CM

## 2024-01-06 ENCOUNTER — Ambulatory Visit: Payer: PRIVATE HEALTH INSURANCE | Admitting: Family Medicine

## 2024-03-02 ENCOUNTER — Encounter: Payer: Self-pay | Admitting: Family Medicine

## 2024-03-02 DIAGNOSIS — R5383 Other fatigue: Secondary | ICD-10-CM

## 2024-03-02 DIAGNOSIS — I10 Essential (primary) hypertension: Secondary | ICD-10-CM

## 2024-03-02 DIAGNOSIS — E785 Hyperlipidemia, unspecified: Secondary | ICD-10-CM

## 2024-03-02 DIAGNOSIS — R7303 Prediabetes: Secondary | ICD-10-CM

## 2024-03-02 DIAGNOSIS — R739 Hyperglycemia, unspecified: Secondary | ICD-10-CM

## 2024-03-02 DIAGNOSIS — Z79899 Other long term (current) drug therapy: Secondary | ICD-10-CM

## 2024-03-03 ENCOUNTER — Other Ambulatory Visit: Payer: Self-pay | Admitting: Family Medicine

## 2024-03-03 DIAGNOSIS — F419 Anxiety disorder, unspecified: Secondary | ICD-10-CM

## 2024-03-03 DIAGNOSIS — I10 Essential (primary) hypertension: Secondary | ICD-10-CM

## 2024-03-03 DIAGNOSIS — E876 Hypokalemia: Secondary | ICD-10-CM

## 2024-03-03 MED ORDER — ONDANSETRON HCL 8 MG PO TABS: ORAL_TABLET | ORAL | 0 refills | Status: DC

## 2024-03-03 MED ORDER — ALPRAZOLAM 1 MG PO TABS: ORAL_TABLET | ORAL | 0 refills | Status: DC

## 2024-03-03 MED ORDER — AMLODIPINE BESYLATE 5 MG PO TABS: 5.0000 mg | ORAL_TABLET | Freq: Every day | ORAL | 0 refills | Status: DC

## 2024-03-03 NOTE — Telephone Encounter (Signed)
 Nurses Please order lipid, liver, metabolic 7, urine ACR, A1c, vitamin D   Prediabetes, hypertension, hyperlipidemia, high risk med, vitamin D  deficiency I did send in a copy of her medications that will help cover her over the course of the next 90 days Also have the front help schedule Heron with follow-up visit with myself or one of the other providers if my schedule is full somewhere in the next 90 days thank you  Also you may send a copy this message to [facility] refilled your medicines-are you currently taking potassium daily?  If so we can order the potassium-I could not tell from the electronic record.  Also our staff will work with you to try to get you in with myself or one of the other providers somewhere in the next 90 days-you are due for a follow-up  Please take care-Dr. Glendia

## 2024-03-06 ENCOUNTER — Other Ambulatory Visit: Payer: Self-pay | Admitting: Family Medicine

## 2024-03-06 DIAGNOSIS — E876 Hypokalemia: Secondary | ICD-10-CM

## 2024-03-06 LAB — LAB REPORT - SCANNED
A1c: 5.9
Albumin, Urine POC: <3
Creatinine, POC: 33.5 mg/dL
EGFR: 70
Microalb Creat Ratio: <9

## 2024-03-06 LAB — VITAMIN D 25 HYDROXY (VIT D DEFICIENCY, FRACTURES)
A1c: 5.9
Albumin, Urine POC: <3
Albumin/Creatinine Ratio, Urine, POC: <9
Creatinine, POC: 33.5 mg/dL
EGFR: 70

## 2024-03-14 ENCOUNTER — Telehealth: Payer: Self-pay | Admitting: Family Medicine

## 2024-03-14 NOTE — Telephone Encounter (Signed)
 Patient had recent lab work Lab work shows HDL 65 LDL 146 total 222 liver enzymes normal creatinine 0.96 sodium 139 potassium 3.9 vitamin D  57.6 A1c 5 point  Patient has a follow-up office visit with Carrie Gordon  a few days

## 2024-03-15 ENCOUNTER — Encounter: Payer: Self-pay | Admitting: Radiology

## 2024-03-17 ENCOUNTER — Telehealth: Payer: Self-pay | Admitting: *Deleted

## 2024-03-17 NOTE — Telephone Encounter (Signed)
 Copied from CRM #[id number]. Topic: Clinical - Lab/Test Results >> Mar 12, 2024 12:05 PM Carrie Gordon wrote: Reason for CRM: Patient is calling in wanting to know if Dr. Alphonsa had received her lab results. Patient says they were faxed over on Monday 03/08/24. Please follow up with patient.

## 2024-03-17 NOTE — Telephone Encounter (Signed)
 A detailed MyChart message has been sent on this patient

## 2024-03-23 ENCOUNTER — Ambulatory Visit (INDEPENDENT_AMBULATORY_CARE_PROVIDER_SITE_OTHER): Payer: PRIVATE HEALTH INSURANCE | Admitting: Nurse Practitioner

## 2024-03-23 VITALS — BP 133/87 | Ht 66.25 in | Wt 186.0 lb

## 2024-03-23 DIAGNOSIS — E876 Hypokalemia: Secondary | ICD-10-CM

## 2024-03-23 DIAGNOSIS — G4700 Insomnia, unspecified: Secondary | ICD-10-CM

## 2024-03-23 DIAGNOSIS — I10 Essential (primary) hypertension: Secondary | ICD-10-CM

## 2024-03-23 NOTE — Progress Notes (Signed)
 Subjective:    Patient ID: Carrie Gordon, female    DOB: 1970-06-08, 53 y.o.   MRN: [id number] CC: follow-up visit  HPI 53 year old, female, arrives for a follow up visit on her medications. She does not report any concerns or side effects from medications. She is take the prescribed xanax  about twice monthly. She does not check her BP at home but would like to start doing so to gage if she can d/c her amlodipine  in the future. She reports walking 2 miles daily and cutting back on her food intake. In addition, she is sleeping 7/8 hours nightly with the assistance of benadryl nightly. She started a new job about a month ago and is trying to trying to find a routine. Patient has been taking her Klor-con  as prescribed. Labs done through [facility]. See labs in Epic.   Review of Systems  Constitutional:  Negative for activity change and appetite change.  Respiratory:  Negative for cough, shortness of breath and wheezing.   Cardiovascular:  Negative for chest pain and leg swelling.  Gastrointestinal:  Negative for abdominal pain, constipation, diarrhea, nausea and vomiting.  Genitourinary:  Negative for frequency and urgency.          03/23/2024   10:30 AM 07/09/2023    8:48 AM 03/24/2023    3:30 PM 01/06/2023    9:27 AM  GAD 7 : Generalized Anxiety Score  Nervous, Anxious, on Edge 0 1 2 2   Control/stop worrying 2 2 1 2   Worry too much - different things 2 2 2 2   Trouble relaxing 2 2 2 2   Restless 1 1 1  0  Easily annoyed or irritable 1 2 2 2   Afraid - awful might happen 1 2 1 2   Total GAD 7 Score 9 12 11 12   Anxiety Difficulty Not difficult at all Not difficult at all Somewhat difficult Not difficult at all        03/23/2024   10:29 AM 07/09/2023    8:48 AM 06/12/2023    8:03 AM  Depression screen PHQ 2/9  Decreased Interest 1 1 0  Down, Depressed, Hopeless 1 1 0  PHQ - 2 Score 2 2 0  Altered sleeping 3 2   Tired, decreased energy 1 1   Change in appetite 0 2    Feeling bad or failure about yourself  0 2   Trouble concentrating  1   Moving slowly or fidgety/restless 0 0   Suicidal thoughts 0 0   PHQ-9 Score 6 10    Difficult doing work/chores Not difficult at all Not difficult at all      Data saved with a previous flowsheet row definition     Objective:   Physical Exam Vitals and nursing note reviewed.  Constitutional:      General: She is not in acute distress.    Appearance: Normal appearance.  Cardiovascular:     Rate and Rhythm: Normal rate and regular rhythm.     Heart sounds: Normal heart sounds.  Pulmonary:     Effort: Pulmonary effort is normal.     Breath sounds: Normal breath sounds.  Musculoskeletal:     Right lower leg: No edema.     Left lower leg: No edema.  Skin:    General: Skin is warm and dry.  Neurological:     Mental Status: She is alert and oriented to person, place, and time.  Psychiatric:        Mood and  Affect: Mood normal.        Behavior: Behavior normal.        Thought Content: Thought content normal.    Vitals:   03/23/24 1029  BP: 133/87  Height: 5' 6.25 (1.683 m)  Weight: 84.4 kg  BMI (Calculated): 29.79    eGFR 70. Potassium 3.9.      Assessment & Plan:  1. Primary hypertension (Primary) Continue to take medication as prescribed. Begin to take a daily log of BP at home and at different times of the day. Send the log via MyChart or bring log to the next followup visit.  Trending weight assessed. Continue to partake in physical exercise and engage in healthy diet habits.  Contact the office with any concerns for dizziness or lightheadedness.  2. Insomnia, unspecified type Develop a nightly sleep routine. Continue Benadryl as directed prn.  Defers medication for anxiety or depression.   3. Hypokalemia Labs reviewed.  Continue to take medication as prescribed.  Reminded patient about preventive health physical.   Return in about 6 months (around 09/20/2024).

## 2024-03-27 ENCOUNTER — Encounter: Payer: Self-pay | Admitting: Nurse Practitioner

## 2024-04-01 ENCOUNTER — Ambulatory Visit: Payer: Self-pay | Admitting: Nurse Practitioner

## 2024-04-18 ENCOUNTER — Encounter (HOSPITAL_BASED_OUTPATIENT_CLINIC_OR_DEPARTMENT_OTHER): Payer: Self-pay

## 2024-04-18 ENCOUNTER — Other Ambulatory Visit: Payer: Self-pay

## 2024-04-18 ENCOUNTER — Emergency Department (HOSPITAL_BASED_OUTPATIENT_CLINIC_OR_DEPARTMENT_OTHER)
Admission: EM | Admit: 2024-04-18 | Discharge: 2024-04-18 | Disposition: A | Discharge status: Home / Self Care | Payer: Self-pay

## 2024-04-18 DIAGNOSIS — M546 Pain in thoracic spine: Secondary | ICD-10-CM | POA: Diagnosis present

## 2024-04-18 DIAGNOSIS — Y92410 Unspecified street and highway as the place of occurrence of the external cause: Secondary | ICD-10-CM | POA: Diagnosis not present

## 2024-04-18 DIAGNOSIS — M7918 Myalgia, other site: Secondary | ICD-10-CM

## 2024-04-18 DIAGNOSIS — M791 Myalgia, unspecified site: Secondary | ICD-10-CM | POA: Diagnosis not present

## 2024-04-18 MED ORDER — IBUPROFEN 600 MG PO TABS: 600.0000 mg | ORAL_TABLET | Freq: Four times a day (QID) | ORAL | 0 refills | Status: AC | PRN

## 2024-04-18 MED ORDER — IBUPROFEN 600 MG PO TABS: 600.0000 mg | ORAL_TABLET | Freq: Four times a day (QID) | ORAL | 0 refills | Status: DC | PRN

## 2024-04-18 MED ORDER — METHOCARBAMOL 750 MG PO TABS: 750.0000 mg | ORAL_TABLET | Freq: Two times a day (BID) | ORAL | 0 refills | Status: DC

## 2024-04-18 NOTE — Discharge Instructions (Signed)
 As we discussed, your injuries are felt to be musculoskeletal strain type injuries. Take the medications for comfort as directed. Follow up with your doctor if needed for recheck.
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
## 2024-04-19 ENCOUNTER — Encounter: Payer: Self-pay | Admitting: Nurse Practitioner

## 2024-04-19 ENCOUNTER — Other Ambulatory Visit: Payer: Self-pay | Admitting: Nurse Practitioner

## 2024-04-19 DIAGNOSIS — R7303 Prediabetes: Secondary | ICD-10-CM | POA: Insufficient documentation

## 2024-04-19 NOTE — Progress Notes (Signed)
 SABRA

## 2024-05-07 ENCOUNTER — Ambulatory Visit: Payer: PRIVATE HEALTH INSURANCE | Admitting: Family Medicine

## 2024-05-07 VITALS — BP 127/86 | HR 94 | Temp 98.4°F | Ht 66.25 in | Wt 188.6 lb

## 2024-05-07 DIAGNOSIS — F419 Anxiety disorder, unspecified: Secondary | ICD-10-CM | POA: Diagnosis not present

## 2024-05-07 DIAGNOSIS — I10 Essential (primary) hypertension: Secondary | ICD-10-CM

## 2024-05-07 MED ORDER — CITALOPRAM HYDROBROMIDE 20 MG PO TABS: 20.0000 mg | ORAL_TABLET | Freq: Every day | ORAL | 1 refills | Status: AC

## 2024-05-07 MED ORDER — ALPRAZOLAM 1 MG PO TABS: ORAL_TABLET | ORAL | 2 refills | Status: AC
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
## 2024-05-08 MED ORDER — AMLODIPINE BESYLATE 5 MG PO TABS: 5.0000 mg | ORAL_TABLET | Freq: Every day | ORAL | 2 refills | Status: AC

## 2024-06-15 ENCOUNTER — Ambulatory Visit: Payer: No Typology Code available for payment source | Admitting: Obstetrics and Gynecology

## 2024-07-26 ENCOUNTER — Ambulatory Visit: Admitting: Obstetrics and Gynecology

## 2024-09-23 ENCOUNTER — Ambulatory Visit: Admitting: Family Medicine

## 2024-11-04 ENCOUNTER — Ambulatory Visit: Payer: PRIVATE HEALTH INSURANCE | Admitting: Family Medicine
